# Patient Record
Sex: Female | Born: 1935 | Race: White | Hispanic: No | Smoking: Former smoker
Health system: Southern US, Community
[De-identification: ages and names within clinical notes are randomized; demographics above are authoritative.]

## PROBLEM LIST (undated history)

## (undated) DIAGNOSIS — N950 Postmenopausal bleeding: Secondary | ICD-10-CM

## (undated) DIAGNOSIS — C4491 Basal cell carcinoma of skin, unspecified: Secondary | ICD-10-CM

## (undated) DIAGNOSIS — I272 Pulmonary hypertension, unspecified: Secondary | ICD-10-CM

## (undated) DIAGNOSIS — G4700 Insomnia, unspecified: Secondary | ICD-10-CM

## (undated) DIAGNOSIS — N952 Postmenopausal atrophic vaginitis: Secondary | ICD-10-CM

## (undated) DIAGNOSIS — G629 Polyneuropathy, unspecified: Secondary | ICD-10-CM

## (undated) DIAGNOSIS — H9190 Unspecified hearing loss, unspecified ear: Secondary | ICD-10-CM

## (undated) DIAGNOSIS — K5900 Constipation, unspecified: Secondary | ICD-10-CM

## (undated) DIAGNOSIS — K219 Gastro-esophageal reflux disease without esophagitis: Secondary | ICD-10-CM

## (undated) DIAGNOSIS — R3129 Other microscopic hematuria: Secondary | ICD-10-CM

## (undated) DIAGNOSIS — N200 Calculus of kidney: Secondary | ICD-10-CM

## (undated) DIAGNOSIS — K449 Diaphragmatic hernia without obstruction or gangrene: Secondary | ICD-10-CM

## (undated) DIAGNOSIS — G1221 Amyotrophic lateral sclerosis: Secondary | ICD-10-CM

## (undated) DIAGNOSIS — E785 Hyperlipidemia, unspecified: Secondary | ICD-10-CM

## (undated) DIAGNOSIS — H3530 Unspecified macular degeneration: Secondary | ICD-10-CM

## (undated) DIAGNOSIS — E538 Deficiency of other specified B group vitamins: Secondary | ICD-10-CM

## (undated) DIAGNOSIS — F419 Anxiety disorder, unspecified: Secondary | ICD-10-CM

## (undated) DIAGNOSIS — I10 Essential (primary) hypertension: Secondary | ICD-10-CM

## (undated) DIAGNOSIS — S82201A Unspecified fracture of shaft of right tibia, initial encounter for closed fracture: Secondary | ICD-10-CM

## (undated) DIAGNOSIS — N3281 Overactive bladder: Secondary | ICD-10-CM

## (undated) DIAGNOSIS — Z79890 Hormone replacement therapy (postmenopausal): Secondary | ICD-10-CM

## (undated) DIAGNOSIS — K5790 Diverticulosis of intestine, part unspecified, without perforation or abscess without bleeding: Secondary | ICD-10-CM

## (undated) DIAGNOSIS — T7840XA Allergy, unspecified, initial encounter: Secondary | ICD-10-CM

## (undated) DIAGNOSIS — I340 Nonrheumatic mitral (valve) insufficiency: Secondary | ICD-10-CM

## (undated) HISTORY — DX: Postmenopausal atrophic vaginitis: N95.2

## (undated) HISTORY — DX: Diaphragmatic hernia without obstruction or gangrene: K44.9

## (undated) HISTORY — DX: Gastro-esophageal reflux disease without esophagitis: K21.9

## (undated) HISTORY — DX: Unspecified macular degeneration: H35.30

## (undated) HISTORY — DX: Allergy, unspecified, initial encounter: T78.40XA

## (undated) HISTORY — DX: Calculus of kidney: N20.0

## (undated) HISTORY — DX: Nonrheumatic mitral (valve) insufficiency: I34.0

## (undated) HISTORY — DX: Basal cell carcinoma of skin, unspecified: C44.91

## (undated) HISTORY — DX: Essential (primary) hypertension: I10

## (undated) HISTORY — DX: Diverticulosis of intestine, part unspecified, without perforation or abscess without bleeding: K57.90

## (undated) HISTORY — DX: Deficiency of other specified B group vitamins: E53.8

## (undated) HISTORY — DX: Pulmonary hypertension, unspecified: I27.20

## (undated) HISTORY — DX: Polyneuropathy, unspecified: G62.9

## (undated) HISTORY — DX: Constipation, unspecified: K59.00

## (undated) HISTORY — DX: Anxiety disorder, unspecified: F41.9

## (undated) HISTORY — DX: Insomnia, unspecified: G47.00

## (undated) HISTORY — DX: Other microscopic hematuria: R31.29

## (undated) HISTORY — DX: Postmenopausal bleeding: N95.0

## (undated) HISTORY — DX: Overactive bladder: N32.81

## (undated) HISTORY — DX: Hyperlipidemia, unspecified: E78.5

## (undated) HISTORY — DX: Unspecified hearing loss, unspecified ear: H91.90

## (undated) HISTORY — PX: TONSILLECTOMY AND ADENOIDECTOMY: SUR1326

## (undated) HISTORY — DX: Unspecified fracture of shaft of right tibia, initial encounter for closed fracture: S82.201A

## (undated) HISTORY — DX: Hormone replacement therapy: Z79.890

## (undated) HISTORY — DX: Amyotrophic lateral sclerosis: G12.21

---
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
## 1991-06-16 DIAGNOSIS — N200 Calculus of kidney: Secondary | ICD-10-CM

## 1991-06-16 HISTORY — DX: Calculus of kidney: N20.0

## 1999-12-24 ENCOUNTER — Encounter: Payer: Self-pay | Admitting: Family Medicine

## 1999-12-24 ENCOUNTER — Encounter: Admission: RE | Admit: 1999-12-24 | Discharge: 1999-12-24 | Payer: Self-pay | Admitting: Family Medicine

## 2001-11-05 ENCOUNTER — Encounter: Payer: Self-pay | Admitting: Emergency Medicine

## 2001-11-05 ENCOUNTER — Emergency Department (HOSPITAL_COMMUNITY): Admission: EM | Admit: 2001-11-05 | Discharge: 2001-11-05 | Payer: Self-pay | Admitting: Emergency Medicine

## 2002-06-28 ENCOUNTER — Ambulatory Visit (HOSPITAL_COMMUNITY): Admission: RE | Admit: 2002-06-28 | Discharge: 2002-06-28 | Payer: Self-pay | Admitting: Gastroenterology

## 2003-12-18 ENCOUNTER — Encounter: Admission: RE | Admit: 2003-12-18 | Discharge: 2003-12-18 | Payer: Self-pay | Admitting: Family Medicine

## 2004-03-06 ENCOUNTER — Other Ambulatory Visit: Admission: RE | Admit: 2004-03-06 | Discharge: 2004-03-06 | Payer: Self-pay | Admitting: Family Medicine

## 2006-08-10 ENCOUNTER — Encounter: Admission: RE | Admit: 2006-08-10 | Discharge: 2006-08-10 | Payer: Self-pay | Admitting: Family Medicine

## 2006-08-26 ENCOUNTER — Encounter: Admission: RE | Admit: 2006-08-26 | Discharge: 2006-08-26 | Payer: Self-pay | Admitting: Family Medicine

## 2006-09-30 ENCOUNTER — Other Ambulatory Visit: Admission: RE | Admit: 2006-09-30 | Discharge: 2006-09-30 | Payer: Self-pay | Admitting: Family Medicine

## 2007-08-23 ENCOUNTER — Encounter: Admission: RE | Admit: 2007-08-23 | Discharge: 2007-08-23 | Payer: Self-pay | Admitting: Family Medicine

## 2008-11-30 ENCOUNTER — Encounter: Admission: RE | Admit: 2008-11-30 | Discharge: 2008-11-30 | Payer: Self-pay | Admitting: Family Medicine

## 2010-01-28 ENCOUNTER — Encounter: Admission: RE | Admit: 2010-01-28 | Discharge: 2010-01-28 | Payer: Self-pay | Admitting: *Deleted

## 2010-01-28 LAB — HM MAMMOGRAPHY: HM Mammogram: NORMAL

## 2010-07-06 ENCOUNTER — Encounter: Payer: Self-pay | Admitting: Family Medicine

## 2010-09-29 ENCOUNTER — Ambulatory Visit (INDEPENDENT_AMBULATORY_CARE_PROVIDER_SITE_OTHER): Payer: Medicare Other | Admitting: Family Medicine

## 2010-09-29 DIAGNOSIS — I10 Essential (primary) hypertension: Secondary | ICD-10-CM

## 2010-09-29 DIAGNOSIS — J069 Acute upper respiratory infection, unspecified: Secondary | ICD-10-CM

## 2010-10-31 NOTE — Op Note (Signed)
Ashley Vaughan, Ashley Vaughan                       ACCOUNT NO.:  [id number]   MEDICAL RECORD NO.:  [id number]                   PATIENT TYPE:  AMB   LOCATION:  ENDO                                 FACILITY:  [facility]   PHYSICIAN:  Anselmo Rod, M.D.               DATE OF BIRTH:  06-09-1929   DATE OF PROCEDURE:  06/28/2002  DATE OF DISCHARGE:                                 OPERATIVE REPORT   PROCEDURE PERFORMED:  Screening colonoscopy.   ENDOSCOPIST:  Charna Elizabeth, M.D.   INSTRUMENT USED:  Olympus pediatric adjustable colonoscope.   INDICATIONS FOR PROCEDURE:  The patient is a 77 year old white female who  underwent screening colonoscopy.  The patient has a questionable family  history of colon cancer in her father and a history of breast cancer in a  sister.  Rule out colonic polyps, masses, etc.   PREPROCEDURE PREPARATION:  Informed consent was procured from the patient.  The patient was fasted for eight hours prior to the procedure and prepped  with a bottle of Gatorade and MiraLax the night prior to the procedure.   PREPROCEDURE PHYSICAL:  The patient had stable vital signs.  Neck supple.  Chest clear to auscultation.  S1 and S2 regular.  Abdomen soft with normal  bowel sounds.   DESCRIPTION OF PROCEDURE:  The patient was placed in left lateral decubitus  position and sedated with 60 mcg of fentanyl and 6 mg of Versed  intravenously.  Once the patient was adequately sedated and maintained on  low flow oxygen and continuous cardiac monitoring, the Olympus video  colonoscope was advanced from the rectum to the cecum and terminal ileum  without difficulty.  A few left-sided diverticula were seen, no masses,  polyps erosions, ulcerations, etc were noted.  The terminal ileum appeared  normal and without lesions.  Retroflexion revealed no acute abnormalities.   IMPRESSION:  Left-sided diverticulosis, otherwise normal colonoscopy up to  the terminal ileum.   RECOMMENDATIONS:  1.  Considering the patient's questionable family history of colon cancer in     her father, repeat colorectal cancer screening is recommended in the next     five years.  2.     A high fiber diet has been discussed with him in great detail.  Brochures     have been given to her for her education.  Liberal fluid intake has been     advocated along with fiber supplements.  3. Outpatient follow-up on a p.r.n. basis as need arises in the future.                                                    Anselmo Rod, M.D.    JNM/MEDQ  D:  06/28/2002  T:  06/28/2002  Job:  [id number]   cc:   Mosetta Putt, M.D.  [address] [place]  [place] [address]  Fax: [phone]

## 2010-11-18 ENCOUNTER — Other Ambulatory Visit: Payer: Self-pay | Admitting: Family Medicine

## 2010-11-27 ENCOUNTER — Encounter: Payer: Self-pay | Admitting: *Deleted

## 2010-12-04 ENCOUNTER — Encounter: Payer: Self-pay | Admitting: Family Medicine

## 2010-12-04 ENCOUNTER — Ambulatory Visit (INDEPENDENT_AMBULATORY_CARE_PROVIDER_SITE_OTHER): Payer: Medicare Other | Admitting: Family Medicine

## 2010-12-04 ENCOUNTER — Ambulatory Visit: Payer: Medicare Other | Admitting: Family Medicine

## 2010-12-04 VITALS — BP 122/72 | HR 64 | Ht 62.5 in | Wt 120.0 lb

## 2010-12-04 VITALS — BP 150/90 | HR 64 | Ht 62.5 in | Wt 120.0 lb

## 2010-12-04 DIAGNOSIS — E7800 Pure hypercholesterolemia, unspecified: Secondary | ICD-10-CM

## 2010-12-04 DIAGNOSIS — Z0000 Encounter for general adult medical examination without abnormal findings: Secondary | ICD-10-CM

## 2010-12-04 DIAGNOSIS — N390 Urinary tract infection, site not specified: Secondary | ICD-10-CM

## 2010-12-04 DIAGNOSIS — E559 Vitamin D deficiency, unspecified: Secondary | ICD-10-CM

## 2010-12-04 DIAGNOSIS — M810 Age-related osteoporosis without current pathological fracture: Secondary | ICD-10-CM

## 2010-12-04 DIAGNOSIS — I10 Essential (primary) hypertension: Secondary | ICD-10-CM

## 2010-12-04 DIAGNOSIS — Z23 Encounter for immunization: Secondary | ICD-10-CM

## 2010-12-04 LAB — COMPREHENSIVE METABOLIC PANEL
ALT: <8 U/L (ref 0–35)
Alkaline Phosphatase: 45 U/L (ref 39–117)
Creat: 0.83 mg/dL (ref 0.50–1.10)
Glucose, Bld: 97 mg/dL (ref 70–99)
Sodium: 141 mEq/L (ref 135–145)

## 2010-12-04 LAB — CBC WITH DIFFERENTIAL/PLATELET
Basophils Relative: 1 % (ref 0–1)
Eosinophils Absolute: 0.1 10*3/uL (ref 0.0–0.7)
Lymphocytes Relative: 32 % (ref 12–46)
Lymphs Abs: 1.9 10*3/uL (ref 0.7–4.0)
MCH: 31.4 pg (ref 26.0–34.0)
Monocytes Absolute: 0.6 10*3/uL (ref 0.1–1.0)
Monocytes Relative: 11 % (ref 3–12)
Neutro Abs: 3.2 10*3/uL (ref 1.7–7.7)
Platelets: 205 10*3/uL (ref 150–400)
WBC: 5.8 10*3/uL (ref 4.0–10.5)

## 2010-12-04 LAB — POCT URINALYSIS DIPSTICK
Bilirubin, UA: NEGATIVE
Ketones, UA: NEGATIVE
Spec Grav, UA: 1.015
pH, UA: 6

## 2010-12-04 LAB — LIPID PANEL
LDL Cholesterol: 103 mg/dL — ABNORMAL HIGH (ref 0–99)
Total CHOL/HDL Ratio: 3.1 Ratio
Triglycerides: 109 mg/dL (ref <150)
VLDL: 22 mg/dL (ref 0–40)

## 2010-12-04 LAB — TSH: TSH: 1.847 u[IU]/mL (ref 0.350–4.500)

## 2010-12-04 LAB — POCT UA - MICROSCOPIC ONLY

## 2010-12-04 MED ORDER — SIMVASTATIN 20 MG PO TABS: 10.0000 mg | ORAL_TABLET | Freq: Every day | ORAL | Status: DC

## 2010-12-04 MED ORDER — AMLODIPINE BESYLATE 2.5 MG PO TABS: 2.5000 mg | ORAL_TABLET | Freq: Every day | ORAL | Status: DC

## 2010-12-04 MED ORDER — ATENOLOL 50 MG PO TABS: 50.0000 mg | ORAL_TABLET | Freq: Every day | ORAL | Status: DC

## 2010-12-04 NOTE — Progress Notes (Signed)
Subjective:    Patient ID: Ashley Vaughan, female    DOB: 1928-12-29, 77 y.o.   MRN: [id number]  HPI Ashley Vaughan is a 77 y.o. female who presents for a complete physical.  She has no specific concerns, other than needing her medications refilled.  See below for med check info.  Immunization History  Administered Date(s) Administered  . DTaP 09/30/2006  . Pneumococcal Polysaccharide 06/15/2000, 12/04/2010  Zostavax has been recommended in the past, she has never gotten.  Her sister has shingles now, and would like Rx to get Last Pap smear: unsure, likely >2 years ago Last mammogram: 01/2010 Last colonoscopy: 9/09 Dr. Loreta Ave Last DEXA: 11/2008 Ophtho: regularly.  Recently referred to [facility] physicians and required treatment for macular bleeding Dentist: regularly Exercise: walks 30 minutes daily  Hypertension follow-up:  Blood pressures elsewhere are 119-120/79-86 (checked by nurse at her living facility once a month).  Denies dizziness, headaches, chest pain.  Denies side effects of medications.  Hyperlipidemia follow-up:  Patient is reportedly following a low-fat, low cholesterol diet.  Compliant with medications and denies medication side effects  Osteoporosis:  Patient has declined medications in the past.  Last year Vitamin D level was slightly low.  Due for re-check DEXA and Vitamin D level  Review of Systems The patient denies anorexia, fever, weight changes, headaches, decreased hearing, ear pain, sore throat, breast concerns, chest pain, palpitations, dizziness, syncope, dyspnea on exertion, cough, swelling, nausea, vomiting, diarrhea, abdominal pain, melena, hematochezia, indigestion/heartburn, hematuria, incontinence, dysuria, no postmenopausal bleeding, vaginal discharge, odor or itch, genital lesions, joint pains, numbness, tingling, weakness, tremor, suspicious skin lesions, depression, anxiety, abnormal bleeding/bruising, or enlarged lymph nodes.  + constipation  occasionally.  Only sporadic vaginal irritation.  Denies any urinary symptoms currently.  Intermittent problems with sleeping    Objective:   Physical Exam BP 122/72  Pulse 64  Ht 5' 2.5" (1.588 m)  Wt 120 lb (54.432 kg)  BMI 21.60 kg/m2  General Appearance:    Alert, cooperative, no distress, appears stated age  Head:    Normocephalic, without obvious abnormality, atraumatic  Eyes:  PERRL, EOMI.  Mild injection of R conjunctiva, and some crusting (from eye drops).  L conjunctiva normal  Ears:    Normal TM's and external ear canals  Nose:   Nares normal, mucosa normal, no drainage or sinus   tenderness  Throat:   Lips, mucosa, and tongue normal; dentures  Neck:   Supple, no lymphadenopathy;  thyroid:  no   enlargement/tenderness/nodules; no carotid   bruit or JVD  Back:    Spine nontender, no curvature, ROM normal, no CVA     tenderness  Lungs:     Clear to auscultation bilaterally without wheezes, rales or     ronchi; respirations unlabored  Chest Wall:    No tenderness or deformity   Heart:    Regular rate and rhythm, S1 and S2 normal, no murmur, rub   or gallop.  Occasional ectopy  Breast Exam:    No tenderness, masses, or nipple discharge or inversion.      No axillary lymphadenopathy  Abdomen:     Soft, non-tender, nondistended, normoactive bowel sounds,    no masses, no hepatosplenomegaly  Genitalia:    Normal external genitalia without lesions. Atrophic changes noted. BUS normal.  Bimanual exam revealed no tenderness or masses.  Uterus normal size, adnexa not palpable.  Pap not performed due to age  Rectal:    Normal tone, no masses or tenderness; guaiac  negative stool  Extremities:   No clubbing, cyanosis or edema  Pulses:   2+ and symmetric all extremities. + varicose veins bilaterally  Skin:   Skin color, texture, turgor normal, no rashes or lesions  Lymph nodes:   Cervical, supraclavicular, and axillary nodes normal  Neurologic:   CNII-XII intact, normal strength, sensation  and gait; reflexes 2+ and symmetric throughout          Psych:   Normal mood, affect, hygiene and grooming.       Assessment & Plan:   1. Essential hypertension, benign  Comprehensive metabolic panel, CBC with Differential, amLODipine (NORVASC) 2.5 MG tablet, atenolol (TENORMIN) 50 MG tablet  2. Pure hypercholesterolemia  Lipid panel, simvastatin (ZOCOR) 20 MG tablet  3. Osteoporosis  DG Bone Density, TSH  4. Routine general medical examination at a health care facility  TSH  5. Unspecified vitamin D deficiency  Vitamin D 25 hydroxy  6. Need for pneumococcal vaccination  Pneumococcal polysaccharide vaccine 23-valent greater than or equal to 2yo subcutaneous/IM  7. Urinary tract infection, site not specified  Urine Culture, POCT UA - Microscopic Only   Constipation--discussed fiber and fluid intake, prn laxative such as Miralax Rx given for patient to get Zostavax at pharmacy--to wait a few weeks before getting.  Discussed monthly self breast exams and yearly mammograms after the age of 35; at least 30 minutes of aerobic activity at least 5 days/week; proper sunscreen use reviewed; healthy diet, including goals of calcium and vitamin D intake and alcohol recommendations (less than or equal to 1 drink/day) reviewed; regular seatbelt use; changing batteries in smoke detectors.  Immunization recommendations discussed--recommended Zostavax; pneumo booster given today.  Annual flu shots recommended.  Colonoscopy recommendations reviewed--UTD

## 2010-12-07 LAB — URINE CULTURE: Colony Count: >=100000

## 2010-12-08 ENCOUNTER — Telehealth: Payer: Self-pay | Admitting: *Deleted

## 2010-12-08 DIAGNOSIS — N390 Urinary tract infection, site not specified: Secondary | ICD-10-CM

## 2010-12-08 MED ORDER — CIPROFLOXACIN HCL 250 MG PO TABS: 250.0000 mg | ORAL_TABLET | Freq: Two times a day (BID) | ORAL | Status: DC

## 2010-12-08 NOTE — Telephone Encounter (Signed)
Spoke with patient, notified that her culture came back and UTI was found. Called in Cipro 250mg  #14 BID x 7days to [facility], patient said she will come in for repeat UA but will call that AM for lab appt due to transportation issues, I told her that would be fine.

## 2010-12-10 ENCOUNTER — Telehealth: Payer: Self-pay | Admitting: *Deleted

## 2010-12-10 NOTE — Telephone Encounter (Signed)
Called patient with the Dexa scan appt info, she is scheduled @ [facility] [address] 12/19/2010 @ 10:30am.

## 2010-12-22 ENCOUNTER — Other Ambulatory Visit: Payer: Medicare Other

## 2010-12-22 DIAGNOSIS — N390 Urinary tract infection, site not specified: Secondary | ICD-10-CM

## 2010-12-22 LAB — POCT URINALYSIS DIPSTICK
Protein, UA: NEGATIVE
Spec Grav, UA: 1.01
Urobilinogen, UA: NEGATIVE

## 2010-12-25 ENCOUNTER — Telehealth: Payer: Self-pay | Admitting: Family Medicine

## 2010-12-25 NOTE — Telephone Encounter (Signed)
Advise pt that urine still shows evidence of infection--I'm awaiting on final result with the antibiotic sensitivies, which should be back today.  Ashley Vaughan--please check chart for bone density report/results and recommendations

## 2010-12-25 NOTE — Telephone Encounter (Signed)
Spoke with patient and let her know that I will call her once urine culture result in finalized. Also went over bone density results with her, again she refused any biphosphonates. She did however say that she would think about the Prolia and let us know.

## 2010-12-25 NOTE — Telephone Encounter (Signed)
Pt would like call re urine results and bone density

## 2010-12-25 NOTE — Progress Notes (Signed)
  Subjective:    Patient ID: Ashley Vaughan, female    DOB: 11/26/1928, 77 y.o.   MRN: [id number]  HPI Duplicate encounter--see other note   Review of Systems     Objective:   Physical Exam        Assessment & Plan:

## 2010-12-26 ENCOUNTER — Telehealth: Payer: Self-pay | Admitting: *Deleted

## 2010-12-26 DIAGNOSIS — N390 Urinary tract infection, site not specified: Secondary | ICD-10-CM

## 2010-12-26 LAB — CULTURE, URINE COMPREHENSIVE: Colony Count: >=100000

## 2010-12-26 MED ORDER — NITROFURANTOIN MONOHYD MACRO 100 MG PO CAPS: 100.0000 mg | ORAL_CAPSULE | Freq: Two times a day (BID) | ORAL | Status: DC

## 2010-12-26 NOTE — Telephone Encounter (Addendum)
Message copied by Dorthula Perfect on Fri Dec 26, 2010  4:49 PM ------      Message from: KNAPP, EVE      Created: Fri Dec 26, 2010  4:18 PM       Advise pt that urine culture shows infection.  Last time the bacteria was Proteus, this time it is enterococcus (a different bacteria).  I recommend treatment with Macrobid 100mg , 1 po BID x 7 days #14, no refill.  Return for repeat u/a in 2 weeks.  Please ask if she is having any symptoms (she was asymptomatic with last infection). Thanks   Pt was notified of urine culture.  Sent Macrobid 100 mg 1 po BID x 7 days #14 [phone]  Pt stated that she has a little burning sensation but nothing else.  CM, LPN

## 2010-12-26 NOTE — Progress Notes (Signed)
Gave to jo 

## 2010-12-30 ENCOUNTER — Telehealth: Payer: Self-pay | Admitting: Family Medicine

## 2010-12-30 MED ORDER — AMOXICILLIN 875 MG PO TABS: 875.0000 mg | ORAL_TABLET | Freq: Two times a day (BID) | ORAL | Status: AC

## 2010-12-30 NOTE — Telephone Encounter (Signed)
Patient was notified of change of ABX--will pick up and start in the morning

## 2011-01-14 ENCOUNTER — Telehealth: Payer: Self-pay | Admitting: *Deleted

## 2011-01-14 ENCOUNTER — Other Ambulatory Visit: Payer: Medicare Other

## 2011-01-14 DIAGNOSIS — N390 Urinary tract infection, site not specified: Secondary | ICD-10-CM

## 2011-01-14 LAB — POCT URINALYSIS DIPSTICK
Glucose, UA: NEGATIVE
Spec Grav, UA: 1.01
pH, UA: 7

## 2011-01-14 MED ORDER — FLUCONAZOLE 150 MG PO TABS: 150.0000 mg | ORAL_TABLET | Freq: Once | ORAL | Status: AC

## 2011-01-14 NOTE — Telephone Encounter (Signed)
Addended byJoselyn Arrow on: 01/14/2011 05:35 PM   Modules accepted: Orders

## 2011-01-14 NOTE — Telephone Encounter (Signed)
Spoke with patient regarding her UA. Patient stated that she is asymptomatic. I let her know that you would not be sending urine for culture. Patient did however state that she has a yeast infection from the abx she took, would you be able to call her in Diflucan? [facility] uses [facility]. Thanks.

## 2011-02-04 ENCOUNTER — Telehealth: Payer: Self-pay | Admitting: Family Medicine

## 2011-02-04 NOTE — Telephone Encounter (Signed)
Please advise pt that she had Tdap in 09/2006 and she is covered

## 2011-02-04 NOTE — Telephone Encounter (Signed)
Patient notified that she had Tdap 09/30/2006 so she is covered and does not need a separate shot for whooping cough as Tdap is a combo vaccine inclusive of the whooping cough.

## 2011-06-22 ENCOUNTER — Ambulatory Visit (INDEPENDENT_AMBULATORY_CARE_PROVIDER_SITE_OTHER): Payer: Medicare Other | Admitting: Family Medicine

## 2011-06-22 ENCOUNTER — Encounter: Payer: Self-pay | Admitting: Family Medicine

## 2011-06-22 VITALS — BP 120/76 | HR 64 | Temp 97.8°F | Ht 62.5 in | Wt 122.0 lb

## 2011-06-22 DIAGNOSIS — J069 Acute upper respiratory infection, unspecified: Secondary | ICD-10-CM

## 2011-06-22 DIAGNOSIS — I10 Essential (primary) hypertension: Secondary | ICD-10-CM

## 2011-06-22 MED ORDER — AMOXICILLIN 875 MG PO TABS: 875.0000 mg | ORAL_TABLET | Freq: Two times a day (BID) | ORAL | Status: AC

## 2011-06-22 NOTE — Progress Notes (Signed)
Chief complaint:  Sore throat and runny nose since last Sunday evening (8 days ago), fever off and on HPI:  Started with sore throat and runny nose 8 days ago.  Some days she feels fine, other days she is back to feeling bad.  Nasal drainage is clear.  +postnasal drip, rare cough.  Denies shortness of breath.  Some lowgrade fevers (96-98, she states she usually runs low).  Denies sick contacts.  Has been taking tylenol and sudafed with good relief, as well as gargling with salt water.  Continues to feel fatigued, sometimes feels like her eyesight is affected (intermittently).  Supposed to have dental surgery Wed.  Past Medical History  Diagnosis Date  . Hypertension   . Osteoporosis   . Migraine (atenolol for prophylaxis)  . Macular degeneration   . Kidney stones, calcium oxalate 1993  . Pulmonary hypertension mild,mild AR,mild-mod MR,mild-mod TR  . Hematuria, microscopic DrKimbrough 1993  . Hiatal hernia   . Diverticulosis (L sided) 1994  . Hyperlipidemia   . BCC (basal cell carcinoma of skin) L cheek-DrLupton (6/09)  . Postmenopausal bleeding 2010-DrCole  . Allergy     s/p immunotherapy  . Mitral regurgitation     mild-mod; mild aortic regurgitation, and mild-mod TR    Past Surgical History  Procedure Date  . Hemorroidectomy 1953  . Tonsillectomy and adenoidectomy age 78    History   Social History  . Marital Status: Single    Spouse Name: N/A    Number of Children: N/A  . Years of Education: N/A   Occupational History  . Not on file.   Social History Main Topics  . Smoking status: Former Smoker    Quit date: 06/15/1978  . Smokeless tobacco: Never Used  . Alcohol Use: No  . Drug Use: No  . Sexually Active: Not on file   Other Topics Concern  . Not on file   Social History Narrative  . No narrative on file    Family History  Problem Relation Age of Onset  . Cirrhosis Mother   . Cancer Father     ? they took out rotten intestines  . Breast cancer Sister 44    . Cancer Brother     prostate  . Stroke Brother 75  . Stroke Sister 19    Current outpatient prescriptions:amLODipine (NORVASC) 2.5 MG tablet, Take 1 tablet (2.5 mg total) by mouth daily., Disp: 30 tablet, Rfl: 11;  aspirin 81 MG tablet, Take 81 mg by mouth daily.  , Disp: , Rfl: ;  atenolol (TENORMIN) 50 MG tablet, Take 1 tablet (50 mg total) by mouth daily., Disp: 30 tablet, Rfl: 11;  Calcium Carbonate-Vitamin D (CALCIUM + D) 600-200 MG-UNIT TABS, Take 1-2 tablets by mouth daily.  , Disp: , Rfl:  docusate sodium (COLACE) 100 MG capsule, Take 200 mg by mouth at bedtime.  , Disp: , Rfl: ;  Multiple Vitamins-Minerals (MULTIVITAMIN WITH MINERALS) tablet, Take 1 tablet by mouth daily.  , Disp: , Rfl: ;  Multiple Vitamins-Minerals (OCUVITE PO), Take 2 tablets by mouth daily.  , Disp: , Rfl: ;  Omega-3 Fatty Acids (FISH OIL) 1000 MG CAPS, Take 1-2 capsules by mouth daily.  , Disp: , Rfl:  simvastatin (ZOCOR) 20 MG tablet, Take 0.5 tablets (10 mg total) by mouth at bedtime., Disp: 15 tablet, Rfl: 11;  DISCONTD: amLODipine (NORVASC) 2.5 MG tablet, TAKE 1 TABLET BY MOUTH ONCE DAILY, Disp: 30 tablet, Rfl: 3  Allergies  Allergen Reactions  . Codeine  Other (See Comments)    unknown  . Macrobid (Nitrofurantoin Monohydrate Macrocrystals) Nausea Only    Nausea and headache  . Sulfa Antibiotics Other (See Comments)    dizziness   ROS:  Denies nausea, vomiting, diarrhea, skin rashes.  Denies any significant joint pains.  Denies chest pain, but describes a little tightness/soreness this morning.  Noticed this today, at rest.  Denies exertional chest pain or shortness of breath; denies edema  PHYSICAL EXAM: BP 120/76  Pulse 64  Temp(Src) 97.8 F (36.6 C) (Oral)  Ht 5' 2.5" (1.588 m)  Wt 122 lb (55.339 kg)  BMI 21.96 kg/m2 HEENT:  PERRL, EOMI, conjunctiva clear.  TMs and EAC's normal.  OP clear.  Nasal mucosa mild-moderately edematous, with clear mucus on right, slightly yellow on left.  Sinuses  nontender Neck: no lymphadenopathy or mass Heart: regular rate and rhythm with occasional ectopy Lungs: clear with good air movement Skin: no rash Psych: normal mood, affect, hygiene and grooming  ASSESSMENT/PLAN: 1. URI (upper respiratory infection)  amoxicillin (AMOXIL) 875 MG tablet  2. Essential hypertension, benign      URI, can't r/o early sinus infection.  Supportive measures--if symptoms persist/worsen, then start ABX HTN--controlled.  Use caution with decongestants, monitor

## 2011-06-22 NOTE — Patient Instructions (Signed)
I recommend a guaifenesin-containing medicine (ie Mucinex or Robitussin--check your bottle). Consider trying Sinus Rinse Kit or Neti-pot. You need to be very careful using decongestants as they can raise your blood pressure. Please monitor blood pressure. It is safer to use an antihistamine such as either Coricidin HBP or Claritin or Zyrtec (without D) as these won't raise your blood pressure.  If you aren't feeling any better, or if you are clearly worse (ongoing "fevers", discolored mucus), then you need to start antibiotics.  You must complete all the antibiotics if you start them

## 2011-10-08 ENCOUNTER — Encounter: Payer: Self-pay | Admitting: Family Medicine

## 2011-10-08 ENCOUNTER — Ambulatory Visit (INDEPENDENT_AMBULATORY_CARE_PROVIDER_SITE_OTHER): Payer: Medicare Other | Admitting: Family Medicine

## 2011-10-08 VITALS — BP 140/84 | HR 60 | Temp 98.0°F | Ht 62.5 in | Wt 121.0 lb

## 2011-10-08 DIAGNOSIS — N952 Postmenopausal atrophic vaginitis: Secondary | ICD-10-CM | POA: Insufficient documentation

## 2011-10-08 DIAGNOSIS — R350 Frequency of micturition: Secondary | ICD-10-CM

## 2011-10-08 LAB — POCT URINALYSIS DIPSTICK
Bilirubin, UA: NEGATIVE
Glucose, UA: NEGATIVE
Nitrite, UA: NEGATIVE
pH, UA: 6

## 2011-10-08 MED ORDER — ESTROGENS, CONJUGATED 0.625 MG/GM VA CREA: TOPICAL_CREAM | VAGINAL | Status: DC

## 2011-10-08 NOTE — Patient Instructions (Signed)
Atrophic Vaginitis  Atrophic vaginitis is a problem of low levels of estrogen in women. This problem can happen at any age. It is most common in women who have gone through menopause ("the change").    HOW WILL I KNOW IF I HAVE THIS PROBLEM?  You may have:   Trouble with peeing (urinating), such as:   Going to the bathroom often.   A hard time holding your pee until you reach a bathroom.   Leaking pee.   Having pain when you pee.   Itching or a burning feeling.   Vaginal bleeding and spotting.   Pain during sex.   Dryness of the vagina.   A yellow, bad-smelling fluid (discharge) coming from the vagina.  HOW WILL MY DOCTOR CHECK FOR THIS PROBLEM?   During your exam, your doctor will likely find the problem.   If there is a vaginal fluid, it may be checked for infection.  HOW WILL THIS PROBLEM BE TREATED?  Keep the vulvar skin as clean as possible. Moisturizers and lubricants can help with some of the symptoms.  Estrogen replacement can help. There are 2 ways to take estrogen:   Systemic estrogen gets estrogen to your whole body. It takes many weeks or months before the symptoms get better.   You take an estrogen pill.   You use a skin patch. This is a patch that you put on your skin.   If you still have your uterus, your doctor may ask you to take a hormone. Talk to your doctor about the right medicine for you.   Estrogen cream.  This puts estrogen only at the part of your body where you apply it. The cream is put into the vagina or put on the vulvar skin. For some women, estrogen cream works faster than pills or the patch.  CAN ALL WOMEN WITH THIS PROBLEM USE ESTROGEN?  No. Women with certain types of cancer, liver problems, or problems with blood clots should not take estrogen. Your doctor can help you decide the best treatment for your symptoms.  Document Released: 11/18/2007 Document Revised: 05/21/2011 Document Reviewed: 11/18/2007  [employer] Patient Information 2012 [employer].

## 2011-10-08 NOTE — Progress Notes (Signed)
Chief complaint: burning and frequency with urination. Having some vaginal dryness and itching x 1 month  HPI:  Patient has been seen for similar symptoms in the past, last in 11/2009, diagnosed with atrophic vaginitis, and treated with topical estrogens.  Was doing well until about a month ago, when she started having recurrent symptoms. Was having some external vaginal burning at nighttime, seemed fine during the day, but symptoms got more severe over the last week.    Denies any abdominal pain, urgency, has some slight increase in urinary frequency.  There is burning externally when she voids (no internal burning).  Hasn't used vaginal premarin in at least a year and a half.  Past Medical History  Diagnosis Date  . Hypertension   . Osteoporosis   . Migraine (atenolol for prophylaxis)  . Macular degeneration   . Kidney stones, calcium oxalate 1993  . Pulmonary hypertension mild,mild AR,mild-mod MR,mild-mod TR  . Hematuria, microscopic DrKimbrough 1993  . Hiatal hernia   . Diverticulosis (L sided) 1994  . Hyperlipidemia   . BCC (basal cell carcinoma of skin) L cheek-DrLupton (6/09)  . Postmenopausal bleeding 2010-DrCole  . Allergy     s/p immunotherapy  . Mitral regurgitation     mild-mod; mild aortic regurgitation, and mild-mod TR   Past Surgical History  Procedure Date  . Hemorroidectomy 1953  . Tonsillectomy and adenoidectomy age 21   Current Outpatient Prescriptions on File Prior to Visit  Medication Sig Dispense Refill  . amLODipine (NORVASC) 2.5 MG tablet Take 1 tablet (2.5 mg total) by mouth daily.  30 tablet  11  . aspirin 81 MG tablet Take 81 mg by mouth daily.        Marland Kitchen atenolol (TENORMIN) 50 MG tablet Take 1 tablet (50 mg total) by mouth daily.  30 tablet  11  . Calcium Carbonate-Vitamin D (CALCIUM + D) 600-200 MG-UNIT TABS Take 1-2 tablets by mouth daily.        Marland Kitchen docusate sodium (COLACE) 100 MG capsule Take 200 mg by mouth at bedtime.        . Multiple  Vitamins-Minerals (MULTIVITAMIN WITH MINERALS) tablet Take 1 tablet by mouth daily.        . Multiple Vitamins-Minerals (OCUVITE PO) Take 2 tablets by mouth daily.        . Omega-3 Fatty Acids (FISH OIL) 1000 MG CAPS Take 1-2 capsules by mouth daily.        . simvastatin (ZOCOR) 20 MG tablet Take 0.5 tablets (10 mg total) by mouth at bedtime.  15 tablet  11   Allergies  Allergen Reactions  . Codeine Other (See Comments)    unknown  . Macrobid (Nitrofurantoin Monohydrate Macrocrystals) Nausea Only    Nausea and headache  . Sulfa Antibiotics Other (See Comments)    dizziness    ROS: Denies any rash, fevers, nausea, vomiting, diarrhea.  Denies vaginal bleeding/spotting. No skin rash, fevers, URI symptoms, shortness of breath or other concerns  PHYSICAL EXAM: BP 140/84  Pulse 60  Temp(Src) 98 F (36.7 C) (Oral)  Ht 5' 2.5" (1.588 m)  Wt 121 lb (54.885 kg)  BMI 21.78 kg/m2 Well developed, pleasant, elderly female in no distress External genitalia--atrophic changes. No erythema, lesions Bimanual exam--normal, without masses or tenderness  Urine dip--trace blood and trace leuks  ASSESSMENT/PLAN: 1. Urinary frequency  POCT Urinalysis Dipstick  2. Atrophic vaginitis  conjugated estrogens (PREMARIN) vaginal cream    1/2 gram vaginally twice weekly,as was previously rx'd by Dr. Gerald Leitz.  F/u prn persistence/worsening of symptoms, or if other urinary complaints develop

## 2011-12-02 ENCOUNTER — Other Ambulatory Visit: Payer: Self-pay | Admitting: Family Medicine

## 2011-12-24 ENCOUNTER — Other Ambulatory Visit: Payer: Self-pay | Admitting: Family Medicine

## 2011-12-28 ENCOUNTER — Other Ambulatory Visit: Payer: Self-pay | Admitting: Family Medicine

## 2012-01-04 ENCOUNTER — Other Ambulatory Visit: Payer: Medicare Other

## 2012-01-04 ENCOUNTER — Telehealth: Payer: Self-pay | Admitting: *Deleted

## 2012-01-04 DIAGNOSIS — Z79899 Other long term (current) drug therapy: Secondary | ICD-10-CM

## 2012-01-04 DIAGNOSIS — I10 Essential (primary) hypertension: Secondary | ICD-10-CM

## 2012-01-04 MED ORDER — ATENOLOL 50 MG PO TABS: 50.0000 mg | ORAL_TABLET | Freq: Every day | ORAL | Status: DC

## 2012-01-04 NOTE — Telephone Encounter (Signed)
c-met and lipids (v58.69) is all she needs this year.

## 2012-01-04 NOTE — Telephone Encounter (Signed)
Ordered.  Thank you.

## 2012-01-04 NOTE — Telephone Encounter (Signed)
Patient was in this am for fasting labs for her CPE, there were no orders in system, can you please let me know that she needs so I can order. Thanks.

## 2012-01-05 LAB — LIPID PANEL
HDL: 54 mg/dL (ref >39)
Total CHOL/HDL Ratio: 2.9 Ratio
Triglycerides: 87 mg/dL (ref <150)

## 2012-01-05 LAB — COMPREHENSIVE METABOLIC PANEL
ALT: <8 U/L (ref 0–35)
AST: 13 U/L (ref 0–37)
Calcium: 9 mg/dL (ref 8.4–10.5)
Chloride: 104 mEq/L (ref 96–112)
Creat: 0.75 mg/dL (ref 0.50–1.10)
Glucose, Bld: 88 mg/dL (ref 70–99)
Sodium: 142 mEq/L (ref 135–145)
Total Bilirubin: 0.4 mg/dL (ref 0.3–1.2)

## 2012-01-06 ENCOUNTER — Ambulatory Visit (INDEPENDENT_AMBULATORY_CARE_PROVIDER_SITE_OTHER): Payer: Medicare Other | Admitting: Family Medicine

## 2012-01-06 ENCOUNTER — Encounter: Payer: Self-pay | Admitting: Family Medicine

## 2012-01-06 VITALS — BP 132/74 | HR 72 | Ht 61.0 in | Wt 120.0 lb

## 2012-01-06 DIAGNOSIS — Z0000 Encounter for general adult medical examination without abnormal findings: Secondary | ICD-10-CM

## 2012-01-06 DIAGNOSIS — N952 Postmenopausal atrophic vaginitis: Secondary | ICD-10-CM

## 2012-01-06 DIAGNOSIS — E7800 Pure hypercholesterolemia, unspecified: Secondary | ICD-10-CM

## 2012-01-06 DIAGNOSIS — M810 Age-related osteoporosis without current pathological fracture: Secondary | ICD-10-CM

## 2012-01-06 DIAGNOSIS — I10 Essential (primary) hypertension: Secondary | ICD-10-CM

## 2012-01-06 LAB — POCT URINALYSIS DIPSTICK
Glucose, UA: NEGATIVE
Spec Grav, UA: 1.01
Urobilinogen, UA: NEGATIVE

## 2012-01-06 MED ORDER — SIMVASTATIN 20 MG PO TABS: 10.0000 mg | ORAL_TABLET | Freq: Every day | ORAL | Status: DC

## 2012-01-06 MED ORDER — ATENOLOL 50 MG PO TABS: 50.0000 mg | ORAL_TABLET | Freq: Every day | ORAL | Status: DC

## 2012-01-06 NOTE — Progress Notes (Signed)
Chief Complaint  Patient presents with  . Annual Exam    annual exam, labs already done. No eye exam as pt states she just had one.   Having trouble with her hands--opening medicine bottles.  Denies any pain, just hard to do--ie picking up a coin off the floor, buttoning a coat. Denies swelling or pain, no significant stiffness.  Med check/follow-up on chronic medical problems:  Hypertension follow-up: Blood pressures elsewhere are 120-140/60-75 (checked by nurse at her living facility once a month). Denies dizziness, headaches, chest pain. Denies side effects of medications.   Hyperlipidemia follow-up: Patient is reportedly following a low-fat, low cholesterol diet. Compliant with medications and denies medication side effects   Osteoporosis: Patient has declined medications in the past. Tried Fosamax twice, but stopped both times due to jaw pain.  Atrophic vaginitis--much improved since restarting the vaginal premarin cream.  Using 0.5 gram twice weekly.  Denies vaginal bleeding, spotting, dysuria, hematuria.   Health maintenance: Immunization History  Administered Date(s) Administered  . Pneumococcal Polysaccharide 06/15/2000, 12/04/2010  . Tdap 09/30/2006  . Zoster 01/27/2011  didn't get flu shot last year Last Pap smear: at least 3 years ago Last mammogram: 01/2010 Last colonoscopy: 9/09 Dr. Loreta Ave Last DEXA: 12/19/10 at [facility].  T-2.9 in spine, with statistically significant loss in R hip compared to 11/2008 Ophtho:  Getting injections (last 11/2011) for macular bleeding through [facility]ist: regularly Exercise: walks 30 minutes at least 5 days/week  Past Medical History  Diagnosis Date  . Hypertension   . Osteoporosis   . Migraine (atenolol for prophylaxis)  . Macular degeneration   . Kidney stones, calcium oxalate 1993  . Pulmonary hypertension mild,mild AR,mild-mod MR,mild-mod TR  . Hematuria, microscopic DrKimbrough 1993  . Hiatal hernia   . Diverticulosis (L sided) 1994    . Hyperlipidemia   . BCC (basal cell carcinoma of skin) L cheek-DrLupton (6/09)  . Postmenopausal bleeding 2010-DrCole  . Allergy     s/p immunotherapy  . Mitral regurgitation     mild-mod; mild aortic regurgitation, and mild-mod TR  . Atrophic vaginitis   . Hearing loss     right ear    Past Surgical History  Procedure Date  . Hemorroidectomy 1953  . Tonsillectomy and adenoidectomy age 58    History   Social History  . Marital Status: Single    Spouse Name: N/A    Number of Children: 1  . Years of Education: N/A   Occupational History  . retired Art therapist work)    Social History Main Topics  . Smoking status: Former Smoker    Quit date: 06/15/1978  . Smokeless tobacco: Never Used  . Alcohol Use: No  . Drug Use: No  . Sexually Active: Not on file   Other Topics Concern  . Not on file   Social History Narrative   Lives alone, in [facility].  Son and his family live in [place].     Family History  Problem Relation Age of Onset  . Cirrhosis Mother   . Cancer Father     ? they took out rotten intestines  . Cancer Brother     prostate  . Stroke Brother   . Stroke Brother 28  . Stroke Sister 7  . Breast cancer Sister 68    breast cancer  . Cancer Sister 61    breast cancer  . Heart disease Neg Hx   . Diabetes Neg Hx    Current Outpatient Prescriptions on File Prior to Visit  Medication Sig Dispense Refill  . amLODipine (NORVASC) 2.5 MG tablet TAKE 1 TABLET EVERY DAY  30 tablet  8  . aspirin 81 MG tablet Take 81 mg by mouth daily.        . Calcium Carbonate-Vitamin D (CALCIUM + D) 600-200 MG-UNIT TABS Take 1-2 tablets by mouth daily.        Marland Kitchen conjugated estrogens (PREMARIN) vaginal cream Apply 1/2 gram vaginally twice weekly  45 g  1  . Multiple Vitamins-Minerals (MULTIVITAMIN WITH MINERALS) tablet Take 1 tablet by mouth daily.        . Multiple Vitamins-Minerals (OCUVITE PO) Take 2 tablets by mouth daily.        . Omega-3 Fatty Acids (FISH OIL)  1000 MG CAPS Take 1-2 capsules by mouth daily.        Marland Kitchen DISCONTD: atenolol (TENORMIN) 50 MG tablet Take 1 tablet (50 mg total) by mouth daily.  30 tablet  0  . DISCONTD: simvastatin (ZOCOR) 20 MG tablet TAKE 1/2 TABLET AT BEDTIME  15 tablet  1   Allergies  Allergen Reactions  . Codeine Other (See Comments)    unknown  . Macrobid (Nitrofurantoin Monohydrate Macrocrystals) Nausea Only    Nausea and headache  . Sulfa Antibiotics Other (See Comments)    dizziness   Review of Systems:  The patient denies anorexia, fever, weight changes, headaches, ear pain, sore throat, breast concerns, chest pain, palpitations, dizziness, syncope, dyspnea on exertion, cough, swelling, nausea, vomiting, diarrhea, abdominal pain, melena, hematochezia, indigestion/heartburn, hematuria, incontinence, dysuria, no postmenopausal bleeding, vaginal discharge, odor or itch, genital lesions, joint pains, numbness, tingling, weakness, tremor, suspicious skin lesions, depression, anxiety, abnormal bleeding/bruising, or enlarged lymph nodes.   + constipation occasionally.  Intermittent problems with sleeping.  Rare dizziness. +chronic hearing loss R ear.  PHYSICAL EXAM: BP 132/74  Pulse 72  Ht 5\' 1"  (1.549 m)  Wt 120 lb (54.432 kg)  BMI 22.67 kg/m2 General Appearance:  Alert, cooperative, no distress, appears stated age   Head:  Normocephalic, without obvious abnormality, atraumatic   Eyes:  PERRL, EOMI. Conjunctiva clear.  Fundi without hemorrhage/exudate  Ears:  Normal TM's and external ear canals   Nose:  Nares normal, mucosa normal, no drainage or sinus tenderness   Throat:  Lips, mucosa, and tongue normal; dentures   Neck:  Supple, no lymphadenopathy; thyroid: no enlargement/tenderness/nodules; no carotid  bruit or JVD   Back:  Spine nontender, no curvature, ROM normal, no CVA tenderness   Lungs:  Clear to auscultation bilaterally without wheezes, rales or ronchi; respirations unlabored   Chest Wall:  No  tenderness or deformity   Heart:  Regular rate and rhythm, S1 and S2 normal, no murmur, rub  or gallop. Occasional ectopy   Breast Exam:  No tenderness, masses, or nipple discharge or inversion. No axillary lymphadenopathy   Abdomen:  Soft, non-tender, nondistended, normoactive bowel sounds,  no masses, no hepatosplenomegaly   Genitalia:  Normal external genitalia without lesions. Atrophic changes have improved since last exam. BUS normal. Bimanual exam revealed no tenderness or masses. Uterus normal size, adnexa not palpable. Pap not performed due to age   Rectal:  Normal tone, no masses or tenderness; guaiac negative stool   Extremities:  No clubbing, cyanosis or edema   Pulses:  2+ and symmetric all extremities. + varicose veins bilaterally.  Thickened, onychomycotic toenails (painted)   Skin:  Skin color, texture, turgor normal, no rashes or lesions   Lymph nodes:  Cervical, supraclavicular, and axillary nodes  normal   Neurologic:  CNII-XII intact, normal strength, sensation and gait; reflexes 2+ and symmetric throughout   Psych: Normal mood, affect, hygiene and grooming.   Lab Results  Component Value Date   CHOL 155 01/04/2012   HDL 54 01/04/2012   LDLCALC 84 01/04/2012   TRIG 87 01/04/2012   CHOLHDL 2.9 01/04/2012     Chemistry      Component Value Date/Time   NA 142 01/04/2012 1435   K 4.0 01/04/2012 1435   CL 104 01/04/2012 1435   CO2 30 01/04/2012 1435   BUN 12 01/04/2012 1435   CREATININE 0.75 01/04/2012 1435      Component Value Date/Time   CALCIUM 9.0 01/04/2012 1435   ALKPHOS 45 01/04/2012 1435   AST 13 01/04/2012 1435   ALT <8 01/04/2012 1435   BILITOT 0.4 01/04/2012 1435    Glucose 88   ASSESSMENT/PLAN: 1. Routine general medical examination at a health care facility  POCT Urinalysis Dipstick  2. Atrophic vaginitis    3. Essential hypertension, benign  atenolol (TENORMIN) 50 MG tablet  4. Pure hypercholesterolemia  simvastatin (ZOCOR) 20 MG tablet  5. Osteoporosis      HTN--controlled. Hyperlipidemia--well controlled on current medications.  Osteoporosis--Reviewed DEXA results from last year, and discussed risks of untreated osteoporosis.  Briefly reviewed available med options (other than bisphosphonates, which she doesn't tolerate due to jaw pain).  Declines any treatment at this time; prefers to recheck DEXA next year.  Might consider meds if worsening.  Discussed importance of calcium, vitamin D and regular weight-bearing exercise (including upper extremities)  Has medical power of attorney papers at home.  Declined filling out MOST form, but it was briefly reviewed.  Doesn't want prolonged measures, but initial resuscitation would be okay.  Discussed monthly self breast exams and yearly mammograms--reminded to schedule appt; at least 30 minutes of aerobic activity at least 5 days/week; proper sunscreen use reviewed; healthy diet, including goals of calcium and vitamin D intake and alcohol recommendations (less than or equal to 1 drink/day) reviewed; regular seatbelt use; changing batteries in smoke detectors.  Immunization recommendations discussed--yearly flu shots recommended.  Colonoscopy recommendations reviewed--UTD.

## 2012-01-06 NOTE — Patient Instructions (Signed)
HEALTH MAINTENANCE RECOMMENDATIONS:  It is recommended that you get at least 30 minutes of aerobic exercise at least 5 days/week (for weight loss, you may need as much as 60-90 minutes). This can be any activity that gets your heart rate up. This can be divided in 10-15 minute intervals if needed, but try and build up your endurance at least once a week.  Weight bearing exercise is also recommended twice weekly.  Eat a healthy diet with lots of vegetables, fruits and fiber.  "Colorful" foods have a lot of vitamins (ie green vegetables, tomatoes, red peppers, etc).  Limit sweet tea, regular sodas and alcoholic beverages, all of which has a lot of calories and sugar.  Up to 1 alcoholic drink daily may be beneficial for women (unless trying to lose weight, watch sugars).  Drink a lot of water.  Calcium recommendations are 1200-1500 mg daily (1500 mg for postmenopausal women or women without ovaries), and vitamin D 1000 IU daily.  This should be obtained from diet and/or supplements (vitamins), and calcium should not be taken all at once, but in divided doses.  Monthly self breast exams and yearly mammograms for women over the age of 48 is recommended.  Sunscreen of at least SPF 30 should be used on all sun-exposed parts of the skin when outside between the hours of 10 am and 4 pm (not just when at beach or pool, but even with exercise, golf, tennis, and yard work!)  Use a sunscreen that says "broad spectrum" so it covers both UVA and UVB rays, and make sure to reapply every 1-2 hours.  Remember to change the batteries in your smoke detectors when changing your clock times in the spring and fall.  Use your seat belt every time you are in a car, and please drive safely and not be distracted with cell phones and texting while driving.  You are past due for your mammogram.  Please call [facility] to schedule routine screening mammogram.

## 2012-02-02 ENCOUNTER — Other Ambulatory Visit: Payer: Self-pay | Admitting: Family Medicine

## 2012-06-20 ENCOUNTER — Ambulatory Visit (INDEPENDENT_AMBULATORY_CARE_PROVIDER_SITE_OTHER): Payer: Medicare Other | Admitting: Family Medicine

## 2012-06-20 ENCOUNTER — Encounter: Payer: Self-pay | Admitting: Family Medicine

## 2012-06-20 VITALS — BP 154/90 | HR 72 | Ht 61.0 in | Wt 117.0 lb

## 2012-06-20 DIAGNOSIS — R209 Unspecified disturbances of skin sensation: Secondary | ICD-10-CM

## 2012-06-20 DIAGNOSIS — R238 Other skin changes: Secondary | ICD-10-CM

## 2012-06-20 DIAGNOSIS — Z4802 Encounter for removal of sutures: Secondary | ICD-10-CM

## 2012-06-20 NOTE — Patient Instructions (Addendum)
Suture Removal Your caregiver has removed your sutures today. If skin adhesive strips were applied at the time of suturing, or applied following removal of the sutures today, they will begin to peel off in a couple more days. If skin adhesive strips remain after 14 days, they may be removed. HOME CARE INSTRUCTIONS    Change any bandages (dressings) at least once a day or as directed by your caregiver. If the bandage sticks, soak it off with warm, soapy water.   Wash the area with soap and water to remove all the cream or ointment (if you were instructed to use any) 2 times a day. Rinse off the soap and pat the area dry with a clean towel.   Reapply cream or ointment as directed by your caregiver. This will help prevent infection and keep the bandage from sticking.   Keep the wound area dry and clean. If the bandage becomes wet, dirty, or develops a bad smell, change it as soon as possible.   Only take over-the-counter or prescription medicines for pain, discomfort, or fever as directed by your caregiver.   Use sunscreen when out in the sun. New scars become sunburned easily.   Return to your caregivers office in in 7 days or as directed to have your sutures removed.  You may need a tetanus shot if:  You cannot remember when you had your last tetanus shot.   You have never had a tetanus shot.   The injury broke your skin.  If you got a tetanus shot, your arm may swell, get red, and feel warm to the touch. This is common and not a problem. If you need a tetanus shot and you choose not to have one, there is a rare chance of getting tetanus. Sickness from tetanus can be serious. SEEK IMMEDIATE MEDICAL CARE IF:    There is redness, swelling, or increasing pain in the wound.   Pus is coming from the wound.   An unexplained oral temperature above 102 F (38.9 C) develops.   You notice a bad smell coming from the wound or dressing.   The wound breaks open (edges not staying together)  after sutures have been removed.  Document Released: 02/24/2001 Document Revised: 08/24/2011 Document Reviewed: 04/25/2007 [employer] Patient Information 2013 [employer].    
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
## 2012-08-02 ENCOUNTER — Other Ambulatory Visit: Payer: Self-pay | Admitting: Family Medicine

## 2012-08-24 ENCOUNTER — Encounter: Payer: Self-pay | Admitting: Family Medicine

## 2012-08-24 ENCOUNTER — Ambulatory Visit (INDEPENDENT_AMBULATORY_CARE_PROVIDER_SITE_OTHER): Payer: Medicare Other | Admitting: Family Medicine

## 2012-08-24 VITALS — BP 124/60 | HR 60 | Ht 61.0 in | Wt 119.0 lb
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
## 2012-08-24 NOTE — Patient Instructions (Addendum)
Take Prilosec OTC (Omeprazole 20mg ) once daily for up to 2 weeks.  Let us know if symptoms do NOT resolve while taking this medication.  If resolves, but recurs after discontinuing, then call for prescription.   Avoid ibuprofen.  Use acetaminophen (tylenol) as needed for pain.  Return/call if increasing pain, vomiting up coffee-ground looking vomit; black, tarry or bloody stools.  Diet for Gastroesophageal Reflux Disease, Adult Reflux (acid reflux) is when acid from your stomach flows up into the esophagus. When acid comes in contact with the esophagus, the acid causes irritation and soreness (inflammation) in the esophagus. When reflux happens often or so severely that it causes damage to the esophagus, it is called gastroesophageal reflux disease (GERD). Nutrition therapy can help ease the discomfort of GERD. FOODS OR DRINKS TO AVOID OR LIMIT  Smoking or chewing tobacco. Nicotine is one of the most potent stimulants to acid production in the gastrointestinal tract.  Caffeinated and decaffeinated coffee and black tea.  Regular or low-calorie carbonated beverages or energy drinks (caffeine-free carbonated beverages are allowed).   Strong spices, such as black pepper, white pepper, red pepper, cayenne, curry powder, and chili powder.  Peppermint or spearmint.  Chocolate.  High-fat foods, including meats and fried foods. Extra added fats including oils, butter, salad dressings, and nuts. Limit these to less than 8 tsp per day.  Fruits and vegetables if they are not tolerated, such as citrus fruits or tomatoes.  Alcohol.  Any food that seems to aggravate your condition. If you have questions regarding your diet, call your caregiver or a registered dietitian. OTHER THINGS THAT MAY HELP GERD INCLUDE:   Eating your meals slowly, in a relaxed setting.  Eating 5 to 6 small meals per day instead of 3 large meals.  Eliminating food for a period of time if it causes distress.  Not lying  down until 3 hours after eating a meal.  Keeping the head of your bed raised 6 to 9 inches (15 to 23 cm) by using a foam wedge or blocks under the legs of the bed. Lying flat may make symptoms worse.  Being physically active. Weight loss may be helpful in reducing reflux in overweight or obese adults.  Wear loose fitting clothing EXAMPLE MEAL PLAN This meal plan is approximately 2,000 calories based on https://www.bernard.org/ meal planning guidelines. Breakfast   cup cooked oatmeal.  1 cup strawberries.  1 cup low-fat milk.  1 oz almonds. Snack  1 cup cucumber slices.  6 oz yogurt (made from low-fat or fat-free milk). Lunch  2 slice whole-wheat bread.  2 oz sliced [place].  2 tsp mayonnaise.  1 cup blueberries.  1 cup snap peas. Snack  6 whole-wheat crackers.  1 oz string cheese. Dinner   cup brown rice.  1 cup mixed veggies.  1 tsp olive oil.  3 oz grilled fish. Document Released: 06/01/2005 Document Revised: 08/24/2011 Document Reviewed: 04/17/2011 [facility] Patient Information 2013 [place], [place].

## 2012-09-22 ENCOUNTER — Other Ambulatory Visit: Payer: Self-pay | Admitting: Family Medicine

## 2012-10-20 ENCOUNTER — Encounter: Payer: Self-pay | Admitting: Family Medicine

## 2012-10-20 ENCOUNTER — Ambulatory Visit (INDEPENDENT_AMBULATORY_CARE_PROVIDER_SITE_OTHER): Payer: Medicare Other | Admitting: Family Medicine

## 2012-10-20 VITALS — BP 132/74 | HR 72 | Temp 98.3°F | Ht 61.0 in | Wt 112.0 lb

## 2012-10-20 DIAGNOSIS — R300 Dysuria: Secondary | ICD-10-CM

## 2012-10-20 DIAGNOSIS — N952 Postmenopausal atrophic vaginitis: Secondary | ICD-10-CM

## 2012-10-20 LAB — POCT URINALYSIS DIPSTICK
Ketones, UA: NEGATIVE
Nitrite, UA: NEGATIVE
Protein, UA: NEGATIVE
Urobilinogen, UA: NEGATIVE

## 2012-10-20 MED ORDER — ESTROGENS, CONJUGATED 0.625 MG/GM VA CREA: TOPICAL_CREAM | VAGINAL | Status: DC

## 2012-10-20 NOTE — Patient Instructions (Signed)
Complete the antibiotics you were prescribed, since the urgency and frequency has improved since taking them. I think your burning and pain is related to atrophy/menopausal changes and will improve with restarting the premarin cream.  Use externally, around the urethral (where you hurt) nightly for the next few days up to a week, and then continue 2x/week (longterm if needed)  Return in 1-2 weeks for recheck if your symptoms do not resolve (no need to return if you are better)

## 2012-10-20 NOTE — Progress Notes (Signed)
Chief Complaint  Patient presents with  . Urinary Tract Infection    burning with urination, also vaginal itching and dryness. Was seen at [facility] on Sunday, started nitrofurantoin 100mg  BID -started this medication Tues and took Wed as well. Last dose 7pm-did not take today. Is not feeling any better.   Symptoms started 5 days ago, and 4 days ago felt worse--burning with urination, frequency and urgency.  Urine test at [facility] didn't show much, so it was sent for culture and she was given ABX to start if symtpoms worsened.  Symptoms got worse by 2 days ago, so started the macrodantin.  Symptoms were worse last night, so patient presents today for f/u.  She was called yesterday with results of culture, stating it showed mixed bacteria.  Today she is complaining of burning and stinging in the vaginal area.  Denies abdominal pain or flank pain.  Urgency and frequency has improved since starting the antibiotics, but the burning and stinging persists.  She hasn't been using premarin vaginal cream recently.  She previously was given that for itching in the vaginal area, for treatment of atrophic vaginitis.  Past Medical History  Diagnosis Date  . Hypertension   . Osteoporosis   . Migraine (atenolol for prophylaxis)  . Macular degeneration   . Kidney stones, calcium oxalate 1993  . Pulmonary hypertension mild,mild AR,mild-mod MR,mild-mod TR  . Hematuria, microscopic DrKimbrough 1993  . Hiatal hernia   . Diverticulosis (L sided) 1994  . Hyperlipidemia   . BCC (basal cell carcinoma of skin) L cheek-DrLupton (6/09)  . Postmenopausal bleeding 2010-DrCole  . Allergy     s/p immunotherapy  . Mitral regurgitation     mild-mod; mild aortic regurgitation, and mild-mod TR  . Atrophic vaginitis   . Hearing loss     right ear   Past Surgical History  Procedure Laterality Date  . Hemorroidectomy  1953  . Tonsillectomy and adenoidectomy  age 62   History   Social History  . Marital Status: Single     Spouse Name: N/A    Number of Children: 1  . Years of Education: N/A   Occupational History  . retired Art therapist work)    Social History Main Topics  . Smoking status: Former Smoker    Quit date: 06/15/1978  . Smokeless tobacco: Never Used  . Alcohol Use: No  . Drug Use: No  . Sexually Active: Not on file   Other Topics Concern  . Not on file   Social History Narrative   Lives alone, in [facility].  Son and his family live in [place].    Current Outpatient Prescriptions on File Prior to Visit  Medication Sig Dispense Refill  . amLODipine (NORVASC) 2.5 MG tablet TAKE 1 TABLET EVERY DAY  30 tablet  3  . aspirin 81 MG tablet Take 81 mg by mouth daily.        Marland Kitchen atenolol (TENORMIN) 50 MG tablet Take 1 tablet (50 mg total) by mouth daily.  30 tablet  11  . Calcium Carbonate-Vitamin D (CALCIUM + D) 600-200 MG-UNIT TABS Take 1-2 tablets by mouth daily.        . Multiple Vitamins-Minerals (MULTIVITAMIN WITH MINERALS) tablet Take 1 tablet by mouth daily.        . Multiple Vitamins-Minerals (OCUVITE PO) Take 2 tablets by mouth daily.        . Omega-3 Fatty Acids (FISH OIL) 1000 MG CAPS Take 1-2 capsules by mouth daily.        Marland Kitchen  simvastatin (ZOCOR) 20 MG tablet Take 0.5 tablets (10 mg total) by mouth at bedtime.  15 tablet  11   No current facility-administered medications on file prior to visit.   Allergies  Allergen Reactions  . Codeine Other (See Comments)    unknown  . Macrobid (Nitrofurantoin Monohydrate Macrocrystals) Nausea Only    Nausea and headache  . Sulfa Antibiotics Other (See Comments)    dizziness   ROS: Denies hematuria, flank pain, fevers, chills, nausea, vomiting, or other complaints. No bleeding/bruising, rashes, URI symptoms, chest pain, shortness of breath.  PHYSICAL EXAM: BP 132/74  Pulse 72  Temp(Src) 98.3 F (36.8 C) (Oral)  Ht 5\' 1"  (1.549 m)  Wt 112 lb (50.803 kg)  BMI 21.17 kg/m2 Pleasant elderly female, in no distress Back: no CVA  tenderness Abdomen: soft, nontender External genitalia:  Without rashes or lesions.  Atrophic changes noted.  Area of discomfort is at urethra/periurethral region.  No lesions, rashes, erythema noted  Urine dip: 1+ blood, trace leukocytes  ASSESSMENT/PLAN:  Burning with urination - Plan: POCT Urinalysis Dipstick  Atrophic vaginitis - Plan: conjugated estrogens (PREMARIN) vaginal cream  Dysuria--sounds like there wasn't a true infection based on culture results reported by pt, but will have her continue her antibiotics given that the urgency and frequency have improved some since starting the ABX.   Restart premarin vaginal cream--doesn't need to use applicator, but can use externally in peri-urethral area.  May use daily for a week, then use 2-3x/week as needed.   Return in 1-2 weeks for recheck if symptoms do not resolve, for further evaluation, including urine dip and culture (when off ABX)

## 2012-10-21 ENCOUNTER — Telehealth: Payer: Self-pay | Admitting: Medical

## 2012-10-21 ENCOUNTER — Other Ambulatory Visit (INDEPENDENT_AMBULATORY_CARE_PROVIDER_SITE_OTHER): Payer: Medicare Other | Admitting: Medical

## 2012-10-21 ENCOUNTER — Other Ambulatory Visit: Payer: Self-pay | Admitting: Medical

## 2012-10-21 VITALS — BP 140/60 | HR 68 | Temp 97.7°F | Resp 14

## 2012-10-21 DIAGNOSIS — R319 Hematuria, unspecified: Secondary | ICD-10-CM

## 2012-10-21 LAB — POCT URINALYSIS DIPSTICK
Leukocytes, UA: NEGATIVE
Nitrite, UA: NEGATIVE

## 2012-10-21 MED ORDER — PHENAZOPYRIDINE HCL 100 MG PO TABS: 100.0000 mg | ORAL_TABLET | Freq: Three times a day (TID) | ORAL | Status: DC | PRN

## 2012-10-21 MED ORDER — ONDANSETRON HCL 4 MG PO TABS: 4.0000 mg | ORAL_TABLET | Freq: Three times a day (TID) | ORAL | Status: DC | PRN

## 2012-10-21 NOTE — Patient Instructions (Signed)
Your symptoms and exam still suggest some residual urinary tract infection  Finish the antibiotic  Continue drinking plenty of water and drinking some cranberry juice  Use Tylenol for pain  If needed use the prescription Zofran for nausea, 1 tablet every 6 hours as needed   Or you can use OTC Emetrol for nausea.  If worse symptoms - visible blood, fever over 100, worse nausea, vomiting, back pain, etc., then get rechecked

## 2012-10-21 NOTE — Progress Notes (Signed)
a 

## 2012-10-21 NOTE — Telephone Encounter (Signed)
SAID NO FEVER SHE DIDN'T THINK & SAID WAS NAUSEOUS BUT WAS FROM ANTIBIOTIC

## 2012-10-21 NOTE — Progress Notes (Signed)
Subjective: Here for c/o right side pain, radiating to back.  This started today.  Pain is intermittent.  Took tylenol and it eased off.  Has some nausea, but taking Macrobid, been on this 4 days per Urgent Care, but this is making her nauseated to some extent.   Denies fever, no diarrhea.  Still having burning and discomfort with urination, no obvious blood. Decreased appetite due to the nausea.  Was started on premarin cream yesterday by Dr. Lynelle Doctor.  Was seen here for similar yesterday.  Currently in very little pain. Urine culture reportedly mixed flora from urgent care.  Objective: Filed Vitals:   10/21/12 1540  BP: 140/60  Pulse: 68  Temp: 97.7 F (36.5 C)  Resp: 14    General appearance: alert, no distress, WD/WN Oral cavity: MMM, no lesions Neck: supple, no lymphadenopathy, no thyromegaly, no masses Heart: RRR, normal S1, S2, no murmurs Lungs: CTA bilaterally, no wheezes, rhonchi, or rales Abdomen: +bs, soft, mild right side generalized abdominal tenderness, non distended, no masses, no hepatomegaly, no splenomegaly Pulses: 2+ symmetric, upper and lower extremities, normal cap refill  Assessment: Abdominal pain  Plan: Patient Instructions  Your symptoms and exam still suggest some residual urinary tract infection  Finish the antibiotic  Continue drinking plenty of water and drinking some cranberry juice  Use Tylenol for pain  If needed use the prescription Zofran for nausea, 1 tablet every 6 hours as needed   Or you can use OTC Emetrol for nausea.  If worse symptoms - visible blood, fever over 100, worse nausea, vomiting, back pain, etc., then get rechecked

## 2012-11-01 ENCOUNTER — Other Ambulatory Visit: Payer: Self-pay | Admitting: Family Medicine

## 2012-11-21 ENCOUNTER — Telehealth: Payer: Self-pay | Admitting: Family Medicine

## 2012-11-21 NOTE — Telephone Encounter (Signed)
Please call  Needs to schedule her mammogram and bone density, [facility] because [facility]. Dr. Lynelle Doctor gave her name and number of facility but she can not locate

## 2012-11-21 NOTE — Telephone Encounter (Signed)
Patient given telephone. 

## 2012-12-19 ENCOUNTER — Ambulatory Visit (INDEPENDENT_AMBULATORY_CARE_PROVIDER_SITE_OTHER): Payer: Medicare Other | Admitting: Family Medicine

## 2012-12-19 VITALS — BP 110/74 | HR 68 | Ht 61.0 in | Wt 111.0 lb

## 2012-12-19 DIAGNOSIS — I10 Essential (primary) hypertension: Secondary | ICD-10-CM

## 2012-12-19 DIAGNOSIS — Z79899 Other long term (current) drug therapy: Secondary | ICD-10-CM

## 2012-12-19 DIAGNOSIS — R42 Dizziness and giddiness: Secondary | ICD-10-CM

## 2012-12-19 LAB — COMPREHENSIVE METABOLIC PANEL
ALT: 8 U/L (ref 0–35)
AST: 16 U/L (ref 0–37)
Alkaline Phosphatase: 45 U/L (ref 39–117)
CO2: 28 mEq/L (ref 19–32)
Sodium: 142 mEq/L (ref 135–145)
Total Bilirubin: 0.3 mg/dL (ref 0.3–1.2)
Total Protein: 6.5 g/dL (ref 6.0–8.3)

## 2012-12-19 LAB — CBC WITH DIFFERENTIAL/PLATELET
Basophils Absolute: 0.1 10*3/uL (ref 0.0–0.1)
Basophils Relative: 1 % (ref 0–1)
Eosinophils Absolute: 0.1 10*3/uL (ref 0.0–0.7)
Eosinophils Relative: 2 % (ref 0–5)
MCH: 30.8 pg (ref 26.0–34.0)
MCHC: 33.7 g/dL (ref 30.0–36.0)
MCV: 91.5 fL (ref 78.0–100.0)
Monocytes Absolute: 0.6 10*3/uL (ref 0.1–1.0)
Platelets: 192 10*3/uL (ref 150–400)
RDW: 14.4 % (ref 11.5–15.5)
WBC: 5.7 10*3/uL (ref 4.0–10.5)

## 2012-12-19 NOTE — Patient Instructions (Addendum)
Please make sure to stay very well hydrated (especially when out and about). Drink lots of water. If your blood tests are normal, but you continue to have these episodes, you will need to be referred to the cardiologist.  Please call us and let us know that you continue to have problems.

## 2012-12-19 NOTE — Progress Notes (Signed)
Chief Complaint  Patient presents with  . Advice Only    just feels like she is going to pass out x 1 month. Has been happening more often lately.    Had episodes Tues, Wed and Sunday of last week where she felt like she might faint. Tues--she had been out to eat, shopping, and when she took a few steps after getting out of her car, she felt things start to go dark, legs felt heavy, like she was going to faint.  Lasted about a minute. The episodes on Tues and Wed happened after getting out of car.  She stood by the car and held on to it for about a minute until it passed.  Fine the rest of the day.  No associated tachycardia, palpitations, chest pain, shortness of breath.    She had another episode on Sunday, while trying to lock her door to go to church, she felt strange, not as severe as other episodes.    She is an avid walker, but recently her legs are feeling very fatigued during her walk.  Sometimes the knees hurt, but no cramping or other pain in the legs.  Some R temporal HA.  She has known macular degeneration, no significant recent visual changes.  She believes that she clenches her teeth.  Past Medical History  Diagnosis Date  . Hypertension   . Osteoporosis   . Migraine (atenolol for prophylaxis)  . Macular degeneration   . Kidney stones, calcium oxalate 1993  . Pulmonary hypertension mild,mild AR,mild-mod MR,mild-mod TR  . Hematuria, microscopic DrKimbrough 1993  . Hiatal hernia   . Diverticulosis (L sided) 1994  . Hyperlipidemia   . BCC (basal cell carcinoma of skin) L cheek-DrLupton (6/09)  . Postmenopausal bleeding 2010-DrCole  . Allergy     s/p immunotherapy  . Mitral regurgitation     mild-mod; mild aortic regurgitation, and mild-mod TR  . Atrophic vaginitis   . Hearing loss     right ear   Past Surgical History  Procedure Laterality Date  . Hemorroidectomy  1953  . Tonsillectomy and adenoidectomy  age 13   History   Social History  . Marital Status:  Single    Spouse Name: N/A    Number of Children: 1  . Years of Education: N/A   Occupational History  . retired Art therapist work)    Social History Main Topics  . Smoking status: Former Smoker    Quit date: 06/15/1978  . Smokeless tobacco: Never Used  . Alcohol Use: No  . Drug Use: No  . Sexually Active: Not on file   Other Topics Concern  . Not on file   Social History Narrative   Lives alone, in [facility]  Son and his family live in [place].    Current outpatient prescriptions:amLODipine (NORVASC) 2.5 MG tablet, TAKE 1 TABLET EVERY DAY, Disp: 30 tablet, Rfl: 3;  aspirin 81 MG tablet, Take 81 mg by mouth daily.  , Disp: , Rfl: ;  atenolol (TENORMIN) 50 MG tablet, Take 1 tablet (50 mg total) by mouth daily., Disp: 30 tablet, Rfl: 11;  Calcium Carbonate-Vitamin D (CALCIUM + D) 600-200 MG-UNIT TABS, Take 1-2 tablets by mouth daily.  , Disp: , Rfl:  Multiple Vitamins-Minerals (MULTIVITAMIN WITH MINERALS) tablet, Take 1 tablet by mouth daily.  , Disp: , Rfl: ;  Multiple Vitamins-Minerals (OCUVITE PO), Take 2 tablets by mouth daily.  , Disp: , Rfl: ;  Omega-3 Fatty Acids (FISH OIL) 1000 MG CAPS, Take 1-2  capsules by mouth daily.  , Disp: , Rfl: ;  simvastatin (ZOCOR) 20 MG tablet, Take 0.5 tablets (10 mg total) by mouth at bedtime., Disp: 15 tablet, Rfl: 11 conjugated estrogens (PREMARIN) vaginal cream, Apply 1/2 gram vaginally twice weekly, Disp: 45 g, Rfl: 1;  ondansetron (ZOFRAN) 4 MG tablet, Take 1 tablet (4 mg total) by mouth every 8 (eight) hours as needed for nausea., Disp: 20 tablet, Rfl: 0  Allergies  Allergen Reactions  . Codeine Other (See Comments)    unknown  . Macrobid (Nitrofurantoin Monohydrate Macrocrystals) Nausea Only    Nausea and headache  . Sulfa Antibiotics Other (See Comments)    dizziness   ROS:  Normal eating and drinking, normal appetite.  Denies nausea, vomiting, diarrhea.  Has intermittent runny nose, no different now.  Doesn't feel like she has a  cold or bad allergies.  Denies bleeding, bowel changes. Denies fevers, chills, bleeding/bruising.  Denies chest pain, palpitations, shortness of breath, cough.  See HPI.  PHYSICAL EXAM: BP 128/70  Pulse 68  Ht 5\' 1"  (1.549 m)  Wt 111 lb (50.349 kg)  BMI 20.98 kg/m2  Pleasant, elderly female in no distress.  Mildly anxious HEENT:  PERRL conjunctiva clear. Mildly tender at R temporalis muscle. Temporal arteries nontender.  OP clear Neck: no lymphadenopathy, thyromegaly, carotid bruit Heart: Occasional skipped beats, otherwise regular rate and rhythm Lungs: clear bilaterally Abdomen: soft, nontender Extremities: no edema, normal pulses, brisk cap refill Skin: no rashes Psych: normal mood, slightly anxious, normal hygiene and grooming Neuro: alert and oriented.  Cranial nerves intact.  Normal strength, sensation, gait  ASSESSMENT/PLAN:  Dizziness and giddiness - Plan: Comprehensive metabolic panel, CBC with Differential, Vitamin D 25 hydroxy, TSH, EKG 12-Lead  Encounter for long-term (current) use of other medications - Plan: Comprehensive metabolic panel  Essential hypertension, benign  Orthostatic--drop in BP noted, no change in heart rate.  BP's were much higher sitting and lying than her initial sitting BP was upon arrival.  EKG--unchanged from that of 09/2012.  No PAC's or PVCs noted on EKG (heard on exam). No acute ischemic changes.  Check labs.  If no reason found on labs, and if she continues to have these symptoms (despite adequate hydration), then refer to cardiology for further evaluation.  If needs referral to cardiology--try and get ground floor (avoid elevators if possible).

## 2012-12-20 ENCOUNTER — Encounter: Payer: Self-pay | Admitting: Family Medicine

## 2012-12-26 ENCOUNTER — Telehealth: Payer: Self-pay | Admitting: Family Medicine

## 2012-12-26 NOTE — Telephone Encounter (Signed)
Patient scheduled with Dr.Vyas for 01/11/13 @ 10:30. [address]. Patient aware and all records faxed to [phone].

## 2013-01-02 ENCOUNTER — Other Ambulatory Visit: Payer: Self-pay | Admitting: Family Medicine

## 2013-01-13 ENCOUNTER — Telehealth: Payer: Self-pay | Admitting: Family Medicine

## 2013-01-13 DIAGNOSIS — E7800 Pure hypercholesterolemia, unspecified: Secondary | ICD-10-CM

## 2013-01-13 NOTE — Telephone Encounter (Signed)
Orders entered for lipids (other labs were recently done)

## 2013-01-29 ENCOUNTER — Other Ambulatory Visit: Payer: Self-pay | Admitting: Family Medicine

## 2013-02-06 ENCOUNTER — Other Ambulatory Visit: Payer: Medicare Other

## 2013-02-06 ENCOUNTER — Other Ambulatory Visit: Payer: Self-pay | Admitting: Family Medicine

## 2013-02-06 DIAGNOSIS — E7800 Pure hypercholesterolemia, unspecified: Secondary | ICD-10-CM

## 2013-02-06 LAB — LIPID PANEL
Cholesterol: 140 mg/dL (ref 0–200)
LDL Cholesterol: 70 mg/dL (ref 0–99)
VLDL: 20 mg/dL (ref 0–40)

## 2013-02-08 ENCOUNTER — Ambulatory Visit (INDEPENDENT_AMBULATORY_CARE_PROVIDER_SITE_OTHER): Payer: Medicare Other | Admitting: Family Medicine

## 2013-02-08 ENCOUNTER — Encounter: Payer: Self-pay | Admitting: Family Medicine

## 2013-02-08 VITALS — BP 140/60 | HR 68 | Ht 62.0 in | Wt 110.0 lb

## 2013-02-08 DIAGNOSIS — M810 Age-related osteoporosis without current pathological fracture: Secondary | ICD-10-CM

## 2013-02-08 DIAGNOSIS — I10 Essential (primary) hypertension: Secondary | ICD-10-CM

## 2013-02-08 DIAGNOSIS — E7800 Pure hypercholesterolemia, unspecified: Secondary | ICD-10-CM

## 2013-02-08 DIAGNOSIS — M24549 Contracture, unspecified hand: Secondary | ICD-10-CM

## 2013-02-08 DIAGNOSIS — M24542 Contracture, left hand: Secondary | ICD-10-CM | POA: Insufficient documentation

## 2013-02-08 DIAGNOSIS — N952 Postmenopausal atrophic vaginitis: Secondary | ICD-10-CM

## 2013-02-08 DIAGNOSIS — Z0000 Encounter for general adult medical examination without abnormal findings: Secondary | ICD-10-CM

## 2013-02-08 MED ORDER — ATENOLOL 50 MG PO TABS: 50.0000 mg | ORAL_TABLET | Freq: Every day | ORAL | Status: DC

## 2013-02-08 NOTE — Patient Instructions (Signed)
HEALTH MAINTENANCE RECOMMENDATIONS:  It is recommended that you get at least 30 minutes of aerobic exercise at least 5 days/week (for weight loss, you may need as much as 60-90 minutes). This can be any activity that gets your heart rate up. This can be divided in 10-15 minute intervals if needed, but try and build up your endurance at least once a week.  Weight bearing exercise is also recommended twice weekly.  Eat a healthy diet with lots of vegetables, fruits and fiber.  "Colorful" foods have a lot of vitamins (ie green vegetables, tomatoes, red peppers, etc).  Limit sweet tea, regular sodas and alcoholic beverages, all of which has a lot of calories and sugar.  Up to 1 alcoholic drink daily may be beneficial for women (unless trying to lose weight, watch sugars).  Drink a lot of water.  Calcium recommendations are 1200-1500 mg daily (1500 mg for postmenopausal women or women without ovaries), and vitamin D 1000 IU daily.  This should be obtained from diet and/or supplements (vitamins), and calcium should not be taken all at once, but in divided doses.  Monthly self breast exams and yearly mammograms for women over the age of 82 is recommended.  Sunscreen of at least SPF 30 should be used on all sun-exposed parts of the skin when outside between the hours of 10 am and 4 pm (not just when at beach or pool, but even with exercise, golf, tennis, and yard work!)  Use a sunscreen that says "broad spectrum" so it covers both UVA and UVB rays, and make sure to reapply every 1-2 hours.  Remember to change the batteries in your smoke detectors when changing your clock times in the spring and fall.  Use your seat belt every time you are in a car, and please drive safely and not be distracted with cell phones and texting while driving.  Schedule your bone density and mammogram (prescription given to you--[facility] is not in our same computer system, so you need to provide them with the prescription).  Return  for high dose flu shot sometime in the next 1-2 months (it isn't available yet, likely we will get in the next 2-3 weeks).

## 2013-02-08 NOTE — Progress Notes (Signed)
Chief Complaint  Patient presents with  . Med check plus    nonfasting med check plus, already had labs done. Wants to see a doctor for her hands(no elevators).   Patient presents for med check, along with Annual Wellness Visit, with complaint of inability to straighten fingers on left hand.  Hypertension follow-up:  Blood pressures are not checked elsewhere.  Denies headaches, chest pain, palpitations.  Sometimes feels a tightness/pressure in her chest like a tight bra (which she did mention to the cardiologist). Denies side effects of medications.  She had the amlodipine stopped by the cardiologist after her spells of dizziness/lightheadedness, feeling like she would pass out.  She has been doing much better over the last 2 weeks, since the medication was stopped.  BP was 150/80 at the f/u visit with the cardiologist when she had the echocardiogram.  She was diagnosed with postural hypotension, and in addition to stopping the amlodipine, was told to wear support hose (she didn't today, but otherwise has been). She has not yet gotten results of echocardiogram, and results have not been received here either (just initial eval by cardiologist). She denies any ankle/foot/leg swelling.  She complains that her legs "just don't want to go", so she hasn't been able to walk like she used to.  She does have some knee pain, but it is more due to what sounds like fatigue "I just can't go".    Hyperlipidemia follow-up:  Patient is reportedly following a low-fat, low cholesterol diet.  Compliant with medications and denies medication side effects.  She states that her diet has been better, trying to eat healthier.  Atrophic vaginitis:  No longer having the genital itching or discomfort.  Using the premarin cream only as needed, generally only 1-2x/month with good relief of symptoms.  Denies vaginal discharge or bleeding.  She is complaining about loss of function in her hands.  She cannot straighten the fingers  of her left hand. It started with her right 5th finger about 6-12 months ago, but she ignored it.  Started in her left fingers about 1-2 months ago. Doesn't improve as the day goes on.  Not painful, just won't straighten and can't use. She doesn't recall having this problem when she was here last (beginning of July).  She has some neck and shoulder pain when she is cooking or vacuuming.  Never has pain in hands. Denies any injury.  Health Maintenance: Immunization History  Administered Date(s) Administered  . Pneumococcal Polysaccharide 06/15/2000, 12/04/2010  . Tdap 09/30/2006  . Zoster 01/27/2011   Last Pap smear: at least 4-5 years ago, can't recall Last mammogram: 01/2010 Last colonoscopy: 2009, Dr. Loreta Ave Last DEXA: 12/19/10 at [facility]. T-2.9 in spine, with statistically significant loss in R hip compared to 11/2008 Ophtho: every 6 weeks for macular degeneration Dentist: twice yearly Exercise:  Hasn't been able to walk lately, so no exercise  Other doctors in her care: Dr. Royanne Foots Dr. Vyas--cardiology Dr. Jacinto Reap Dr. Lavonia Drafts (not since 2009)  End of Life issues:  She has a living will and healthcare power of attorney.  She is Full Code. Depression screen:  See scanned sheet.  Admits that yesterday was a "terrible day" and upset about her hands, and her loss of vision.  She reported "some" difficulty with decisions/concentrating, feeling hopeless and blaming herself.  No SI. ADL screen--see scanned form.  Needs assistance with shopping, transportation.  Problems with vision and hearing (only in noisy environments).  Larey Seat once on New Year's [place] [facility], none  since.  Past Medical History  Diagnosis Date  . Hypertension   . Osteoporosis   . Migraine (atenolol for prophylaxis)  . Macular degeneration   . Kidney stones, calcium oxalate 1993  . Pulmonary hypertension mild,mild AR,mild-mod MR,mild-mod TR  . Hematuria, microscopic DrKimbrough 1993  . Hiatal hernia   . Diverticulosis  (L sided) 1994  . Hyperlipidemia   . BCC (basal cell carcinoma of skin) L cheek-DrLupton (6/09)  . Postmenopausal bleeding 2010-DrCole  . Allergy     s/p immunotherapy  . Mitral regurgitation     mild-mod; mild aortic regurgitation, and mild-mod TR  . Atrophic vaginitis   . Hearing loss     right ear    Past Surgical History  Procedure Laterality Date  . Hemorroidectomy  1953  . Tonsillectomy and adenoidectomy  age 55    History   Social History  . Marital Status: Single    Spouse Name: N/A    Number of Children: 1  . Years of Education: N/A   Occupational History  . retired Art therapist work)    Social History Main Topics  . Smoking status: Former Smoker    Quit date: 06/15/1978  . Smokeless tobacco: Never Used  . Alcohol Use: No  . Drug Use: No  . Sexual Activity: Not on file   Other Topics Concern  . Not on file   Social History Narrative   Lives alone, in [facility].  Son and his family live in [place].     Family History  Problem Relation Age of Onset  . Cirrhosis Mother   . Cancer Father     ? they took out rotten intestines  . Cancer Brother     prostate  . Stroke Brother   . Stroke Brother 49  . Stroke Sister 69  . Breast cancer Sister 56    breast cancer  . Cancer Sister 47    breast cancer  . Heart disease Neg Hx   . Diabetes Neg Hx     Current outpatient prescriptions:aspirin 81 MG tablet, Take 81 mg by mouth daily.  , Disp: , Rfl: ;  atenolol (TENORMIN) 50 MG tablet, Take 1 tablet (50 mg total) by mouth daily., Disp: 30 tablet, Rfl: 11;  Calcium Carbonate-Vitamin D (CALCIUM + D) 600-200 MG-UNIT TABS, Take 1-2 tablets by mouth daily.  , Disp: , Rfl: ;  conjugated estrogens (PREMARIN) vaginal cream, Apply 1/2 gram vaginally twice weekly, Disp: 45 g, Rfl: 1 Multiple Vitamins-Minerals (MULTIVITAMIN WITH MINERALS) tablet, Take 1 tablet by mouth daily.  , Disp: , Rfl: ;  Multiple Vitamins-Minerals (OCUVITE PO), Take 2 tablets by mouth daily.  ,  Disp: , Rfl: ;  Omega-3 Fatty Acids (FISH OIL) 1000 MG CAPS, Take 1-2 capsules by mouth daily.  , Disp: , Rfl: ;  simvastatin (ZOCOR) 20 MG tablet, TAKE ONE HALF TABLET BY MOUTH AT BEDTIME, Disp: 15 tablet, Rfl: 11  Allergies  Allergen Reactions  . Codeine Other (See Comments)    unknown  . Macrobid [Nitrofurantoin Monohydrate Macrocrystals] Nausea Only    Nausea and headache  . Sulfa Antibiotics Other (See Comments)    dizziness   ROS:  Denies headaches or migraines, no fevers, chills.  No syncope, no further dizziness, no falls.  Decreased hearing in R ear; decreased vision.  Chronic watery runny nose. Denies sore throat, cough, shortness of breath.  Chest tightness like a tight bra (see above); no exertional chest pain.  No swelling.  Sometimes has some stinging  and burning in her feet at night, occasionally.  Denies nausea, vomiting, heartburn, abdominal pain, bowel changes (some constipation, controlled by metamucil and stool softener). Occasional mild depression (related to her loss of sight, use of left hand) No vaginal discharge or bleeding; No urinary complaints Both knees sometimes hurt with walking, no other joint pains  PHYSICAL EXAM: BP 140/60  Pulse 68  Ht 5\' 2"  (1.575 m)  Wt 110 lb (49.896 kg)  BMI 20.11 kg/m2  General Appearance:  Alert, cooperative, no distress, appears stated age   Head:  Normocephalic, without obvious abnormality, atraumatic   Eyes:  PERRL, EOMI. Conjunctiva clear. Fundi without hemorrhage/exudate   Ears:  Normal TM's and external ear canals   Nose:  Nares normal, mucosa normal, no drainage or sinus tenderness   Throat:  Lips, mucosa, and tongue normal; dentures (upper and lower)  Neck:  Supple, no lymphadenopathy; thyroid: no enlargement/tenderness/nodules; no carotid  bruit or JVD   Back:  Spine nontender, no curvature, ROM normal, no CVA tenderness   Lungs:  Clear to auscultation bilaterally without wheezes, rales or ronchi; respirations  unlabored   Chest Wall:  No tenderness or deformity   Heart:  Regular rate and rhythm, S1 and S2 normal, no murmur, rub  or gallop. Occasional ectopy   Breast Exam:  No tenderness, masses, or nipple discharge or inversion. No axillary lymphadenopathy   Abdomen:  Soft, non-tender, nondistended, normoactive bowel sounds,  no masses, no hepatosplenomegaly   Genitalia:  Normal external genitalia without lesions. Mild atrophic changes.  BUS normal. Bimanual exam revealed no tenderness or masses. Uterus normal size, adnexa not palpable. Pap not performed due to age   Rectal:  Normal tone, no masses or tenderness; guaiac negative stool   Extremities:  No clubbing, cyanosis or edema. Contractures of fingers of L hand at PIP joints, fingers 2-5, and 5th finger on the right hand.  No nodules in palm.  No atrophy  Pulses:  2+ and symmetric all extremities. + mild varicose veins bilaterally.   Skin:  Skin color, texture, turgor normal, no rashes or lesions   Lymph nodes:  Cervical, supraclavicular, and axillary nodes normal   Neurologic:  CNII-XII intact, normal strength, sensation and gait; reflexes 2+ and symmetric throughout          Psych: Normal mood, affect, hygiene and grooming.   Lab Results  Component Value Date   CHOL 140 02/06/2013   HDL 50 02/06/2013   LDLCALC 70 02/06/2013   TRIG 101 02/06/2013   CHOLHDL 2.8 02/06/2013   Glucose 92  Lab Results  Component Value Date   WBC 5.7 12/19/2012   HGB 12.0 12/19/2012   HCT 35.6* 12/19/2012   MCV 91.5 12/19/2012   PLT 192 12/19/2012   Lab Results  Component Value Date   TSH 1.740 12/19/2012     Chemistry      Component Value Date/Time   NA 142 12/19/2012 1636   K 4.2 12/19/2012 1636   CL 104 12/19/2012 1636   CO2 28 12/19/2012 1636   BUN 10 12/19/2012 1636   CREATININE 0.73 12/19/2012 1636      Component Value Date/Time   CALCIUM 9.0 12/19/2012 1636   ALKPHOS 45 12/19/2012 1636   AST 16 12/19/2012 1636   ALT 8 12/19/2012 1636   BILITOT 0.3 12/19/2012 1636      Vitamin D-OH 35  ASSESSMENT/PLAN:  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  May need referral to hand doctor, but will start with therapy.  Discussed monthly self breast exams and yearly mammograms; at least 30 minutes of aerobic activity at least 5 days/week; proper sunscreen use reviewed; healthy diet, including goals of calcium and vitamin D intake and alcohol recommendations (less than or equal to 1 drink/day) reviewed; regular seatbelt use; changing batteries in smoke detectors.  Immunization recommendations discussed--UTD.  Recommend yearly flu shots, but recommend high dose, which isn't available yet.  Will return in 2-3 weeks.  Colonoscopy recommendations reviewed, UTD.  Discussed reasons to schedule mammo--given her otherwise good life expectancy, and increased risk of developing cancer with age, and her otherwise decent health, I encouraged her to continue with yearly mammograms. Schedule bone density.  She prefers to change to [facility], as it is on the second floor (won't take elevators) rather than going back to [facility] (4th floor).  rx written for DEXA and given to pt.  Reviewed her Code status--full code.  Has living will and medical DPA. Reviewed wellness recommendations--see copy of form given to pt (scanned)  Length of visit 60 minutes, more than 1/2 spent counseling pt.

## 2013-02-15 ENCOUNTER — Encounter: Payer: Self-pay | Admitting: Family Medicine

## 2013-02-17 ENCOUNTER — Encounter: Payer: Self-pay | Admitting: Family Medicine

## 2013-02-18 ENCOUNTER — Other Ambulatory Visit: Payer: Self-pay | Admitting: Family Medicine

## 2013-02-20 ENCOUNTER — Other Ambulatory Visit: Payer: Self-pay | Admitting: *Deleted

## 2013-02-21 ENCOUNTER — Ambulatory Visit: Payer: Medicare Other | Attending: Family Medicine | Admitting: Occupational Therapy

## 2013-02-21 DIAGNOSIS — IMO0001 Reserved for inherently not codable concepts without codable children: Secondary | ICD-10-CM | POA: Insufficient documentation

## 2013-02-21 DIAGNOSIS — R279 Unspecified lack of coordination: Secondary | ICD-10-CM | POA: Insufficient documentation

## 2013-02-21 DIAGNOSIS — M6281 Muscle weakness (generalized): Secondary | ICD-10-CM | POA: Insufficient documentation

## 2013-02-21 DIAGNOSIS — M24549 Contracture, unspecified hand: Secondary | ICD-10-CM | POA: Insufficient documentation

## 2013-02-21 DIAGNOSIS — M2560 Stiffness of unspecified joint, not elsewhere classified: Secondary | ICD-10-CM | POA: Insufficient documentation

## 2013-02-22 ENCOUNTER — Telehealth: Payer: Self-pay | Admitting: Family Medicine

## 2013-02-22 ENCOUNTER — Other Ambulatory Visit: Payer: Self-pay | Admitting: *Deleted

## 2013-02-22 DIAGNOSIS — M2450 Contracture, unspecified joint: Secondary | ICD-10-CM

## 2013-02-22 DIAGNOSIS — M62542 Muscle wasting and atrophy, not elsewhere classified, left hand: Secondary | ICD-10-CM

## 2013-02-22 DIAGNOSIS — R29898 Other symptoms and signs involving the musculoskeletal system: Secondary | ICD-10-CM

## 2013-02-22 NOTE — Telephone Encounter (Signed)
We had discussed poss rheum consult at her visit, but had opted to start with OT first.  If they would like referral, okay for referral (either rheum or neuro is fine with me--probably neuro in case they need to do EMG studies is a better idea). It is not ASAP/urgent

## 2013-02-22 NOTE — Telephone Encounter (Signed)
??   I referred for OT/PT for hand contractures.  Apparently location was changed from [facility] (where I originally referred, for first floor, due to request by pt for closer).  Clearly she was seen--please get info re: why need for ASAP neuro referral.  If anything, I would have though hand or rheum referral for this problem, unless something else is going on (??)

## 2013-02-22 NOTE — Telephone Encounter (Signed)
Spoke with OT, Girtha Hake that did examine her yesterday. She was concerned with the atrophy in Ashley Vaughan's hands. She has extensive muscle wasting and zero grip. PIP joints have contracted very quickly. She wondered if there could be some neuro-muscular component to all this and simply stated to patient and daughter in law that they might want to check with her PCP and see if a neuro consult might be appropriate or maybe a rheum consult. Please advise. Thanks.

## 2013-02-22 NOTE — Telephone Encounter (Signed)
Spoke with patient's daughter in law, Cordelia Pen and let her know that a referral was sent to neuro and they would be in touch to schedule.

## 2013-02-23 ENCOUNTER — Encounter: Payer: Self-pay | Admitting: Neurology

## 2013-02-23 ENCOUNTER — Ambulatory Visit: Payer: Medicare Other | Admitting: Occupational Therapy

## 2013-02-23 ENCOUNTER — Ambulatory Visit (INDEPENDENT_AMBULATORY_CARE_PROVIDER_SITE_OTHER): Payer: Medicare Other | Admitting: Neurology

## 2013-02-23 VITALS — BP 126/51 | HR 42 | Ht 62.0 in | Wt 106.0 lb

## 2013-02-23 DIAGNOSIS — M24542 Contracture, left hand: Secondary | ICD-10-CM

## 2013-02-23 DIAGNOSIS — M24549 Contracture, unspecified hand: Secondary | ICD-10-CM

## 2013-02-23 DIAGNOSIS — I10 Essential (primary) hypertension: Secondary | ICD-10-CM

## 2013-02-23 DIAGNOSIS — R5381 Other malaise: Secondary | ICD-10-CM

## 2013-02-23 DIAGNOSIS — R531 Weakness: Secondary | ICD-10-CM | POA: Insufficient documentation

## 2013-02-23 NOTE — Progress Notes (Signed)
[facility]  PATIENT: Ashley Vaughan DOB: May 11, 1929  HISTORICAL  Moraima is a 79 years old right-handed Caucasian female, accompanied by her daughter-in-law Cordelia Pen, referred by her primary care physician Dr. Lynelle Doctor for evaluation of rapid progression painless muscle atrophy and weakness  She had past medical history of hyperlipidemia, hypertension, still lives alone, independent in daily activities,.  About a year ago, around October 2013, she noticed difficulty with her right hand, drop things from her right hand, difficulty lifting heavy object, progressive worsening, by July 2014, she also developed left hand weakness, muscle atrophy, she has to use both hands to hold a glass of milk.  Since July 2014, she also noticed mild gait difficulty, bilateral lower extremities gave out underneath her, right leg more than left leg, she denies sensory change, she denies bowel and bladder incontinence, she denies visual change, no dysarthria, no dysphagia, she has no significant dyspnea on exertion.  She was referred to physical therapy initially for evaluation of her left hand muscle weakness, to fit in a wrist split, was found to have significant bilateral hand muscle atrophy, is referred to neurology for further evaluation  She has right hearing loss, poor vision due to macular degeneration   Dr. Lynelle Doctor send her to rehab for left hand muscle weakness, [place] in 02/21/2013,    REVIEW OF SYSTEMS: Full 14 system review of systems performed and notable only for fatigue, hearing loss, blurry vision, double vision, loss of vision, easy bleeding, weakness, dizziness, runny nose, depression, anxiety, decreased energy  ALLERGIES: Allergies  Allergen Reactions  . Codeine Other (See Comments)    unknown  . Macrobid [Nitrofurantoin Monohydrate Macrocrystals] Nausea Only    Nausea and headache  . Sulfa Antibiotics Other (See Comments)    dizziness    HOME  MEDICATIONS: Outpatient Prescriptions Prior to Visit  Medication Sig Dispense Refill  . aspirin 81 MG tablet Take 81 mg by mouth daily.        Marland Kitchen atenolol (TENORMIN) 50 MG tablet Take 1 tablet (50 mg total) by mouth daily.  30 tablet  11  . Calcium Carbonate-Vitamin D (CALCIUM + D) 600-200 MG-UNIT TABS Take 1-2 tablets by mouth daily.        Marland Kitchen conjugated estrogens (PREMARIN) vaginal cream Apply 1/2 gram vaginally twice weekly  45 g  1  . Multiple Vitamins-Minerals (MULTIVITAMIN WITH MINERALS) tablet Take 1 tablet by mouth daily.        . Multiple Vitamins-Minerals (OCUVITE PO) Take 2 tablets by mouth daily.        . Omega-3 Fatty Acids (FISH OIL) 1000 MG CAPS Take 1-2 capsules by mouth daily.        . simvastatin (ZOCOR) 20 MG tablet TAKE ONE HALF TABLET BY MOUTH AT BEDTIME  15 tablet  11     PAST MEDICAL HISTORY: Past Medical History  Diagnosis Date  . Hypertension   . Osteoporosis   . Migraine (atenolol for prophylaxis)  . Macular degeneration   . Kidney stones, calcium oxalate 1993  . Pulmonary hypertension mild,mild AR,mild-mod MR,mild-mod TR  . Hematuria, microscopic DrKimbrough 1993  . Hiatal hernia   . Diverticulosis (L sided) 1994  . Hyperlipidemia   . BCC (basal cell carcinoma of skin) L cheek-DrLupton (6/09)  . Postmenopausal bleeding 2010-DrCole  . Allergy   . Mitral regurgitation     mild-mod; mild aortic regurgitation, and mild-mod TR  . Atrophic vaginitis   . Hearing loss     right ear  PAST SURGICAL HISTORY: Past Surgical History  Procedure Laterality Date  . Hemorroidectomy  1953  . Tonsillectomy and adenoidectomy  age 59    FAMILY HISTORY: Family History  Problem Relation Age of Onset  . Cirrhosis Mother   . Cancer Father     ? they took out rotten intestines  . Cancer Brother     prostate  . Stroke Brother   . Stroke Brother 75  . Stroke Sister 8  . Breast cancer Sister 28    breast cancer  . Cancer Sister 30    breast cancer    SOCIAL  HISTORY:  History   Social History  . Marital Status: Single    Spouse Name: N/A    Number of Children: 1  . Years of Education: 12   Occupational History  . retired Art therapist work)    Social History Main Topics  . Smoking status: Former Smoker    Quit date: 06/15/1978  . Smokeless tobacco: Never Used  . Alcohol Use: No  . Drug Use: No  . Sexual Activity: Not on file    Social History Narrative   Lives alone, in [facility].  Son and his family live in [place].    Patient has high school education.   Caffeine- half cup in the morning.   Right handed.    PHYSICAL EXAM    Filed Vitals:   02/23/13 1509  BP: 126/51  Pulse: 42  Height: 5\' 2"  (1.575 m)  Weight: 106 lb (48.081 kg)   Body mass index is 19.38 kg/(m^2).   Generalized: In no acute distress  Neck: Supple, no carotid bruits   Cardiac: Regular rate rhythm  Pulmonary: Clear to auscultation bilaterally  Musculoskeletal: No deformity  Neurological examination  Mentation: Alert oriented to time, place, history taking, and causual conversation  Cranial nerve II-XII: Pupils were equal round reactive to light extraocular movements were full, visual field were full on confrontational test. facial sensation and strength were normal. hearing was intact to finger rubbing bilaterally. Uvula tongue midline.  head turning and shoulder shrug and were normal and symmetric.Tongue protrusion into cheek strength was normal. There was no tongue atrophy, or slow spastic movement.  Motor: she has prominent bilateral intrinsic muscle atrophy. Motor strength (R/L), shoulder abduction 4/4 external rotation4-/4, elbow flexion, 4/4, elbow extension 4-/4 wrist extension 4-/4-, wrist flexion 3/4, grip 3/3, hip flexion 4/4+, knee flexion 4/5 knee extension 5/5 ankle dorsiflexion 4/4+,     Sensory: Intact to fine touch, pinprick, preserved vibratory sensation, and proprioception at toes.  Coordination: Normal finger to nose,  heel-to-shin bilaterally there was no truncal ataxia  Gait: Rising up from seated position without assistance, normal stance, without trunk ataxia, moderate stride, good arm swing, smooth turning, able to perform tiptoe, and heel walking without difficulty.   Romberg signs: Negative  Deep tendon reflexes: Brachioradialis 3/3, biceps 3/3, triceps 3/3, patellar 3/3, Achilles 2/2, plantar responses were  extensor  bilaterally.   DIAGNOSTIC DATA (LABS, IMAGING, TESTING) - I reviewed patient records, labs, notes, testing and imaging myself where available.  Lab Results  Component Value Date   WBC 5.7 12/19/2012   HGB 12.0 12/19/2012   HCT 35.6* 12/19/2012   MCV 91.5 12/19/2012   PLT 192 12/19/2012      Component Value Date/Time   NA 142 12/19/2012 1636   K 4.2 12/19/2012 1636   CL 104 12/19/2012 1636   CO2 28 12/19/2012 1636   GLUCOSE 104* 12/19/2012 1636   BUN 10  12/19/2012 1636   CREATININE 0.73 12/19/2012 1636   CALCIUM 9.0 12/19/2012 1636   PROT 6.5 12/19/2012 1636   ALBUMIN 4.1 12/19/2012 1636   AST 16 12/19/2012 1636   ALT 8 12/19/2012 1636   ALKPHOS 45 12/19/2012 1636   BILITOT 0.3 12/19/2012 1636   Lab Results  Component Value Date   CHOL 140 02/06/2013   HDL 50 02/06/2013   LDLCALC 70 02/06/2013   TRIG 101 02/06/2013   CHOLHDL 2.8 02/06/2013     Lab Results  Component Value Date   TSH 1.740 12/19/2012    ASSESSMENT AND PLAN   79 years old right-handed Caucasian female, with rapid progression pain his muscle weakness, atrophy, hyperreflexia, this  differentiation diagnosis including motor neuron disease, need to rule out paraneoplastic syndrome, bilateral cervical, lumbosacral radiculopathies, nutritional deficiency, inflammatory process,.  1 proceed with MRI of brain, cervical, thoracic, lumbar spine,. 2. Laboratory evaluation 3. EMG nerve conduction study .   Levert Feinstein, M.D. Ph.D.  [facility] [address] [place], [place] [address] [phone]

## 2013-02-24 LAB — ANA W/REFLEX IF POSITIVE: Anti Nuclear Antibody(ANA): NEGATIVE

## 2013-02-24 LAB — C-REACTIVE PROTEIN: CRP: 0.5 mg/L (ref 0.0–4.9)

## 2013-02-24 LAB — CK: Total CK: 232 U/L — ABNORMAL HIGH (ref 24–173)

## 2013-02-24 LAB — FOLATE: Folate: >19.9 ng/mL (ref >3.0)

## 2013-02-28 ENCOUNTER — Ambulatory Visit: Payer: Medicare Other | Admitting: Occupational Therapy

## 2013-02-28 ENCOUNTER — Other Ambulatory Visit (INDEPENDENT_AMBULATORY_CARE_PROVIDER_SITE_OTHER): Payer: Medicare Other

## 2013-02-28 DIAGNOSIS — Z1211 Encounter for screening for malignant neoplasm of colon: Secondary | ICD-10-CM

## 2013-02-28 LAB — HEMOCCULT GUIAC POC 1CARD (OFFICE)
Card #3 Fecal Occult Blood, POC: POSITIVE
Fecal Occult Blood, POC: NEGATIVE

## 2013-03-01 ENCOUNTER — Telehealth: Payer: Self-pay | Admitting: *Deleted

## 2013-03-01 ENCOUNTER — Other Ambulatory Visit: Payer: Self-pay | Admitting: *Deleted

## 2013-03-01 DIAGNOSIS — D649 Anemia, unspecified: Secondary | ICD-10-CM

## 2013-03-01 NOTE — Telephone Encounter (Signed)
I spoke with patient and she does recall straining when having a bowel movement. No hemorrhoids.

## 2013-03-01 NOTE — Telephone Encounter (Signed)
Message copied by Melonie Florida on Wed Mar 01, 2013 12:21 PM ------      Message from: KNAPP, EVE      Created: Tue Feb 28, 2013  2:52 PM       Advise pt 1/3 stool cards were positive for blood.  Ask if any hemorrhoids or straining.  If no symptoms, recommend repeating cards in 3 months (when asymptomatic) along with repeating CBC and ferritin to look for anemia (she had borderline hg on last check) ------

## 2013-03-01 NOTE — Telephone Encounter (Signed)
Spoke with patient and mailed her stool cards, she will do these in December and bring with her to lab appt that is scheduled for 06/01/13.

## 2013-03-01 NOTE — Telephone Encounter (Signed)
Then plan on rechecking in 3 months (cards and CBC and ferritin).  To collect samples when normal, soft BM (no straining or bleeding noted)

## 2013-03-02 ENCOUNTER — Ambulatory Visit: Payer: Medicare Other | Admitting: Occupational Therapy

## 2013-03-06 ENCOUNTER — Telehealth: Payer: Self-pay | Admitting: Neurology

## 2013-03-06 NOTE — Telephone Encounter (Signed)
Call pt back about getting some medication to calm her down to have the MRI done. Spoke with Dr. Terrace Arabia and Dr. Terrace Arabia Wanted the pt to take Xanax. Pt verbalized understanding and she said she would come by the office to pick some up.

## 2013-03-06 NOTE — Telephone Encounter (Signed)
Dispensed Alprazolam 0.5mg  tabs #3 Lot [id number] Exp 03/2014

## 2013-03-07 ENCOUNTER — Ambulatory Visit
Admission: RE | Admit: 2013-03-07 | Discharge: 2013-03-07 | Disposition: A | Discharge status: Home / Self Care | Payer: Medicare Other | Source: Ambulatory Visit | Attending: Neurology | Admitting: Neurology

## 2013-03-07 ENCOUNTER — Ambulatory Visit: Payer: Medicare Other | Admitting: Occupational Therapy

## 2013-03-07 DIAGNOSIS — R531 Weakness: Secondary | ICD-10-CM

## 2013-03-07 DIAGNOSIS — I10 Essential (primary) hypertension: Secondary | ICD-10-CM

## 2013-03-07 DIAGNOSIS — R5381 Other malaise: Secondary | ICD-10-CM

## 2013-03-07 DIAGNOSIS — M24542 Contracture, left hand: Secondary | ICD-10-CM

## 2013-03-07 DIAGNOSIS — R5383 Other fatigue: Secondary | ICD-10-CM

## 2013-03-09 ENCOUNTER — Ambulatory Visit: Payer: Medicare Other | Admitting: Occupational Therapy

## 2013-03-14 ENCOUNTER — Ambulatory Visit: Payer: Medicare Other | Admitting: Occupational Therapy

## 2013-03-16 ENCOUNTER — Ambulatory Visit (INDEPENDENT_AMBULATORY_CARE_PROVIDER_SITE_OTHER): Payer: Medicare Other | Admitting: Neurology

## 2013-03-16 ENCOUNTER — Telehealth: Payer: Self-pay | Admitting: Neurology

## 2013-03-16 ENCOUNTER — Encounter: Payer: Medicare Other | Admitting: Occupational Therapy

## 2013-03-16 ENCOUNTER — Ambulatory Visit (INDEPENDENT_AMBULATORY_CARE_PROVIDER_SITE_OTHER): Payer: Medicare Other

## 2013-03-16 DIAGNOSIS — G1221 Amyotrophic lateral sclerosis: Secondary | ICD-10-CM

## 2013-03-16 DIAGNOSIS — M24549 Contracture, unspecified hand: Secondary | ICD-10-CM

## 2013-03-16 DIAGNOSIS — I10 Essential (primary) hypertension: Secondary | ICD-10-CM

## 2013-03-16 DIAGNOSIS — R531 Weakness: Secondary | ICD-10-CM

## 2013-03-16 DIAGNOSIS — G1220 Motor neuron disease, unspecified: Secondary | ICD-10-CM | POA: Insufficient documentation

## 2013-03-16 DIAGNOSIS — M24542 Contracture, left hand: Secondary | ICD-10-CM

## 2013-03-16 DIAGNOSIS — R5381 Other malaise: Secondary | ICD-10-CM

## 2013-03-16 NOTE — Procedures (Signed)
    [facility]  NCS (NERVE CONDUCTION STUDY) WITH EMG (ELECTROMYOGRAPHY) REPORT   STUDY DATE: Oct 2nd 2014 PATIENT NAME: Ashley Vaughan DOB: 02-12-1929 MRN: [id number]    TECHNOLOGIST: Gearldine Shown ELECTROMYOGRAPHER: Levert Feinstein M.D.  CLINICAL INFORMATION:    [name] is a 79 years old right-handed Caucasian female, presenting with one year history of painless muscle atrophy and weakness, involving both arms, lower extremity now.  She denies sensory changes.  On examination: she has prominent bilateral intrinsic muscle atrophy. Motor strength (R/L), shoulder abduction 4/4, external rotation4-/4, elbow flexion, 4/4, elbow extension 4-/4 wrist extension 4-/4-, wrist flexion 3/4, grip 3/3, hip flexion 4/4+, knee flexion 4/5 knee extension 5/5 ankle dorsiflexion 4/4+, hyperreflexia of both upper and lower extremity muscles, bilateral Babinski signs,    FINDINGS: NERVE CONDUCTION STUDY: Bilateral peroneal sensory responses were absent. Right peroneal to EDB motor response showed moderately decreased C. map amplitude, left peroneal to EDB motor responses was essentially normal.left tibial motor responses were normal. Right tibial motor response showed mild to moderate the decreased C. map amplitude, with normal conduction velocity,  Bilateral tibial H. reflexes were absent .  Bilateral ulnar sensory responses were normal.  Bilateral median sensory responses showed mildly prolonged peak latency. Bilateral median motor responses, left ulnar motor response showed severely decreased C. map amplitude, was normal conduction velocity. Right ulnar motor responses showed normal distal latency, mild to moderately decreased C. map amplitude, with normal conduction velocity, F wave latency was absent      NEEDLE ELECTROMYOGRAPHY:  selected needle examination was performed at right upper extremity muscles, right lower extremity muscles, right lumbosacral paraspinals, right thoracic  paraspinals, right sternocleidomastoid, right genioglossus muscles,   Right biceps, deltoid, triceps, increasing insertion activity, occasionally muscle fasciculations, 1 plus spontaneous activity, complex enlarged motor unit potential, with decreased recruitment patterns  Right tibialis anterior, medial gastrocnemius, vastus lateralis: increased insertion activity, occasionally muscle fasciculations, no spontaneous activity, normal morphology motor unit potential with mildly decreased recruitment patterns.  There was no spontaneous activity at the right lumbosacral paraspinal muscles,   at right L4, L5,  S1.   There was no spontaneous activity at right thoracic paraspinal muscles right T10, T11.  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 Differentiation diagnosis also including paraneoplastic syndrome, nutritional deficiency, inflammatory process. There is no evidence of conduction block .      INTERPRETING PHYSICIAN:   Levert Feinstein M.D. Ph.D. [facility] [address], [address] [place], [place] [address] [phone]

## 2013-03-16 NOTE — Progress Notes (Signed)
[facility]  PATIENT: Ashley Vaughan DOB: [birth year]  HISTORICAL  Aribella is a 79 years old right-handed Caucasian female, accompanied by her daughter-in-law Cordelia Pen, referred by her primary care physician Dr. Lynelle Doctor for evaluation of rapid progression painless muscle atrophy and weakness  She had past medical history of hyperlipidemia, hypertension, still lives alone, independent in daily activities,.  About a year ago, around October 2013, she noticed difficulty with her right hand, drop things from her right hand, difficulty lifting heavy object, progressive worsening, by July 2014, she also developed left hand weakness, muscle atrophy, she has to use both hands to hold a glass of milk.  Since July 2014, she also noticed mild gait difficulty, bilateral lower extremities gave out underneath her, right leg more than left leg, she denies sensory change, she denies bowel and bladder incontinence, she denies visual change, no dysarthria, no dysphagia, she has no significant dyspnea on exertion.  She was referred to physical therapy initially for evaluation of her left hand muscle weakness, to fit in a wrist split, was found to have significant bilateral hand muscle atrophy, is referred to neurology for further evaluation  She has right hearing loss, poor vision due to macular degeneration   UPDATE Oct 2nd 2014;  She came for electrodiagnostic study today, which demonstrated active denervation involving right cervical, lumbosacral myotomes, subtle chronic neuropathic changes involving right sternocleidomastoid, most consistent with motor neuron disease, we also reviewed MRI of the brain, showed mild small vessel disease, MRI of cervical spine C5-6: facet hypertrophy with mild spinal stenosis, no biforaminal foraminal stenosis; no cord signal changes.  C3-4: uncovertebral joint hypertrophy with mild right and severe left foraminal stenosis. C4-5: facet hypertrophy with mild  biforaminal foraminal stenosis There is no significant canal, or foraminal stenosis at MRI thoracic, or lumbar spine.   Laboratory evaluation showed normal or negative B12, RPR, folate acid, ANA, TSH,    REVIEW OF SYSTEMS: Full 14 system review of systems performed and notable only for fatigue, hearing loss, blurry vision, double vision, loss of vision, easy bleeding, weakness, dizziness, runny nose, depression, anxiety, decreased energy  ALLERGIES: Allergies  Allergen Reactions  . Codeine Other (See Comments)    unknown  . Macrobid [Nitrofurantoin Monohydrate Macrocrystals] Nausea Only    Nausea and headache  . Sulfa Antibiotics Other (See Comments)    dizziness    HOME MEDICATIONS: Outpatient Prescriptions Prior to Visit  Medication Sig Dispense Refill  . aspirin 81 MG tablet Take 81 mg by mouth daily.        Marland Kitchen atenolol (TENORMIN) 50 MG tablet Take 1 tablet (50 mg total) by mouth daily.  30 tablet  11  . Calcium Carbonate-Vitamin D (CALCIUM + D) 600-200 MG-UNIT TABS Take 1-2 tablets by mouth daily.        Marland Kitchen conjugated estrogens (PREMARIN) vaginal cream Apply 1/2 gram vaginally twice weekly  45 g  1  . Multiple Vitamins-Minerals (MULTIVITAMIN WITH MINERALS) tablet Take 1 tablet by mouth daily.        . Multiple Vitamins-Minerals (OCUVITE PO) Take 2 tablets by mouth daily.        . Omega-3 Fatty Acids (FISH OIL) 1000 MG CAPS Take 1-2 capsules by mouth daily.        . simvastatin (ZOCOR) 20 MG tablet TAKE ONE HALF TABLET BY MOUTH AT BEDTIME  15 tablet  11     PAST MEDICAL HISTORY: Past Medical History  Diagnosis Date  . Hypertension   . Osteoporosis   .  Migraine (atenolol for prophylaxis)  . Macular degeneration   . Kidney stones, calcium oxalate 1993  . Pulmonary hypertension mild,mild AR,mild-mod MR,mild-mod TR  . Hematuria, microscopic DrKimbrough 1993  . Hiatal hernia   . Diverticulosis (L sided) 1994  . Hyperlipidemia   . BCC (basal cell carcinoma of skin) L  cheek-DrLupton (6/09)  . Postmenopausal bleeding 2010-DrCole  . Allergy   . Mitral regurgitation     mild-mod; mild aortic regurgitation, and mild-mod TR  . Atrophic vaginitis   . Hearing loss     right ear    PAST SURGICAL HISTORY: Past Surgical History  Procedure Laterality Date  . Hemorroidectomy  1953  . Tonsillectomy and adenoidectomy  age 17    FAMILY HISTORY: Family History  Problem Relation Age of Onset  . Cirrhosis Mother   . Cancer Father     ? they took out rotten intestines  . Cancer Brother     prostate  . Stroke Brother   . Stroke Brother 77  . Stroke Sister 10  . Breast cancer Sister 110    breast cancer  . Cancer Sister 54    breast cancer    SOCIAL HISTORY:  History   Social History  . Marital Status: Single    Spouse Name: N/A    Number of Children: 1  . Years of Education: 12   Occupational History  . retired Art therapist work)    Social History Main Topics  . Smoking status: Former Smoker    Quit date: 06/15/1978  . Smokeless tobacco: Never Used  . Alcohol Use: No  . Drug Use: No  . Sexual Activity: Not on file    Social History Narrative   Lives alone, in [facility].  Son and his family live in [place].    Patient has high school education.   Caffeine- half cup in the morning.   Right handed.    PHYSICAL EXAM    There were no vitals filed for this visit. There is no weight on file to calculate BMI.   Generalized: In no acute distress  Neck: Supple, no carotid bruits   Cardiac: Regular rate rhythm  Pulmonary: Clear to auscultation bilaterally  Musculoskeletal: No deformity  Neurological examination  Mentation: Alert oriented to time, place, history taking, and causual conversation  Cranial nerve II-XII: Pupils were equal round reactive to light extraocular movements were full, visual field were full on confrontational test. facial sensation and strength were normal. hearing was intact to finger rubbing  bilaterally. Uvula tongue midline.  head turning and shoulder shrug and were normal and symmetric.Tongue protrusion into cheek strength was normal. There was no tongue atrophy, or slow spastic movement.  Motor: she has prominent bilateral intrinsic muscle atrophy. Motor strength (R/L), shoulder abduction 4/4 external rotation4-/4, elbow flexion, 4/4, elbow extension 4-/4 wrist extension 4-/4-, wrist flexion 3/4, grip 3/3, hip flexion 4/4+, knee flexion 4/5 knee extension 5/5 ankle dorsiflexion 4/4+,     Sensory: Intact to fine touch, pinprick, preserved vibratory sensation, and proprioception at toes.  Coordination: Normal finger to nose, heel-to-shin bilaterally there was no truncal ataxia  Gait: Rising up from seated position without assistance, normal stance, without trunk ataxia, moderate stride, good arm swing, smooth turning, able to perform tiptoe, and heel walking without difficulty.   Romberg signs: Negative  Deep tendon reflexes: Brachioradialis 3/3, biceps 3/3, triceps 3/3, patellar 3/3, Achilles 2/2, plantar responses were  extensor  bilaterally.   DIAGNOSTIC DATA (LABS, IMAGING, TESTING) - I reviewed patient  records, labs, notes, testing and imaging myself where available.  Lab Results  Component Value Date   WBC 5.7 12/19/2012   HGB 12.0 12/19/2012   HCT 35.6* 12/19/2012   MCV 91.5 12/19/2012   PLT 192 12/19/2012      Component Value Date/Time   NA 142 12/19/2012 1636   K 4.2 12/19/2012 1636   CL 104 12/19/2012 1636   CO2 28 12/19/2012 1636   GLUCOSE 104* 12/19/2012 1636   BUN 10 12/19/2012 1636   CREATININE 0.73 12/19/2012 1636   CALCIUM 9.0 12/19/2012 1636   PROT 6.5 12/19/2012 1636   ALBUMIN 4.1 12/19/2012 1636   AST 16 12/19/2012 1636   ALT 8 12/19/2012 1636   ALKPHOS 45 12/19/2012 1636   BILITOT 0.3 12/19/2012 1636   Lab Results  Component Value Date   CHOL 140 02/06/2013   HDL 50 02/06/2013   LDLCALC 70 02/06/2013   TRIG 101 02/06/2013   CHOLHDL 2.8 02/06/2013     Lab Results   Component Value Date   TSH 1.740 12/19/2012    ASSESSMENT AND PLAN   79 years old right-handed Caucasian female, with rapid progression painless  muscle weakness, atrophy, hyperreflexia, EMG confirmed active neuropathic changes involving right cervical, lumbar myotomes, mild chronic neuropathic changes involving the right sternocleidomastoid muscle, 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 Ph.D.  [facility] [address], [address] [place], [place] [address] [phone]

## 2013-03-16 NOTE — Patient Instructions (Signed)
  clinic coordinator at [phone]

## 2013-03-20 ENCOUNTER — Encounter: Payer: Medicare Other | Admitting: Family Medicine

## 2013-03-20 ENCOUNTER — Telehealth: Payer: Self-pay | Admitting: Family Medicine

## 2013-03-20 NOTE — Telephone Encounter (Signed)
Left message for patient, not sure what Rx she is talking about.  Asked to call back.

## 2013-03-20 NOTE — Telephone Encounter (Signed)
This was sent to me, is this okay for me to do?

## 2013-03-20 NOTE — Telephone Encounter (Signed)
Referral written on rx to be faxed

## 2013-03-20 NOTE — Telephone Encounter (Signed)
Faxed

## 2013-03-20 NOTE — Telephone Encounter (Signed)
°  Dr. Lynelle Doctor sent to [facility] for therapy and they had her see neurologist and the neurologist, Dr. Terrace Arabia, dx with ALS but they want her to go to [facility] for second opinion.  They just found out that her insurance requires her to have referral from Dr. Lynelle Doctor  (Dr. Lynelle Doctor PCP on Medicare Complete)  She has appointment today, 03/20/13 with Dr. Janan Ridge  [phone])  at 2:00  I called Dr. Alphonzo Dublin office and asked what information they needed per Jasmine December they need written referral faxed to [phone]  Sent to  the attention of Yates Decamp   Please also call daughter, she states they have to get insurance to cover because otherwise they cannot afford

## 2013-03-21 ENCOUNTER — Encounter: Payer: Medicare Other | Admitting: Occupational Therapy

## 2013-03-21 ENCOUNTER — Telehealth: Payer: Self-pay | Admitting: Neurology

## 2013-03-21 NOTE — Telephone Encounter (Signed)
I spoke to the patient's daughter in law and she would like the patient to have xanax, pt took it for MRI and said it helped.  Please advise.

## 2013-03-21 NOTE — Telephone Encounter (Signed)
Daughter in law wants patient to have xanax.  Please see phone note from 03-16-13.

## 2013-03-23 ENCOUNTER — Telehealth: Payer: Self-pay | Admitting: Neurology

## 2013-03-23 ENCOUNTER — Encounter: Payer: Medicare Other | Admitting: Occupational Therapy

## 2013-03-23 MED ORDER — SERTRALINE HCL 50 MG PO TABS: 50.0000 mg | ORAL_TABLET | Freq: Every day | ORAL | Status: DC

## 2013-03-23 NOTE — Telephone Encounter (Signed)
Chart reviewed, she has motor neuron disease, she has anxiety, improved by xanax, it might worsening her breathing difficulty, will add on zoloft 50mg  qday, rx called in. Talked with her daughter in law, Roanna Raider.

## 2013-03-24 NOTE — Telephone Encounter (Signed)
Please see phone note 03-23-13.

## 2013-03-24 NOTE — Telephone Encounter (Signed)
Noted  

## 2013-04-19 ENCOUNTER — Telehealth: Payer: Self-pay | Admitting: *Deleted

## 2013-04-19 NOTE — Telephone Encounter (Signed)
Patient called in and was concerned that her pulse was 40 today while RN from home health was there. I spoke with patient and was completely asymptomatic. She denied any SOB, dizziness or lightheadedness. She will continue to monitor and call her cardiologist if this becomes bothersome to her.

## 2013-05-10 ENCOUNTER — Encounter: Payer: Self-pay | Admitting: Family Medicine

## 2013-05-10 DIAGNOSIS — R001 Bradycardia, unspecified: Secondary | ICD-10-CM | POA: Insufficient documentation

## 2013-06-01 ENCOUNTER — Encounter: Payer: Self-pay | Admitting: *Deleted

## 2013-06-01 ENCOUNTER — Telehealth: Payer: Self-pay | Admitting: *Deleted

## 2013-06-01 ENCOUNTER — Other Ambulatory Visit: Payer: Self-pay | Admitting: Family Medicine

## 2013-06-01 ENCOUNTER — Telehealth (INDEPENDENT_AMBULATORY_CARE_PROVIDER_SITE_OTHER): Payer: Medicare Other | Admitting: *Deleted

## 2013-06-01 ENCOUNTER — Other Ambulatory Visit (INDEPENDENT_AMBULATORY_CARE_PROVIDER_SITE_OTHER): Payer: Medicare Other

## 2013-06-01 DIAGNOSIS — D649 Anemia, unspecified: Secondary | ICD-10-CM

## 2013-06-01 DIAGNOSIS — Z23 Encounter for immunization: Secondary | ICD-10-CM

## 2013-06-01 LAB — CBC WITH DIFFERENTIAL/PLATELET
Basophils Absolute: 0 10*3/uL (ref 0.0–0.1)
Basophils Relative: 1 % (ref 0–1)
Eosinophils Absolute: 0.1 10*3/uL (ref 0.0–0.7)
Eosinophils Relative: 2 % (ref 0–5)
HCT: 34.3 % — ABNORMAL LOW (ref 36.0–46.0)
Hemoglobin: 11.5 g/dL — ABNORMAL LOW (ref 12.0–15.0)
Lymphs Abs: 1.7 10*3/uL (ref 0.7–4.0)
MCH: 31.6 pg (ref 26.0–34.0)
MCHC: 33.5 g/dL (ref 30.0–36.0)
Monocytes Absolute: 0.8 10*3/uL (ref 0.1–1.0)
Neutro Abs: 3.3 10*3/uL (ref 1.7–7.7)
RBC: 3.64 MIL/uL — ABNORMAL LOW (ref 3.87–5.11)
RDW: 13.7 % (ref 11.5–15.5)

## 2013-06-01 LAB — FERRITIN: Ferritin: 74 ng/mL (ref 10–291)

## 2013-06-01 LAB — POC HEMOCCULT BLD/STL (HOME/3-CARD/SCREEN): Card #3 Fecal Occult Blood, POC: NEGATIVE

## 2013-06-01 NOTE — Telephone Encounter (Signed)
Advise pt that form was signed, and that stool cards were negative

## 2013-06-01 NOTE — Telephone Encounter (Signed)
Patient advised.

## 2013-06-01 NOTE — Telephone Encounter (Signed)
Patient was in this morning for labs, also dropped off stool cards and got a flu shot. She also gave me an updated medication and allergy list, chart was updated. She also brought in a handicapped placard form and asked if you could fill out for her. I put in on her chart on your shelf. Thanks.

## 2013-06-02 LAB — IRON: Iron: 97 ug/dL (ref 42–145)

## 2013-06-02 LAB — VITAMIN B12: Vitamin B-12: 213 pg/mL (ref 211–911)

## 2013-06-02 NOTE — Progress Notes (Signed)
GAVE TO TT

## 2013-06-12 ENCOUNTER — Encounter: Payer: Self-pay | Admitting: Family Medicine

## 2013-06-12 ENCOUNTER — Ambulatory Visit (INDEPENDENT_AMBULATORY_CARE_PROVIDER_SITE_OTHER): Payer: Medicare Other | Admitting: Family Medicine

## 2013-06-12 VITALS — BP 116/76 | HR 60 | Wt 106.0 lb

## 2013-06-12 DIAGNOSIS — G1221 Amyotrophic lateral sclerosis: Secondary | ICD-10-CM

## 2013-06-12 DIAGNOSIS — E538 Deficiency of other specified B group vitamins: Secondary | ICD-10-CM | POA: Insufficient documentation

## 2013-06-12 MED ORDER — CYANOCOBALAMIN 1000 MCG/ML IJ SOLN
1000.0000 ug | Freq: Once | INTRAMUSCULAR | Status: AC
  Administered 2013-06-12: 1000 ug via INTRAMUSCULAR

## 2013-06-12 NOTE — Progress Notes (Signed)
Chief Complaint  Patient presents with  . b12 low    pt b12 count was low    She presents for follow up on her labs. She was found to have 1/3 hemoccult cards+, and had them repeated along with a CBC.  Repeat hemoccults were negative x 3, but she was noted to have mild anemia.  Iron/ferritin were normal, but B12 was low.  She is complaining of some burning in her feet, intermittently.  Denies numbness, tingling.  Denies any memory concerns.  She was diagnosed with ALS, confirmed by second opinion at [facility]. Legs sometimes feel wobbly, otherwise denies any further decline/weakness. Continues to have significant hand weakness/atrophy.  She is using a walker at home.   She was started on rilutek twice daily.  She had also been on put on Lexaproforanxiety, but stopped it due to nausea,  "deathly sick".  She was then rx'd zoloft, but she states she never picked this up from the pharmacy.  She takes lorazepam 1/2 tablet in the morning, and 1 full tablet at bedtime, to help with anxiety and help her sleep at night (rx'd by neuro at [facility]).  Past Medical History  Diagnosis Date  . Hypertension   . Osteoporosis   . Migraine (atenolol for prophylaxis)  . Macular degeneration   . Kidney stones, calcium oxalate 1993  . Pulmonary hypertension mild,mild AR,mild-mod MR,mild-mod TR  . Hematuria, microscopic DrKimbrough 1993  . Hiatal hernia   . Diverticulosis (L sided) 1994  . Hyperlipidemia   . BCC (basal cell carcinoma of skin) L cheek-DrLupton (6/09)  . Postmenopausal bleeding 2010-DrCole  . Allergy     s/p immunotherapy  . Mitral regurgitation     mild-mod; mild aortic regurgitation, and mild-mod TR  . Atrophic vaginitis   . Hearing loss     right ear  . B12 deficiency   . ALS (amyotrophic lateral sclerosis)    Past Surgical History  Procedure Laterality Date  . Hemorroidectomy  1953  . Tonsillectomy and adenoidectomy  age 55   History   Social History  . Marital Status: Single   Spouse Name: N/A    Number of Children: 1  . Years of Education: 12   Occupational History  . retired Art therapist work)    Social History Main Topics  . Smoking status: Former Smoker    Quit date: 06/15/1978  . Smokeless tobacco: Never Used  . Alcohol Use: No  . Drug Use: No  . Sexual Activity: Not on file   Other Topics Concern  . Not on file   Social History Narrative   Lives alone, in [facility].  Son and his family live in [place].    Patient has high school education.   Caffeine- half cup in the morning.   Right handed.   Current outpatient prescriptions:aspirin 81 MG tablet, Take 81 mg by mouth daily.  , Disp: , Rfl: ;  atenolol (TENORMIN) 50 MG tablet, Take 25 mg by mouth daily., Disp: , Rfl: ;  riluzole (RILUTEK) 50 MG tablet, Take 50 mg by mouth every 12 (twelve) hours., Disp: , Rfl: ;  simvastatin (ZOCOR) 20 MG tablet, TAKE ONE HALF TABLET BY MOUTH AT BEDTIME, Disp: 15 tablet, Rfl: 11 Calcium Carbonate-Vitamin D (CALCIUM + D) 600-200 MG-UNIT TABS, Take 1-2 tablets by mouth daily.  , Disp: , Rfl: ;  conjugated estrogens (PREMARIN) vaginal cream, Apply 1/2 gram vaginally twice weekly, Disp: 45 g, Rfl: 1;  Multiple Vitamins-Minerals (MULTIVITAMIN WITH MINERALS) tablet, Take 1  tablet by mouth daily.  , Disp: , Rfl: ;  Multiple Vitamins-Minerals (OCUVITE PO), Take 2 tablets by mouth daily.  , Disp: , Rfl:  Omega-3 Fatty Acids (FISH OIL) 1000 MG CAPS, Take 1-2 capsules by mouth daily.  , Disp: , Rfl: ;  sertraline (ZOLOFT) 50 MG tablet, Take 1 tablet (50 mg total) by mouth daily., Disp: 30 tablet, Rfl: 12 She hasn't been taking her vitamins very regularly, just sporadically.  Allergies  Allergen Reactions  . Codeine Other (See Comments)    unknown  . Lexapro [Escitalopram]   . Macrobid [Nitrofurantoin Monohydrate Macrocrystals] Nausea Only    Nausea and headache  . Sulfa Antibiotics Other (See Comments)    dizziness   ROS:  Denies fevers, chills, headaches, dizziness,  URI symptoms, cough, shortness of breath, chest pain, palpitations, nausea, vomiting, bowel changes, bleeding, bruising, rashes, depression.  +some anxiety.  Denies other complaints, except as noted in HPI.  PHYSICAL EXAM: BP 116/76  Pulse 60  Wt 106 lb (48.081 kg) Pleasant, elderly female in no distress.  Does not appear anxious.  HEENT:  PERRL, conjunctiva clear. Mild swelling of R face compared to left noted--pt not aware.  Nontender, no mass, just slight asymmetry noted, with fullness and fewer wrinkles on the right side of the face Neck:no lymphadenopathy, thyromegaly or mass Heart: regular rate and rhythm Lungs: clear Skin: no rashes  Lab Results  Component Value Date   WBC 5.9 06/01/2013   HGB 11.5* 06/01/2013   HCT 34.3* 06/01/2013   MCV 94.2 06/01/2013   PLT 199 06/01/2013   Lab Results  Component Value Date   VITAMINB12 213 06/01/2013   Lab Results  Component Value Date   IRON 97 06/01/2013   FERRITIN 74 06/01/2013   ASSESSMENT/PLAn:  Vitamin B12 deficiency - Plan: cyanocobalamin ((VITAMIN B-12)) injection 1,000 mcg  ALS (amyotrophic lateral sclerosis)   B12 injections weekly x 4 weeks (first given today, nurse visits for the next 3). Then start 1000 mcg (1 mg) of over-the-counter Vitamin B12, to be taken every day.  Return in 8 weeks for repeat bloodwork to be done at a follow-up visit

## 2013-06-12 NOTE — Patient Instructions (Addendum)
Your vitamin B12 was low (level was 212), and you were mildly anemic. Lab Results  Component Value Date   WBC 5.9 06/01/2013   HGB 11.5* 06/01/2013   HCT 34.3* 06/01/2013   MCV 94.2 06/01/2013   PLT 199 06/01/2013   B12 injections weekly x 4 weeks (first given today, then nurse visits for the next 3 weeks for the shots). Then start 1000 mcg (1 mg) of over-the-counter Vitamin B12, to be taken every day. This does not require a prescription.  It is either taking this vitamin once daily, or continuing with B12 shots once a month.  Let's try the oral vitamins first to see if they work.  Return in 8 weeks for repeat bloodwork to be done at your follow-up visit (nonfasting)

## 2013-06-19 ENCOUNTER — Other Ambulatory Visit (INDEPENDENT_AMBULATORY_CARE_PROVIDER_SITE_OTHER): Payer: Medicare Other

## 2013-06-19 DIAGNOSIS — E538 Deficiency of other specified B group vitamins: Secondary | ICD-10-CM

## 2013-06-19 MED ORDER — CYANOCOBALAMIN 1000 MCG/ML IJ SOLN
1000.0000 ug | Freq: Once | INTRAMUSCULAR | Status: AC
  Administered 2013-06-19: 1000 ug via INTRAMUSCULAR

## 2013-06-26 ENCOUNTER — Other Ambulatory Visit (INDEPENDENT_AMBULATORY_CARE_PROVIDER_SITE_OTHER): Payer: Medicare Other

## 2013-06-26 DIAGNOSIS — D518 Other vitamin B12 deficiency anemias: Secondary | ICD-10-CM

## 2013-06-26 DIAGNOSIS — D519 Vitamin B12 deficiency anemia, unspecified: Secondary | ICD-10-CM

## 2013-06-26 MED ORDER — CYANOCOBALAMIN 1000 MCG/ML IJ SOLN
1000.0000 ug | Freq: Once | INTRAMUSCULAR | Status: AC
  Administered 2013-06-26: 1000 ug via INTRAMUSCULAR

## 2013-06-26 MED ORDER — CYANOCOBALAMIN 1000 MCG/ML IJ SOLN: 1000.0000 ug | Freq: Once | INTRAMUSCULAR | Status: DC

## 2013-06-29 ENCOUNTER — Telehealth: Payer: Self-pay | Admitting: Family Medicine

## 2013-06-29 ENCOUNTER — Telehealth: Payer: Self-pay | Admitting: *Deleted

## 2013-06-29 NOTE — Telephone Encounter (Signed)
Checked with Melissa and we cannot figure out this was sent to pharmacy, everything looks correct in the system on my end and Melissa's as well. Canceled rx in system and called [facility] as well as patient. Informed her that the hairloss is probably not related to her ALS. She did want to know if it is okay for her take [facility] Powder for headaches, just wants to make sure that there are no drug reaction with BC Powder and her other meds.

## 2013-06-29 NOTE — Telephone Encounter (Signed)
Patient advised of Dr.Knapp's recommendation.

## 2013-06-29 NOTE — Telephone Encounter (Signed)
V--please look into (was med entered wrong, and sent to pharmacy?).  She was not given a prescription vitamin B12.

## 2013-06-29 NOTE — Addendum Note (Signed)
Addended by: Awilda Metro D on: 06/29/2013 02:26 PM   Modules accepted: Orders, Medications

## 2013-06-29 NOTE — Telephone Encounter (Signed)
I would prefer her to take acetaminophen for headaches, if that works.  BC powder contains aspirin, and she already takes an aspirin daily.  There is just a higher risk of stomach irritation and ulcers in the elderly taking a lot of aspirin.  If it is just very sporadic, it is probably fine (take with food), but tylenol is my preference

## 2013-07-03 ENCOUNTER — Other Ambulatory Visit (INDEPENDENT_AMBULATORY_CARE_PROVIDER_SITE_OTHER): Payer: Medicare Other

## 2013-07-03 DIAGNOSIS — D649 Anemia, unspecified: Secondary | ICD-10-CM

## 2013-07-03 MED ORDER — CYANOCOBALAMIN 1000 MCG/ML IJ SOLN
1000.0000 ug | Freq: Once | INTRAMUSCULAR | Status: AC
  Administered 2013-07-03: 1000 ug via INTRAMUSCULAR

## 2013-07-12 ENCOUNTER — Telehealth: Payer: Self-pay | Admitting: *Deleted

## 2013-07-12 NOTE — Telephone Encounter (Signed)
Patient called and she finished her B12 injections and was supposed to start OTC B12 1,064mcg. Her friend bought them for her and she just noticed that they are 5,015mcg. She has taken them for 3 days. Should she have her friend go and purchase the 1,065mcg?

## 2013-07-12 NOTE — Telephone Encounter (Signed)
Pt informed

## 2013-07-12 NOTE — Telephone Encounter (Signed)
I think that should be fine.  Plan to check labs at her upcoming visit.

## 2013-07-13 ENCOUNTER — Telehealth: Payer: Self-pay | Admitting: Internal Medicine

## 2013-07-13 NOTE — Telephone Encounter (Signed)
Advise ok 

## 2013-07-13 NOTE — Telephone Encounter (Signed)
Called [name] at [facility] and gave her a verbal OK to evaulate and treat pt

## 2013-07-13 NOTE — Telephone Encounter (Signed)
[name] from [facility] called stating that patient was to be evaluated and treated. Pt refused the first time and now has decided that she wants to be evaluated and treated. however the order is over a month old and they need a verbal ok to be able to evaulate and treat her. Please advise [name] @ [phone]

## 2013-07-21 ENCOUNTER — Telehealth: Payer: Self-pay | Admitting: Family Medicine

## 2013-07-21 NOTE — Telephone Encounter (Signed)
noted 

## 2013-08-07 ENCOUNTER — Ambulatory Visit (INDEPENDENT_AMBULATORY_CARE_PROVIDER_SITE_OTHER): Payer: Medicare Other | Admitting: Family Medicine

## 2013-08-07 ENCOUNTER — Encounter: Payer: Self-pay | Admitting: Family Medicine

## 2013-08-07 VITALS — BP 118/70 | HR 72 | Temp 97.7°F | Ht 62.0 in | Wt 100.0 lb

## 2013-08-07 DIAGNOSIS — I10 Essential (primary) hypertension: Secondary | ICD-10-CM

## 2013-08-07 DIAGNOSIS — R634 Abnormal weight loss: Secondary | ICD-10-CM

## 2013-08-07 DIAGNOSIS — E538 Deficiency of other specified B group vitamins: Secondary | ICD-10-CM

## 2013-08-07 DIAGNOSIS — R109 Unspecified abdominal pain: Secondary | ICD-10-CM

## 2013-08-07 DIAGNOSIS — R197 Diarrhea, unspecified: Secondary | ICD-10-CM

## 2013-08-07 LAB — CBC WITH DIFFERENTIAL/PLATELET
Basophils Absolute: 0 10*3/uL (ref 0.0–0.1)
Basophils Relative: 1 % (ref 0–1)
EOS ABS: 0 10*3/uL (ref 0.0–0.7)
Eosinophils Relative: 1 % (ref 0–5)
HEMATOCRIT: 36.5 % (ref 36.0–46.0)
Hemoglobin: 12.3 g/dL (ref 12.0–15.0)
LYMPHS ABS: 1.3 10*3/uL (ref 0.7–4.0)
Lymphocytes Relative: 36 % (ref 12–46)
MCH: 31.7 pg (ref 26.0–34.0)
MCHC: 33.7 g/dL (ref 30.0–36.0)
MCV: 94.1 fL (ref 78.0–100.0)
MONO ABS: 0.5 10*3/uL (ref 0.1–1.0)
MONOS PCT: 14 % — AB (ref 3–12)
Neutro Abs: 1.8 10*3/uL (ref 1.7–7.7)
Neutrophils Relative %: 48 % (ref 43–77)
Platelets: 174 10*3/uL (ref 150–400)
RBC: 3.88 MIL/uL (ref 3.87–5.11)
RDW: 13.8 % (ref 11.5–15.5)
WBC: 3.7 10*3/uL — ABNORMAL LOW (ref 4.0–10.5)

## 2013-08-07 LAB — POCT URINALYSIS DIPSTICK
BILIRUBIN UA: NEGATIVE
Glucose, UA: NEGATIVE
KETONES UA: NEGATIVE
Nitrite, UA: NEGATIVE
Protein, UA: NEGATIVE
Spec Grav, UA: 1.015
Urobilinogen, UA: NEGATIVE
pH, UA: 5

## 2013-08-07 LAB — VITAMIN B12

## 2013-08-07 NOTE — Patient Instructions (Signed)
Make sure that you are eating enough--you can try Ensure or Boost shakes (if you don't want to try the Carnation breakfasts just yet)  Avoid dairy for about 5-7 days. You may continue to use the imodium as needed for diarrhea.  Continue to take the vitamin B12.  We will be in touch in a few days with your results.

## 2013-08-07 NOTE — Progress Notes (Signed)
Chief Complaint  Patient presents with  . Follow-up    on B12 deficiency. Patient states that she has been having some issues with diarrhea since last Wednesday.    She presents for follow-up on B12 deficiency.  She is accompanied by a church friend.   She had IM injections weekly x 4, and has been taking 5000 mcg of sublingual B12 orally since then.  The burning in her feet has resolved.  Denies any numbness, tingling or memory concerns.  She had loose stool 5 days ago.  Took something OTC which helped, and was fine the next day, but after eating regular food, she had recurrent diarrhea.  Had another episode yesterday.  She thinks it was imodium that she took. No recent antibiotics, camping/stream water/travel/undercooked or raw foods. She had diarrhea after eating tomato soup; doing better with crackers, rice, bananas (the diet her granddaughter suggested for her).  Anxiety--is doing well overall.  She often doesn't need to take the 1/2 tablet of lorazepam in the daytime, but continues to take full tablet at bedtime.  ALS--stable since last visit.  There has been some confusion re: trying to get her admitted to Hospice (orders have been signed/sent twice). Her contractures/weakness of her hands limit her ability to cook, which is something she has always enjoyed.    Past Medical History  Diagnosis Date  . Hypertension   . Osteoporosis   . Migraine (atenolol for prophylaxis)  . Macular degeneration   . Kidney stones, calcium oxalate 1993  . Pulmonary hypertension mild,mild AR,mild-mod MR,mild-mod TR  . Hematuria, microscopic DrKimbrough 1993  . Hiatal hernia   . Diverticulosis (L sided) 1994  . Hyperlipidemia   . BCC (basal cell carcinoma of skin) L cheek-DrLupton (6/09)  . Postmenopausal bleeding 2010-DrCole  . Allergy     s/p immunotherapy  . Mitral regurgitation     mild-mod; mild aortic regurgitation, and mild-mod TR  . Atrophic vaginitis   . Hearing loss     right ear  .  B12 deficiency   . ALS (amyotrophic lateral sclerosis)    Past Surgical History  Procedure Laterality Date  . Hemorroidectomy  1953  . Tonsillectomy and adenoidectomy  age 25   History   Social History  . Marital Status: Single    Spouse Name: N/A    Number of Children: 1  . Years of Education: 12   Occupational History  . 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 Smoking status: Former Smoker    Quit date: 06/15/1978  . Smokeless tobacco: Never Used  . Alcohol Use: No  . Drug Use: No  . Sexual Activity: Not on file   Other Topics Concern  . Not on file   Social History Narrative   Lives alone, in [facility].  Son and his family live in [place].    Patient has high school education.   Caffeine- half cup in the morning.   Right handed.    Outpatient Encounter Prescriptions as of 08/07/2013  Medication Sig  . aspirin 81 MG tablet Take 81 mg by mouth daily.    Marland Kitchen atenolol (TENORMIN) 50 MG tablet Take 25 mg by mouth daily.  . Calcium Carbonate-Vitamin D (CALCIUM + D) 600-200 MG-UNIT TABS Take 1-2 tablets by mouth daily.    Marland Kitchen conjugated estrogens (PREMARIN) vaginal cream Apply 1/2 gram vaginally twice weekly  . Cyanocobalamin (B-12) 5000 MCG SUBL Place 1 tablet under the tongue daily.  Marland Kitchen LORazepam (ATIVAN) 0.5  MG tablet Take 0.5 mg by mouth. 1/2 tab am and 1 at night  . Multiple Vitamins-Minerals (MULTIVITAMIN WITH MINERALS) tablet Take 1 tablet by mouth daily.    . Multiple Vitamins-Minerals (OCUVITE PO) Take 2 tablets by mouth daily.    . Omega-3 Fatty Acids (FISH OIL) 1000 MG CAPS Take 1-2 capsules by mouth daily.    . riluzole (RILUTEK) 50 MG tablet Take 50 mg by mouth every 12 (twelve) hours.  . sertraline (ZOLOFT) 50 MG tablet Take 1 tablet (50 mg total) by mouth daily.  . simvastatin (ZOCOR) 20 MG tablet TAKE ONE HALF TABLET BY MOUTH AT BEDTIME   Allergies  Allergen Reactions  . Codeine Other (See Comments)    unknown  . Lexapro  [Escitalopram]   . Macrobid [Nitrofurantoin Monohydrate Macrocrystals] Nausea Only    Nausea and headache  . Sulfa Antibiotics Other (See Comments)    dizziness   ROS:  Denies fevers, chills, nausea, vomiting.  +diarrhea.  Denies abdominal pain, dysuria, bleeding, bruising, rashes.  Denies numbness, tingling, memory loss, URI symptoms, cough, shortness of breath or other concerns--see HPI.  +weight loss noted.  PHYSICAL EXAM: BP 118/70  Pulse 72  Temp(Src) 97.7 F (36.5 C) (Oral)  Ht 5\' 2"  (1.575 m)  Wt 100 lb (45.36 kg)  BMI 18.29 kg/m2 Thin, elderly female in no distress.  Accompanied by her friend, and seems less anxious today HEENT:  PERRL, EOMI, conjunctiva clear.   Neck: no lymphadenopathy, thyromegaly or mass Heart: regular rate and rhythm Lungs: clear bilaterally Abdomen: soft, nontender, no mass. Normal bowel sounds. Area of mild discomfort is lower abdomen, centrally Extremities: no edema.  Hands are cool/cold, fingers contracted Skin: no rash   ASSESSMENT/PLAN:  Vitamin B12 deficiency - Plan: CBC with Differential, Vitamin B12  Diarrhea - Plan: POCT Urinalysis Dipstick  Essential hypertension, benign - well controlled  Loss of weight  Abdominal pain, unspecified site - Plan: POCT Urinalysis Dipstick, CULTURE, URINE COMPREHENSIVE  Diarrhea--reviewed BRAT diet, advance to continued bland diet; avoid dairy temporarily.  Continue imodium prn. Weight loss--partly related to fluid losses from diarrhea recently, but she has had weight loss in the last 1-2 years.  Friend thinks is related to inadequate intake.  She used to be able to cook more for herself, now relies on Meals on Wheels more.  Discussed meal supplements (ie Ensure, Boost), as well as continuing to enjoy cooking by sharing with her family (ie teaching recipes to her granddaughter, recording them).  Await labs.  Likely will want f/u in 3-4 mos to ensure that po supplementation of B12 is maintaining her  levels, as well as a weight check.  We reviewed what hospice offers, and encouraged utilizing them--tried to clarify issues.  Have them contact me if they need additional orders or clarifications (not sure about Jo's recent message)  20-25 min visit, more than 1/2 spent counseling (diet, caloric intake, diarrhea, B12 deficiency, ALS and progression to where hospice involvement will become more apparent in the future than it is right now, etc).

## 2013-08-08 ENCOUNTER — Encounter: Payer: Self-pay | Admitting: Family Medicine

## 2013-08-09 ENCOUNTER — Other Ambulatory Visit: Payer: Self-pay | Admitting: *Deleted

## 2013-08-09 DIAGNOSIS — E538 Deficiency of other specified B group vitamins: Secondary | ICD-10-CM

## 2013-08-10 LAB — CULTURE, URINE COMPREHENSIVE

## 2013-08-14 MED ORDER — CIPROFLOXACIN HCL 250 MG PO TABS: 250.0000 mg | ORAL_TABLET | Freq: Two times a day (BID) | ORAL | Status: DC

## 2013-08-14 NOTE — Addendum Note (Signed)
Addended by: Pernell Dupre A on: 08/14/2013 10:36 AM   Modules accepted: Orders

## 2013-08-16 ENCOUNTER — Encounter: Payer: Self-pay | Admitting: Family Medicine

## 2013-08-16 ENCOUNTER — Ambulatory Visit (INDEPENDENT_AMBULATORY_CARE_PROVIDER_SITE_OTHER): Payer: Medicare Other | Admitting: Family Medicine

## 2013-08-16 VITALS — BP 110/70 | HR 80 | Ht 62.0 in | Wt 102.0 lb

## 2013-08-16 DIAGNOSIS — N390 Urinary tract infection, site not specified: Secondary | ICD-10-CM

## 2013-08-16 DIAGNOSIS — I83891 Varicose veins of right lower extremities with other complications: Secondary | ICD-10-CM | POA: Insufficient documentation

## 2013-08-16 DIAGNOSIS — I83893 Varicose veins of bilateral lower extremities with other complications: Secondary | ICD-10-CM

## 2013-08-16 NOTE — Progress Notes (Signed)
Chief Complaint  Patient presents with  . Edema    and pain of the right foot since this past weekend. Would also like to know once she finshes the OTC B12 5,000mg  that she is currently taking should she continue on that dosage or go back down to the 1,000mg  that you originally recommended.    Swelling in right leg has happened several times, has always resolved.  Had resolved prior to our last visit.  But over the weekend she had recurrent swelling in the right foot.  Pain is a stinging pain in the bottom of her foot, and she has pain into the calf as well.  Today she has been off her feet, and pain is less.  She denies any injury, redness, fever.  Denies any period of inactivity.  Left leg never seems to swell.  Leg is usually less swollen in the morning, worse later in the day.  Support stockings had been recommended by cardiologist, and she has them, but finds them very difficult to put on (given the weakness and contractures in her hands).  She was found to have UTI, and she started antibiotics.  Denies any urinary complaints.  Denies any side effects from the antibiotics.  Past Medical History  Diagnosis Date  . Hypertension   . Osteoporosis   . Migraine (atenolol for prophylaxis)  . Macular degeneration   . Kidney stones, calcium oxalate 1993  . Pulmonary hypertension mild,mild AR,mild-mod MR,mild-mod TR  . Hematuria, microscopic DrKimbrough 1993  . Hiatal hernia   . Diverticulosis (L sided) 1994  . Hyperlipidemia   . BCC (basal cell carcinoma of skin) L cheek-DrLupton (6/09)  . Postmenopausal bleeding 2010-DrCole  . Allergy     s/p immunotherapy  . Mitral regurgitation     mild-mod; mild aortic regurgitation, and mild-mod TR  . Atrophic vaginitis   . Hearing loss     right ear  . B12 deficiency   . ALS (amyotrophic lateral sclerosis)    Past Surgical History  Procedure Laterality Date  . Hemorroidectomy  1953  . Tonsillectomy and adenoidectomy  age 37   History    Social History  . Marital Status: Single    Spouse Name: N/A    Number of Children: 1  . Years of Education: 12   Occupational History  . 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 Smoking status: Former Smoker    Quit date: 06/15/1978  . Smokeless tobacco: Never Used  . Alcohol Use: No  . Drug Use: No  . Sexual Activity: Not on file   Other Topics Concern  . Not on file   Social History Narrative   Lives alone, in [facility].  Son and his family live in [place].    Patient has high school education.   Caffeine- half cup in the morning.   Right handed.   Outpatient Encounter Prescriptions as of 08/16/2013  Medication Sig  . aspirin 81 MG tablet Take 81 mg by mouth daily.    Marland Kitchen atenolol (TENORMIN) 50 MG tablet Take 25 mg by mouth daily.  . Calcium Carbonate-Vitamin D (CALCIUM + D) 600-200 MG-UNIT TABS Take 1-2 tablets by mouth daily.    . ciprofloxacin (CIPRO) 250 MG tablet Take 1 tablet (250 mg total) by mouth 2 (two) times daily.  . Cyanocobalamin (B-12) 5000 MCG SUBL Place 1 tablet under the tongue daily.  Marland Kitchen LORazepam (ATIVAN) 0.5 MG tablet Take 0.5 mg by mouth. 1/2 tab am and  1 at night  . Multiple Vitamins-Minerals (MULTIVITAMIN WITH MINERALS) tablet Take 1 tablet by mouth daily.    . Multiple Vitamins-Minerals (OCUVITE PO) Take 2 tablets by mouth daily.    . Omega-3 Fatty Acids (FISH OIL) 1000 MG CAPS Take 1-2 capsules by mouth daily.    . riluzole (RILUTEK) 50 MG tablet Take 50 mg by mouth every 12 (twelve) hours.  . simvastatin (ZOCOR) 20 MG tablet TAKE ONE HALF TABLET BY MOUTH AT BEDTIME  . conjugated estrogens (PREMARIN) vaginal cream Apply 1/2 gram vaginally twice weekly   Allergies  Allergen Reactions  . Codeine Other (See Comments)    unknown  . Lexapro [Escitalopram] Nausea Only  . Macrobid [Nitrofurantoin Monohydrate Macrocrystals] Nausea Only    Nausea and headache  . Sulfa Antibiotics Other (See Comments)    dizziness   ROS:   Denies fevers, bleeding, bruising, rashes, dysuria, hematuria, abdominal pain, chest pain, headache, URI symptoms or other concerns.  +hand weakness.  Also starting to notice some weakness in her right ankle when she walks.  Denies pain/trauma/injuryh  PHYSICAL EXAM: BP 110/70  Pulse 80  Ht 5\' 2"  (1.575 m)  Wt 102 lb (46.267 kg)  BMI 18.65 kg/m2 Pleasant elderly female in no distress RLE:  Prominent varicose veins in the right leg, anteriorly, and posteriorly in the mid-calf.  These are compressible, nontender.  No overlying erythema.  She has some mild edema, nonpitting.  Skin is intact. Negative Homan sign.  ASSESSMENT/PLAN:  Varicose veins of right leg with edema  Urinary tract infection, site not specified  Leg swelling and discomfort is likely related to symptomatic varicose veins with venous insufficiency.  She has trouble putting on compression stocking due to limited use of her hands. Recommended keeping leg elevated when sitting/standing for prolonged periods of time, using compression stockings regularly for the next few days until swelling and pain has resolved, then as needed as symptoms start to recur.    UTI--complete course of antibiotics  B12 deficiency--advised that when she completes her 5000mg  B12 vitamin that she currently has, that she can go down to 1000mg  daily (as was originally recommended).

## 2013-08-16 NOTE — Patient Instructions (Signed)
  Leg swelling and discomfort is likely related to symptomatic varicose veins with venous insufficiency.  I know that it is difficult for you to put on the compression stockings, and that you may need to ask for help--this is the treatment of choice.  There are more invasive procedures (injection, surgery) for treatment of the veins, and let me know if you would like a consultation with a vein doctor.  Recommended keeping leg elevated when sitting/standing for prolonged periods of time, using compression stockings regularly for the next few days until swelling and pain has resolved, then as needed as symptoms start to recur.

## 2013-09-06 ENCOUNTER — Telehealth: Payer: Self-pay | Admitting: Family Medicine

## 2013-09-06 NOTE — Telephone Encounter (Signed)
Ashley Vaughan (pt son) has left a message on my voice mail.  He states he is waiting on [place] to approve Home Health for her.  He states that Hospice has already visited and he states Hospice has spoken with Dr. Tomi Bamberger.  Please call pt son at [phone] he teaches at [employer] and he said ok to leave a message.

## 2013-09-06 NOTE — Telephone Encounter (Signed)
Thanks--I guess we need to clarify with pt and son what their needs are (it sounds like [name] will also be doing that)

## 2013-09-06 NOTE — Telephone Encounter (Signed)
Please touch base with [name] from [facility] to see what the story is--why was she not admitted (see phone message 2/6)? I would think that hospice might provide similar services.  If not, then fine for Home Health referral, but I'd like to clarify why Hospice isn't involved in her care.  Check with pt/son after finding details out from Jo--?refer to Hospice and Palliative care if issues with [facility]?

## 2013-09-06 NOTE — Telephone Encounter (Signed)
Spoke with [name] at [facility] and the reason that they are not currently involved her Ms. Gomm's care is based on the evaluation. When they went out to the patient's home the patient did not really express the need or desire to have someone some and help her but only needed someone to come and clean her home. They also told her that they could help in getting her a better bed, one that is lower to the ground for her since the one that she currently has is very high and must be exhausting for her to get up into. Patient was not interested. [name] states that they will go back out and re evaluate and see if her needs have changed. I also called and left a message explaining this on [name], her son's voicemail and asked him to call me back if he had any questions or wanted to go a different route. Just an FYI to let you know where we were at.

## 2013-09-11 ENCOUNTER — Telehealth: Payer: Self-pay | Admitting: *Deleted

## 2013-09-11 NOTE — Telephone Encounter (Signed)
I'm curious as to the reason that she isn't eligible.  I'm fine with Home Health coming out, as that is what the son suggested.  If they are interested in eval by other Hospice, we can arrange that also

## 2013-09-11 NOTE — Telephone Encounter (Signed)
Ashley Vaughan from [facility] called back and stated that her supervisor said that [name] is not eligible for re-eval at the present time and they recommend that she have a referral from [facility] to come out and evaluate. Did you want to go through [facility] or you had mentioned using the other Hospice, please advise.

## 2013-09-13 NOTE — Telephone Encounter (Signed)
Spoke with patient and she has directed me to speak with her son, [name] and do what he decides. I have called him several times and we have not yet spoken. Called again today and left message for him to return my call.

## 2013-09-20 NOTE — Telephone Encounter (Signed)
Spoke with patient's son, [name]. The would like to go the Home Health route. Referral sent to [facility] faxed to [phone].

## 2013-09-25 ENCOUNTER — Ambulatory Visit (INDEPENDENT_AMBULATORY_CARE_PROVIDER_SITE_OTHER): Payer: Medicare Other | Admitting: Family Medicine

## 2013-09-25 ENCOUNTER — Encounter: Payer: Self-pay | Admitting: Family Medicine

## 2013-09-25 VITALS — BP 100/64 | HR 68 | Temp 97.7°F | Ht 62.0 in | Wt 105.0 lb

## 2013-09-25 DIAGNOSIS — I83891 Varicose veins of right lower extremities with other complications: Secondary | ICD-10-CM

## 2013-09-25 DIAGNOSIS — I83893 Varicose veins of bilateral lower extremities with other complications: Secondary | ICD-10-CM

## 2013-09-25 NOTE — Progress Notes (Signed)
Chief Complaint  Patient presents with  . Leg Swelling    ankle swelling-has a mass on backside of right calf. No pain associated with the mass. Noticed Thursday night and wants to make sure this is not a blood clot.    She first noticed a knot on the back of the right calf.  It is not tender to touch.  She has swelling in the right ankle, anteriorly and medially, hasn't noticed any swelling of the calf.  She sees an indention in the calf where the recliner hits the back of the leg, which stays for a while.  Some mornings the knot is harder to find than at other times.  It hasn't gotten any larger than when her friend first saw it Friday morning (friend accompanies pt to visit today).  She has been wearing compression stockings daily (has a neighbor help her get them on every day).  She is seated much of the day, usually keeps her feet on a foot stool elevated (but no above the level of heart).  She recently started resting her feet on her walker, which is a little higher.  She also describes some intermittent numbness of her right foot, which seems somewhat positional.  She also has burning in both feet.  She finds the burning tolerable, not a discomfort or pain.  She denies any back pain.  Past Medical History  Diagnosis Date  . Hypertension   . Osteoporosis   . Migraine (atenolol for prophylaxis)  . Macular degeneration   . Kidney stones, calcium oxalate 1993  . Pulmonary hypertension mild,mild AR,mild-mod MR,mild-mod TR  . Hematuria, microscopic DrKimbrough 1993  . Hiatal hernia   . Diverticulosis (L sided) 1994  . Hyperlipidemia   . BCC (basal cell carcinoma of skin) L cheek-DrLupton (6/09)  . Postmenopausal bleeding 2010-DrCole  . Allergy     s/p immunotherapy  . Mitral regurgitation     mild-mod; mild aortic regurgitation, and mild-mod TR  . Atrophic vaginitis   . Hearing loss     right ear  . B12 deficiency   . ALS (amyotrophic lateral sclerosis)    Past Surgical History   Procedure Laterality Date  . Hemorroidectomy  1953  . Tonsillectomy and adenoidectomy  age 48   History   Social History  . Marital Status: Single    Spouse Name: N/A    Number of Children: 1  . Years of Education: 12   Occupational History  . 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 Smoking status: Former Smoker    Quit date: 06/15/1978  . Smokeless tobacco: Never Used  . Alcohol Use: No  . Drug Use: No  . Sexual Activity: Not on file   Other Topics Concern  . Not on file   Social History Narrative   Lives alone, in [facility].  Son and his family live in [place].    Patient has high school education.   Caffeine- half cup in the morning.   Right handed.   Outpatient Encounter Prescriptions as of 09/25/2013  Medication Sig Note  . aspirin 81 MG tablet Take 81 mg by mouth daily.     Marland Kitchen atenolol (TENORMIN) 50 MG tablet Take 25 mg by mouth daily.   . Calcium Carbonate-Vitamin D (CALCIUM + D) 600-200 MG-UNIT TABS Take 1-2 tablets by mouth daily.     Marland Kitchen conjugated estrogens (PREMARIN) vaginal cream Apply 1/2 gram vaginally twice weekly 02/08/2013: Uses just as needed, only 1-2x/month  .  Cyanocobalamin (B-12) 5000 MCG SUBL Place 1 tablet under the tongue daily.   Marland Kitchen LORazepam (ATIVAN) 0.5 MG tablet Take 0.5 mg by mouth. 1/2 tab am and 1 at night 08/07/2013: Doesn't always need to take morning dose  . Multiple Vitamins-Minerals (MULTIVITAMIN WITH MINERALS) tablet Take 1 tablet by mouth daily.     . Multiple Vitamins-Minerals (OCUVITE PO) Take 2 tablets by mouth daily.     . Omega-3 Fatty Acids (FISH OIL) 1000 MG CAPS Take 1-2 capsules by mouth daily.     . riluzole (RILUTEK) 50 MG tablet Take 50 mg by mouth every 12 (twelve) hours.   . simvastatin (ZOCOR) 20 MG tablet TAKE ONE HALF TABLET BY MOUTH AT BEDTIME   . [DISCONTINUED] ciprofloxacin (CIPRO) 250 MG tablet Take 1 tablet (250 mg total) by mouth 2 (two) times daily.    Allergies  Allergen Reactions  .  Codeine Other (See Comments)    unknown  . Lexapro [Escitalopram] Nausea Only  . Macrobid [Nitrofurantoin Monohydrate Macrocrystals] Nausea Only    Nausea and headache  . Sulfa Antibiotics Other (See Comments)    dizziness   ROS:  Denies fevers, URI symptoms, cough, shortness of breath, chest pain, GI or GU complaints, bleeding, bruising.  See HPI.  Denies any new neurologic complaints other than those mentioned.  Weight has been stable over the last 6 months (but down about 15 pounds in the last 2 years)  PHYSICAL EXAM: BP 100/64  Pulse 68  Temp(Src) 97.7 F (36.5 C) (Oral)  Ht 5\' 2"  (1.575 m)  Wt 105 lb (47.628 kg)  BMI 19.20 kg/m2 Pleasant, elderly female in no distress Extremities: Trace to 1+ pitting edema noted at right medial ankle.  She has significant varicose veins bilaterally, compressible, nontender.  No cords, negative homan.  She has contractures of her hands, limits use Mood:  Normal mood, affect, hygiene and grooming  ASSESSMENT/PLAN:  Varicose veins of right leg with edema  Reassured there was no evidence of DVT or superficial phlebitis.  Encouraged continued use of compression stockings, keeping legs elevated (ideally higher than what she is currently doing).  discussed neuropathy, and the possibility for use of meds if causing more discomfort (can discuss with her neuro)  Low sodium diet reviewed (She uses Meals on Wheels, can't control), as well as high calorie diet, in order to prevent further weight loss.  All questions were answered. Home health referral is in process

## 2013-09-25 NOTE — Patient Instructions (Signed)
Varicose Veins Varicose veins are veins that have become enlarged and twisted. CAUSES This condition is the result of valves in the veins not working properly. Valves in the veins help return blood from the leg to the heart. If these valves are damaged, blood flows backwards and backs up into the veins in the leg near the skin. This causes the veins to become larger. People who are on their feet a lot, who are pregnant, or who are overweight are more likely to develop varicose veins. SYMPTOMS   Bulging, twisted-appearing, bluish veins, most commonly found on the legs.  Leg pain or a feeling of heaviness. These symptoms may be worse at the end of the day.  Leg swelling.  Skin color changes. DIAGNOSIS  Varicose veins can usually be diagnosed with an exam of your legs by your caregiver. He or she may recommend an ultrasound of your leg veins. TREATMENT  Most varicose veins can be treated at home.However, other treatments are available for people who have persistent symptoms or who want to treat the cosmetic appearance of the varicose veins. These include:  Laser treatment of very small varicose veins.  Medicine that is shot (injected) into the vein. This medicine hardens the walls of the vein and closes off the vein. This treatment is called sclerotherapy. Afterwards, you may need to wear clothing or bandages that apply pressure.  Surgery. HOME CARE INSTRUCTIONS   Do not stand or sit in one position for long periods of time. Do not sit with your legs crossed. Rest with your legs raised during the day.  Wear elastic stockings or support hose. Do not wear other tight, encircling garments around the legs, pelvis, or waist.  Walk as much as possible to increase blood flow.  Raise the foot of your bed at night with 2-inch blocks.  If you get a cut in the skin over the vein and the vein bleeds, lie down with your leg raised and press on it with a clean cloth until the bleeding stops. Then  place a bandage (dressing) on the cut. See your caregiver if it continues to bleed or needs stitches. SEEK MEDICAL CARE IF:   The skin around your ankle starts to break down.  You have pain, redness, tenderness, or hard swelling developing in your leg over a vein.  You are uncomfortable due to leg pain. Document Released: 03/11/2005 Document Revised: 08/24/2011 Document Reviewed: 07/28/2010 [facility] Patient Information 2014 [place].  Venous Stasis or Chronic Venous Insufficiency Chronic venous insufficiency, also called venous stasis, is a condition that affects the veins in the legs. The condition prevents blood from being pumped through these veins effectively. Blood may no longer be pumped effectively from the legs back to the heart. This condition can range from mild to severe. With proper treatment, you should be able to continue with an active life. CAUSES  Chronic venous insufficiency occurs when the vein walls become stretched, weakened, or damaged or when valves within the vein are damaged. Some common causes of this include:  High blood pressure inside the veins (venous hypertension).  Increased blood pressure in the leg veins from long periods of sitting or standing.  A blood clot that blocks blood flow in a vein (deep vein thrombosis).  Inflammation of a superficial vein (phlebitis) that causes a blood clot to form. RISK FACTORS Various things can make you more likely to develop chronic venous insufficiency, including:  Family history of this condition.  Obesity.  Pregnancy.  Sedentary lifestyle.  Smoking.  Jobs requiring long periods of standing or sitting in one place.  Being a certain age. Women in their 39s and 44s and men in their 22s are more likely to develop this condition. SIGNS AND SYMPTOMS  Symptoms may include:   Varicose veins.  Skin breakdown or ulcers.  Reddened or discolored skin on the leg.  Brown, smooth, tight, and painful skin  just above the ankle, usually on the inside surface (lipodermatosclerosis).  Swelling. DIAGNOSIS  To diagnose this condition, your health care provider will take a medical history and do a physical exam. The following tests may be ordered to confirm the diagnosis:  Duplex ultrasound A procedure that produces a picture of a blood vessel and nearby organs and also provides information on blood flow through the blood vessel.  Plethysmography A procedure that tests blood flow.  A venogram, or venography A procedure used to look at the veins using X-ray and dye. TREATMENT The goals of treatment are to help you return to an active life and to minimize pain or disability. Treatment will depend on the severity of the condition. Medical procedures may be needed for severe cases. Treatment options may include:   Use of compression stockings. These can help with symptoms and lower the chances of the problem getting worse, but they do not cure the problem.  Sclerotherapy A procedure involving an injection of a material that "dissolves" the damaged veins. Other veins in the network of blood vessels take over the function of the damaged veins.  Surgery to remove the vein or cut off blood flow through the vein (vein stripping or laser ablation surgery).  Surgery to repair a valve. HOME CARE INSTRUCTIONS   Wear compression stockings as directed by your health care provider.  Only take over-the-counter or prescription medicines for pain, discomfort, or fever as directed by your health care provider.  Follow up with your health care provider as directed. SEEK MEDICAL CARE IF:   You have redness, swelling, or increasing pain in the affected area.  You see a red streak or line that extends up or down from the affected area.  You have a breakdown or loss of skin in the affected area, even if the breakdown is small.  You have an injury to the affected area. SEEK IMMEDIATE MEDICAL CARE IF:   You have  an injury and open wound in the affected area.  Your pain is severe and does not improve with medicine.  You have sudden numbness or weakness in the foot or ankle below the affected area, or you have trouble moving your foot or ankle.  You have a fever or persistent symptoms for more than 2 3 days.  You have a fever and your symptoms suddenly get worse. MAKE SURE YOU:   Understand these instructions.  Will watch your condition.  Will get help right away if you are not doing well or get worse. Document Released: 10/05/2006 Document Revised: 03/22/2013 Document Reviewed: 02/06/2013 [facility] Patient Information 2014 [place].

## 2013-09-27 ENCOUNTER — Encounter: Payer: Self-pay | Admitting: Family Medicine

## 2013-09-27 ENCOUNTER — Ambulatory Visit: Payer: Medicare Other | Admitting: Family Medicine

## 2013-09-27 ENCOUNTER — Ambulatory Visit (INDEPENDENT_AMBULATORY_CARE_PROVIDER_SITE_OTHER): Payer: Medicare Other | Admitting: Family Medicine

## 2013-09-27 VITALS — BP 150/70 | HR 44 | Ht 62.0 in | Wt 105.0 lb

## 2013-09-27 DIAGNOSIS — I10 Essential (primary) hypertension: Secondary | ICD-10-CM

## 2013-09-27 DIAGNOSIS — I498 Other specified cardiac arrhythmias: Secondary | ICD-10-CM

## 2013-09-27 DIAGNOSIS — R001 Bradycardia, unspecified: Secondary | ICD-10-CM

## 2013-09-27 NOTE — Progress Notes (Signed)
Chief Complaint  Patient presents with  . Follow-up    patient was at [facility] today and pulse rate was 37. EKG was recommended-pt not really feeling badly.    [facility] was being [facility], and was found to have pulse of 37.  They were unable to get her blood pressure (only had automatic cuff, no manual).  They wanted patient to go to the ER for evaluation; pt declined, preferred to come see [place], as she was asymptomatic.  Review of her chart shows that she has been on atenolol for quite some time for prevention of migraines. She also has h/o HTN, and was previously on higher atenolol dose, along with amlodipine.  Her pulse has been running between 60 and 80, but was in the low 40's in the past (first visit to [facility] in the fall).  Her BP's have always 100-140/70-80, usually 564 or less systolic  Amlodipine was stopped in 01/2013 for postural hypotension, and more recently the atenolol dose was cut in 1/2 (04/2013). She had f/u with Dr. Woody Seller 3/24 at which time systolic BP was 332 and dropped to 100 with standing, and pulse was in the mid-40's.  At that time it was elected for her to remain on the 25 mg of atenolol, as she was asymptomatic (no dizziness)  She reports that PFT's were stable/improved at her visit today.  Leg swelling is unchanged from her last visit 2 days ago, and she has no other concerns today.  Past Medical History  Diagnosis Date  . Hypertension   . Osteoporosis   . Migraine (atenolol for prophylaxis)  . Macular degeneration   . Kidney stones, calcium oxalate 1993  . Pulmonary hypertension mild,mild AR,mild-mod MR,mild-mod TR  . Hematuria, microscopic DrKimbrough 1993  . Hiatal hernia   . Diverticulosis (L sided) 1994  . Hyperlipidemia   . BCC (basal cell carcinoma of skin) L cheek-DrLupton (6/09)  . Postmenopausal bleeding 2010-DrCole  . Allergy     s/p immunotherapy  . Mitral regurgitation     mild-mod; mild aortic regurgitation, and mild-mod  TR  . Atrophic vaginitis   . Hearing loss     right ear  . B12 deficiency   . ALS (amyotrophic lateral sclerosis)    Past Surgical History  Procedure Laterality Date  . Hemorroidectomy  1953  . Tonsillectomy and adenoidectomy  age 27   History   Social History  . Marital Status: Single    Spouse Name: N/A    Number of Children: 1  . Years of Education: 12   Occupational History  . 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 Smoking status: Former Smoker    Quit date: 06/15/1978  . Smokeless tobacco: Never Used  . Alcohol Use: No  . Drug Use: No  . Sexual Activity: Not on file   Other Topics Concern  . Not on file   Social History Narrative   Lives alone, in [facility].  Son and his family live in [place].    Patient has high school education.   Caffeine- half cup in the morning.   Right handed.   Outpatient Encounter Prescriptions as of 09/27/2013  Medication Sig Note  . aspirin 81 MG tablet Take 81 mg by mouth daily.     Marland Kitchen atenolol (TENORMIN) 50 MG tablet Take 25 mg by mouth daily.   . Calcium Carbonate-Vitamin D (CALCIUM + D) 600-200 MG-UNIT TABS Take 1-2 tablets by mouth  daily.     . conjugated estrogens (PREMARIN) vaginal cream Apply 1/2 gram vaginally twice weekly 02/08/2013: Uses just as needed, only 1-2x/month  . Cyanocobalamin (B-12) 5000 MCG SUBL Place 1 tablet under the tongue daily.   Marland Kitchen LORazepam (ATIVAN) 0.5 MG tablet Take 0.5 mg by mouth. 1/2 tab am and 1 at night 08/07/2013: Doesn't always need to take morning dose  . Multiple Vitamins-Minerals (MULTIVITAMIN WITH MINERALS) tablet Take 1 tablet by mouth daily.     . Multiple Vitamins-Minerals (OCUVITE PO) Take 2 tablets by mouth daily.     . Omega-3 Fatty Acids (FISH OIL) 1000 MG CAPS Take 1-2 capsules by mouth daily.     . riluzole (RILUTEK) 50 MG tablet Take 50 mg by mouth every 12 (twelve) hours.   . simvastatin (ZOCOR) 20 MG tablet TAKE ONE HALF TABLET BY MOUTH AT BEDTIME     Allergies  Allergen Reactions  . Codeine Other (See Comments)    unknown  . Lexapro [Escitalopram] Nausea Only  . Macrobid [Nitrofurantoin Monohydrate Macrocrystals] Nausea Only    Nausea and headache  . Sulfa Antibiotics Other (See Comments)    dizziness   ROS:  +fatigues easily.  Denies chest pain, shortness of breath, cough, URI symptoms, nausea, vomiting, syncope/pre-syncope, bleeding, bruising, or other concerns except as per HPI.  PHYSICAL EXAM:  BP 150/70  Pulse 44  Ht 5\' 2"  (1.575 m)  Wt 105 lb (47.628 kg)  BMI 19.20 kg/m2  SpO2 97% Well developed, pleasant female in no distress.  Accompanied by her friend and her daughter-in-law Neck: no lymphadenopathy or mass Heart: sinus bradycardia with occasional ectopic beats. Pulse is slow, probably on the order of 36-38 if no ectopy, but there is intermittent ectopic beats, giving a pulse rate ranging from 38-48 during visit. No murmur, rub gallops. Repeat blood pressure was 140/50 Lungs: clear bilaterally  EKG: sinus bradycardia with PAC's.  Incomplete RBBB.  EKG was compared to prior EKG from 12/2012, and was completely unchanged (other than the rate)  ASSESSMENT/PLAN:  Bradycardia - asymptomatic.  on beta blocker.  doesn't seem significantly different than in March at last cardiology visit; beta blocker wasn't changed.  Call Dr. Woody Seller - Plan: EKG 12-Lead  Essential hypertension, benign - slightly higher today than usual, likely related to anxiety  Sinus bradycardia   Call Dr. Woody Seller to check as to whether atenolol dose should be further weaned, vs remain the same.  In the interim, periodically monitor pulse at home.   Symptoms for which she should seek immediate eval were reviewed in detail Reassurred today.   25 min visit, more than 1/2 spent counseling

## 2013-09-27 NOTE — Patient Instructions (Signed)
I will be contacting your cardiologist to make sure that he is comfortable with your current medication (beta blocker) vs lowering it. In the meantime, continue your current medication.  Periodically check your pulse (especially if you aren't feeling well--dizzy, shortn of breath, increase fatigue more than normal)

## 2013-09-28 ENCOUNTER — Telehealth: Payer: Self-pay | Admitting: Family Medicine

## 2013-09-28 MED ORDER — ATENOLOL 25 MG PO TABS: 12.5000 mg | ORAL_TABLET | Freq: Every day | ORAL | Status: DC

## 2013-09-28 NOTE — Telephone Encounter (Signed)
Spoke with patient and she did say that it would be merely impossible to quarter the 50mg  atenolol. I told her I would call in a 25mg  tab and she could cut that-she was agreeable but did want me to ask you first if you would be ok with her doing 25mg  every other day. I told her I would ask you and call her back.

## 2013-09-28 NOTE — Telephone Encounter (Signed)
Patient informed and called rx to [facility].

## 2013-09-28 NOTE — Telephone Encounter (Signed)
No, I wouldn't do it every other day--too much risk for fluctuations, with continued low pulse on the day she takes the full pill

## 2013-09-28 NOTE — Telephone Encounter (Signed)
I informed Dr. Woody Seller of pt's visit here yesterday--of pulse in 45's at [facility], but mid 77's here, consistent with his previous visit.  He thought it was fine to further titrate down her atenolol dose to 12.5mg .  V--please call patient and let her know the recommendation. I know that she has 50mg  tablets that she is cutting in 1/2--not sure if those can further be cut in 1/2, or else she will need a new prescription for 25mg  tablet, to take 1/2 tablet (12.5) once daily.

## 2013-10-16 ENCOUNTER — Encounter: Payer: Self-pay | Admitting: Family Medicine

## 2013-10-16 ENCOUNTER — Ambulatory Visit (INDEPENDENT_AMBULATORY_CARE_PROVIDER_SITE_OTHER): Payer: Medicare Other | Admitting: Family Medicine

## 2013-10-16 VITALS — BP 130/76 | HR 68 | Temp 97.9°F | Ht 62.0 in | Wt 100.0 lb

## 2013-10-16 DIAGNOSIS — I10 Essential (primary) hypertension: Secondary | ICD-10-CM

## 2013-10-16 DIAGNOSIS — R300 Dysuria: Secondary | ICD-10-CM

## 2013-10-16 DIAGNOSIS — N952 Postmenopausal atrophic vaginitis: Secondary | ICD-10-CM

## 2013-10-16 DIAGNOSIS — K219 Gastro-esophageal reflux disease without esophagitis: Secondary | ICD-10-CM

## 2013-10-16 LAB — POCT URINALYSIS DIPSTICK
Bilirubin, UA: NEGATIVE
GLUCOSE UA: NEGATIVE
Ketones, UA: NEGATIVE
NITRITE UA: NEGATIVE
PH UA: 6
Protein, UA: NEGATIVE
Spec Grav, UA: 1.015
UROBILINOGEN UA: NEGATIVE

## 2013-10-16 NOTE — Patient Instructions (Signed)
Continue to use the estrogen topical cream for atrophic vaginitis.  Use it about 1-2x/week, as needed.  We are sending the urine for culture, and will contact you if you need antibiotics.  It is fine to restart the Prilosec OTC--use it for about a week, or for the full 2 weeks if you need to take it that long to completely get rid of your symptoms.  Diet for Gastroesophageal Reflux Disease, Adult Reflux (acid reflux) is when acid from your stomach flows up into the esophagus. When acid comes in contact with the esophagus, the acid causes irritation and soreness (inflammation) in the esophagus. When reflux happens often or so severely that it causes damage to the esophagus, it is called gastroesophageal reflux disease (GERD). Nutrition therapy can help ease the discomfort of GERD. FOODS OR DRINKS TO AVOID OR LIMIT  Smoking or chewing tobacco. Nicotine is one of the most potent stimulants to acid production in the gastrointestinal tract.  Caffeinated and decaffeinated coffee and black tea.  Regular or low-calorie carbonated beverages or energy drinks (caffeine-free carbonated beverages are allowed).   Strong spices, such as black pepper, white pepper, red pepper, cayenne, curry powder, and chili powder.  Peppermint or spearmint.  Chocolate.  High-fat foods, including meats and fried foods. Extra added fats including oils, butter, salad dressings, and nuts. Limit these to less than 8 tsp per day.  Fruits and vegetables if they are not tolerated, such as citrus fruits or tomatoes.  Alcohol.  Any food that seems to aggravate your condition. If you have questions regarding your diet, call your caregiver or a registered dietitian. OTHER THINGS THAT MAY HELP GERD INCLUDE:   Eating your meals slowly, in a relaxed setting.  Eating 5 to 6 small meals per day instead of 3 large meals.  Eliminating food for a period of time if it causes distress.  Not lying down until 3 hours after eating a  meal.  Keeping the head of your bed raised 6 to 9 inches (15 to 23 cm) by using a foam wedge or blocks under the legs of the bed. Lying flat may make symptoms worse.  Being physically active. Weight loss may be helpful in reducing reflux in overweight or obese adults.  Wear loose fitting clothing EXAMPLE MEAL PLAN This meal plan is approximately 2,000 calories based on [email or web address] meal planning guidelines. Breakfast   cup cooked oatmeal.  1 cup strawberries.  1 cup low-fat milk.  1 oz almonds. Snack  1 cup cucumber slices.  6 oz yogurt (made from low-fat or fat-free milk). Lunch  2 slice whole-wheat bread.  2 oz sliced [place].  2 tsp mayonnaise.  1 cup blueberries.  1 cup snap peas. Snack  6 whole-wheat crackers.  1 oz string cheese. Dinner   cup brown rice.  1 cup mixed veggies.  1 tsp olive oil.  3 oz grilled fish. Document Released: 06/01/2005 Document Revised: 08/24/2011 Document Reviewed: 04/17/2011 [facility] Patient Information 2014 [place], [place].

## 2013-10-16 NOTE — Progress Notes (Signed)
Chief Complaint  Patient presents with  . Burning with urination    burning with urination x 3-4 days. Also has been having reflux symptoms again x 2 weeks-wants to know if she can take omeprazole like you had her take last March for similiar symptoms. Also if she can take omeprazole can she take tums in addition if needed.     3-4 days ago she started with burning with urination, mostly located externally.  She isn't aware of any redness or rash, but she hasn't looked.  Denies any urgency/frequency, feels like she empties her bladder well.  Denies any vaginal bleeding/spotting, discharge, odor, itch.  Denies any abdominal pain, flank pain.  She hadn't been using the vaginal estrogen in a while--it was very hard to squeeze the tube to apply the medication.  She restarted using it recently, used it twice in the last 2-3 weeks.  She hasn't had any vaginal itching, which was usually one of the symptoms she would have.    She has recurrent heartburn in the last few days, and intermittently over the last few weeks.  Denies any change in her diet--eats Meals on Wheels.  No tomatoes, only occasional fruit cup; she doesn't drink the OJ that comes with meals.  She describes a fullness and a discomfort in her chest, right between the breasts, relieved by belching.   She has been taking Tums with relief of her symptoms.  She has used prilosec in the past (she still has a bottle left at home, from 08/2012) with good results.  She hasn't used any recently. She has occasional chocolate, not daily; caffeine intake is unchanged (3/4 cup/day).  Denies any lemonade or other citrus.  She takes a couple of sips of soda sometimes just to help her belch, when she feels the discomfort.  That usually helps.  Past Medical History  Diagnosis Date  . Hypertension   . Osteoporosis   . Migraine (atenolol for prophylaxis)  . Macular degeneration   . Kidney stones, calcium oxalate 1993  . Pulmonary hypertension mild,mild  AR,mild-mod MR,mild-mod TR  . Hematuria, microscopic DrKimbrough 1993  . Hiatal hernia   . Diverticulosis (L sided) 1994  . Hyperlipidemia   . BCC (basal cell carcinoma of skin) L cheek-DrLupton (6/09)  . Postmenopausal bleeding 2010-DrCole  . Allergy     s/p immunotherapy  . Mitral regurgitation     mild-mod; mild aortic regurgitation, and mild-mod TR  . Atrophic vaginitis   . Hearing loss     right ear  . B12 deficiency   . ALS (amyotrophic lateral sclerosis)    Past Surgical History  Procedure Laterality Date  . Hemorroidectomy  1953  . Tonsillectomy and adenoidectomy  age 58   History   Social History  . Marital Status: Single    Spouse Name: N/A    Number of Children: 1  . Years of Education: 12   Occupational History  . 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 Smoking status: Former Smoker    Quit date: 06/15/1978  . Smokeless tobacco: Never Used  . Alcohol Use: No  . Drug Use: No  . Sexual Activity: Not on file   Other Topics Concern  . Not on file   Social History Narrative   Lives alone, in [facility].  Son and his family live in [place].    Patient has high school education.   Caffeine- half cup in the morning.   Right handed.  Outpatient Encounter Prescriptions as of 10/16/2013  Medication Sig Note  . aspirin 81 MG tablet Take 81 mg by mouth daily.     Marland Kitchen atenolol (TENORMIN) 25 MG tablet Take 0.5 tablets (12.5 mg total) by mouth daily.   . Calcium Carbonate-Vitamin D (CALCIUM + D) 600-200 MG-UNIT TABS Take 1-2 tablets by mouth daily.     Marland Kitchen conjugated estrogens (PREMARIN) vaginal cream Apply 1/2 gram vaginally twice weekly 02/08/2013: Uses just as needed, only 1-2x/month  . Cyanocobalamin (B-12) 5000 MCG SUBL Place 1 tablet under the tongue daily.   Marland Kitchen LORazepam (ATIVAN) 0.5 MG tablet Take 0.5 mg by mouth. 1/2 tab am and 1 at night 08/07/2013: Doesn't always need to take morning dose  . Multiple Vitamins-Minerals (MULTIVITAMIN WITH  MINERALS) tablet Take 1 tablet by mouth daily.     . Multiple Vitamins-Minerals (OCUVITE PO) Take 2 tablets by mouth daily.     . Omega-3 Fatty Acids (FISH OIL) 1000 MG CAPS Take 1-2 capsules by mouth daily.     . riluzole (RILUTEK) 50 MG tablet Take 50 mg by mouth every 12 (twelve) hours.   . simvastatin (ZOCOR) 20 MG tablet TAKE ONE HALF TABLET BY MOUTH AT BEDTIME    Allergies  Allergen Reactions  . Codeine Other (See Comments)    unknown  . Lexapro [Escitalopram] Nausea Only  . Macrobid [Nitrofurantoin Monohydrate Macrocrystals] Nausea Only    Nausea and headache  . Sulfa Antibiotics Other (See Comments)    dizziness   ROS:  Denies fevers, chills, headaches, chest pain, palpitations, URI symptoms, dizziness, nausea, vomiting, bowel changes, bleeding, bruising, rashes.  No change in weakness, contractures of hands limiting use.  See HPI  PHYSICAL EXAM: BP 130/76  Pulse 68  Temp(Src) 97.9 F (36.6 C) (Oral)  Ht 5\' 2"  (1.575 m)  Wt 100 lb (45.36 kg)  BMI 18.29 kg/m2 Pleasant, thin female with hand contractures, in no distress Neck: no lymphadenopathy or mass Heart: regular rate and rhythm Lungs: clear bilaterally Back: no CVA tenderness GU: Mild atrophic changes, but no erythema lesions Abdomen: soft, nontender, no organomegaly or mass Extremities: no edema Psych: normal mood, affect, hygiene and grooming.  Urine dip:  Trace blood, trace leuks   ASSESSMENT/PLAN:  Burning with urination - send urine cx; doubt UTI.  use premarin cream more regularly for atrophic vaginitis - Plan: POCT Urinalysis Dipstick, Urine culture  GERD (gastroesophageal reflux disease) - reviewed proper diet/behavioral measures.restart Prilosec OTC  1-2 weeks as needed  Atrophic vaginitis  Essential hypertension, benign - well controlled   Restart prilosec OTC--daily for at least a week, until symptoms completely resolve, then as needed.  Ensure to avoid citrus/acidic/spicy foods, do not increase  your chocolate or caffeine intake.

## 2013-10-17 LAB — URINE CULTURE: Colony Count: 4000

## 2013-10-20 ENCOUNTER — Telehealth: Payer: Self-pay | Admitting: Family Medicine

## 2013-10-20 NOTE — Telephone Encounter (Signed)
Lajoyce Corners with [employer] [phone]) called concerning Ashley Vaughan Pt fell sometime Wednesday night/Thursday morning. Patient was not hurt, she has place on her head Ivin Booty would like order for patient to have PT due to falls  Please call

## 2013-10-20 NOTE — Telephone Encounter (Signed)
[place] for order for PT.  Please call.  Thanks

## 2013-10-20 NOTE — Telephone Encounter (Signed)
Gave verbal ok to do PT

## 2013-10-26 ENCOUNTER — Other Ambulatory Visit: Payer: Self-pay | Admitting: Family Medicine

## 2013-11-26 ENCOUNTER — Other Ambulatory Visit: Payer: Self-pay | Admitting: Family Medicine

## 2013-11-27 ENCOUNTER — Telehealth: Payer: Self-pay | Admitting: Family Medicine

## 2013-11-27 DIAGNOSIS — Z0000 Encounter for general adult medical examination without abnormal findings: Secondary | ICD-10-CM

## 2013-11-27 DIAGNOSIS — I10 Essential (primary) hypertension: Secondary | ICD-10-CM

## 2013-11-27 DIAGNOSIS — E7800 Pure hypercholesterolemia, unspecified: Secondary | ICD-10-CM

## 2013-11-27 DIAGNOSIS — M810 Age-related osteoporosis without current pathological fracture: Secondary | ICD-10-CM

## 2013-11-27 NOTE — Telephone Encounter (Signed)
Future orders were entered.  But she also has a lab visit scheduled for 6/25--this is just for CBC and B12. Please make sure that the visit is well documented (on nurse schedule) so that they only do CBC and B12, and do NOT release all of the September labs that I just entered--Thanks!!

## 2013-12-07 ENCOUNTER — Other Ambulatory Visit: Payer: Self-pay

## 2013-12-13 ENCOUNTER — Other Ambulatory Visit: Payer: Self-pay

## 2013-12-14 ENCOUNTER — Encounter: Payer: Self-pay | Admitting: Family Medicine

## 2013-12-14 ENCOUNTER — Other Ambulatory Visit: Payer: Medicare Other | Admitting: Family Medicine

## 2013-12-14 ENCOUNTER — Ambulatory Visit (INDEPENDENT_AMBULATORY_CARE_PROVIDER_SITE_OTHER): Payer: Medicare Other | Admitting: Family Medicine

## 2013-12-14 VITALS — BP 104/70 | HR 68 | Ht 62.0 in | Wt 103.0 lb

## 2013-12-14 DIAGNOSIS — E538 Deficiency of other specified B group vitamins: Secondary | ICD-10-CM

## 2013-12-14 DIAGNOSIS — B351 Tinea unguium: Secondary | ICD-10-CM

## 2013-12-14 LAB — CBC WITH DIFFERENTIAL/PLATELET
BASOS ABS: 0 10*3/uL (ref 0.0–0.1)
Basophils Relative: 0 % (ref 0–1)
EOS PCT: 2 % (ref 0–5)
Eosinophils Absolute: 0.1 10*3/uL (ref 0.0–0.7)
HEMATOCRIT: 33.7 % — AB (ref 36.0–46.0)
Hemoglobin: 11.3 g/dL — ABNORMAL LOW (ref 12.0–15.0)
Lymphocytes Relative: 20 % (ref 12–46)
Lymphs Abs: 1.3 10*3/uL (ref 0.7–4.0)
MCH: 31.6 pg (ref 26.0–34.0)
MCHC: 33.5 g/dL (ref 30.0–36.0)
MCV: 94.1 fL (ref 78.0–100.0)
MONO ABS: 0.8 10*3/uL (ref 0.1–1.0)
Monocytes Relative: 12 % (ref 3–12)
Neutro Abs: 4.3 10*3/uL (ref 1.7–7.7)
Neutrophils Relative %: 66 % (ref 43–77)
Platelets: 183 10*3/uL (ref 150–400)
RBC: 3.58 MIL/uL — ABNORMAL LOW (ref 3.87–5.11)
RDW: 14 % (ref 11.5–15.5)
WBC: 6.5 10*3/uL (ref 4.0–10.5)

## 2013-12-14 NOTE — Progress Notes (Signed)
   BP 104/70  Pulse 68  Ht 5\' 2"  (1.575 m)  Wt 103 lb (46.72 kg)  BMI 18.83 kg/m2  R great toenail--thickened Left great--irregular All polished   Main floor [place]

## 2013-12-14 NOTE — Progress Notes (Signed)
Chief Complaint  Patient presents with  . Nail Problem    great right toenail fungus.   Patient presents with complaint of pain in her right great toenail when she wears shoes.  The toenail is very thickened, and hurts with certain shoes.  She reports that she has had the nail taken off twice, but has regrown.  She tried to get them to file it/cut it when she had her recent pedicure, but they told her she needed to see a podiatrist.  She otherwise has been doing well.  She has been getting home health, and all her needs are being met.  She has some great days, and some days where she feels a little week.  She is also here today to get her routine CBC and B12 f/u.   PMH, PSH, SH reviewed.  Outpatient Encounter Prescriptions as of 12/14/2013  Medication Sig  . aspirin 81 MG tablet Take 81 mg by mouth daily.    Marland Kitchen atenolol (TENORMIN) 25 MG tablet TAKE 1/2 TABLET BY MOUTH DAILY  . Calcium Carbonate-Vitamin D (CALCIUM + D) 600-200 MG-UNIT TABS Take 1-2 tablets by mouth daily.    . Cyanocobalamin (B-12) 5000 MCG SUBL Place 1 tablet under the tongue daily.  Marland Kitchen LORazepam (ATIVAN) 0.5 MG tablet Take 0.5 mg by mouth. 1/2 tab am and 1 at night  . Multiple Vitamins-Minerals (MULTIVITAMIN WITH MINERALS) tablet Take 1 tablet by mouth daily.    . Multiple Vitamins-Minerals (OCUVITE PO) Take 2 tablets by mouth daily.    . Omega-3 Fatty Acids (FISH OIL) 1000 MG CAPS Take 1-2 capsules by mouth daily.    Marland Kitchen PREMARIN vaginal cream APPLY 1/2 GRAM VAGINALLY TWICE WEEKLY  . riluzole (RILUTEK) 50 MG tablet Take 50 mg by mouth every 12 (twelve) hours.  . simvastatin (ZOCOR) 20 MG tablet TAKE ONE HALF TABLET BY MOUTH AT BEDTIME   Allergies  Allergen Reactions  . Codeine Other (See Comments)    unknown  . Lexapro [Escitalopram] Nausea Only  . Macrobid [Nitrofurantoin Monohydrate Macrocrystals] Nausea Only    Nausea and headache  . Sulfa Antibiotics Other (See Comments)    dizziness   ROS:  No fevers, chills,  bleeding, bruising.  No significant change in weakness or new neuro complaints, no falls.  No URI symptoms, chest pain or other problems.  PHYSICAL EXAM: BP 104/70  Pulse 68  Ht _0  (1.575 m)  Wt 103 lb (46.72 kg)  BMI 18.83 kg/m2 Frail, elderly female in good spirits, in no distress. R great toenail is significantly thickened/raised; polished.  Left great toenail feels irregular. All nails are polished, so cannot report on discoloration, but others are not thickened. She has normal pulses, warm feet, normal sensation, and no lesions  ASSESSMENT/PLAN:  Onychomycosis of toenail - right great toe; painful when wearing shoes.  no ingrowing nail or infection  Reviewed s/sx of ingrown nail/paronychia and to notify us if they develop. Refer to [place].  Discussed soaking the nail and filing down (in interim, if can't get soon appointment).  Recommended wearing open toed shoes until then, to avoid pain  F/u as scheduled in September for CPE with fasting labs prior

## 2013-12-15 ENCOUNTER — Other Ambulatory Visit: Payer: Self-pay | Admitting: Family Medicine

## 2013-12-15 DIAGNOSIS — E538 Deficiency of other specified B group vitamins: Secondary | ICD-10-CM

## 2013-12-15 LAB — VITAMIN B12: Vitamin B-12: >2000 pg/mL — ABNORMAL HIGH (ref 211–911)

## 2013-12-27 ENCOUNTER — Other Ambulatory Visit: Payer: Self-pay | Admitting: Family Medicine

## 2014-01-02 ENCOUNTER — Ambulatory Visit (INDEPENDENT_AMBULATORY_CARE_PROVIDER_SITE_OTHER): Payer: Medicare Other | Admitting: Podiatry

## 2014-01-02 ENCOUNTER — Encounter: Payer: Self-pay | Admitting: Podiatry

## 2014-01-02 VITALS — BP 148/96 | HR 84 | Resp 16

## 2014-01-02 DIAGNOSIS — M79676 Pain in unspecified toe(s): Secondary | ICD-10-CM

## 2014-01-02 DIAGNOSIS — M79609 Pain in unspecified limb: Secondary | ICD-10-CM

## 2014-01-02 DIAGNOSIS — B351 Tinea unguium: Secondary | ICD-10-CM

## 2014-01-02 NOTE — Progress Notes (Signed)
   Subjective:    Patient ID: Ashley Vaughan, female    DOB: Oct 29, 1928, 80 y.o.   MRN: [id number]  HPI Comments: "I have this awful nail"  Patient c/o thick, discolored toenail, 1st right, for several months. She is unable to trim. She gets pedicures, but not helping. She has trouble wearing enclosed shoes.     Review of Systems  Constitutional: Positive for fatigue.  Musculoskeletal: Positive for gait problem.  Skin:       Change in nails  All other systems reviewed and are negative.      Objective:   Physical Exam: I have reviewed her past medical history medications allergies surgeries social history and review of systems. Pulses are strongly palpable bilateral. Neurologic sensorium is intact per Semmes-Weinstein monofilament. Deep tendon reflexes are intact bilateral muscle strength is 5 over 5 dorsiflexors plantar flexors inverters everters all intrinsic musculature is intact. Evaluation demonstrates all joints distal to the ankle a full range of motion without crepitation. Cutaneous evaluation demonstrates supple while hydrated cutis with exception of nails appear to be thick yellow dystrophic and clinically mycotic.        Assessment & Plan:  Pain in limb secondary to onychomycosis 1 through 5 bilateral.  Plan: Debridement of nails 1 through 5 bilateral covered service secondary to pain followup with her as needed.

## 2014-01-24 ENCOUNTER — Other Ambulatory Visit: Payer: Self-pay | Admitting: Family Medicine

## 2014-01-29 ENCOUNTER — Telehealth: Payer: Self-pay | Admitting: Family Medicine

## 2014-01-29 ENCOUNTER — Encounter: Payer: Self-pay | Admitting: *Deleted

## 2014-01-29 NOTE — Telephone Encounter (Signed)
Letter typed, printed and mailed to patient per her request.

## 2014-01-29 NOTE — Telephone Encounter (Signed)
Pt called and stated that she has recently gotten a med alert system. She states if she receives a letter of medical necessity she can receive discount on rent. Please advise pt and provide letter if appropriate. Pt can be reached at [phone].

## 2014-01-29 NOTE — Telephone Encounter (Signed)
[place] for [employer].  Elderly female with ALS and other chronic medical problems, who lives alone

## 2014-02-26 ENCOUNTER — Other Ambulatory Visit: Payer: Self-pay | Admitting: Family Medicine

## 2014-02-26 NOTE — Telephone Encounter (Signed)
IS THIS OKAY 

## 2014-02-26 NOTE — Telephone Encounter (Signed)
She has appt for labs and OV 9/22 and 9/24.  We would not want her running out prior to her visit, so it should be refilled (without add'l refill).  I did this, but this is something you can look at and do. If it were just a couple of days before the appt, I would check with pt to see if she had enough to last until visit and refill would be given at visit, but this is far enough away that she likely needs it filled.

## 2014-03-06 ENCOUNTER — Other Ambulatory Visit: Payer: Medicare Other

## 2014-03-06 DIAGNOSIS — E538 Deficiency of other specified B group vitamins: Secondary | ICD-10-CM

## 2014-03-06 DIAGNOSIS — I10 Essential (primary) hypertension: Secondary | ICD-10-CM

## 2014-03-06 DIAGNOSIS — E7800 Pure hypercholesterolemia, unspecified: Secondary | ICD-10-CM

## 2014-03-06 DIAGNOSIS — Z0000 Encounter for general adult medical examination without abnormal findings: Secondary | ICD-10-CM

## 2014-03-06 LAB — CBC WITH DIFFERENTIAL/PLATELET
BASOS ABS: 0 10*3/uL (ref 0.0–0.1)
Basophils Relative: 0 % (ref 0–1)
Eosinophils Absolute: 0.1 10*3/uL (ref 0.0–0.7)
Eosinophils Relative: 2 % (ref 0–5)
HCT: 34.8 % — ABNORMAL LOW (ref 36.0–46.0)
HEMOGLOBIN: 11.7 g/dL — AB (ref 12.0–15.0)
LYMPHS PCT: 21 % (ref 12–46)
Lymphs Abs: 1.5 10*3/uL (ref 0.7–4.0)
MCH: 31.2 pg (ref 26.0–34.0)
MCHC: 33.6 g/dL (ref 30.0–36.0)
MCV: 92.8 fL (ref 78.0–100.0)
MONOS PCT: 11 % (ref 3–12)
Monocytes Absolute: 0.8 10*3/uL (ref 0.1–1.0)
NEUTROS ABS: 4.6 10*3/uL (ref 1.7–7.7)
Neutrophils Relative %: 66 % (ref 43–77)
Platelets: 184 10*3/uL (ref 150–400)
RBC: 3.75 MIL/uL — ABNORMAL LOW (ref 3.87–5.11)
RDW: 13.9 % (ref 11.5–15.5)
WBC: 7 10*3/uL (ref 4.0–10.5)

## 2014-03-06 LAB — TSH: TSH: 2.707 u[IU]/mL (ref 0.350–4.500)

## 2014-03-07 LAB — COMPREHENSIVE METABOLIC PANEL
ALT: 10 U/L (ref 0–35)
AST: 15 U/L (ref 0–37)
Albumin: 4.2 g/dL (ref 3.5–5.2)
Alkaline Phosphatase: 47 U/L (ref 39–117)
BUN: 12 mg/dL (ref 6–23)
CALCIUM: 9.4 mg/dL (ref 8.4–10.5)
CHLORIDE: 105 meq/L (ref 96–112)
CO2: 27 meq/L (ref 19–32)
CREATININE: 0.64 mg/dL (ref 0.50–1.10)
Glucose, Bld: 92 mg/dL (ref 70–99)
POTASSIUM: 4.1 meq/L (ref 3.5–5.3)
Sodium: 141 mEq/L (ref 135–145)
Total Bilirubin: 0.6 mg/dL (ref 0.2–1.2)
Total Protein: 6.4 g/dL (ref 6.0–8.3)

## 2014-03-07 LAB — LIPID PANEL
Cholesterol: 148 mg/dL (ref 0–200)
HDL: 66 mg/dL (ref >39)
LDL Cholesterol: 67 mg/dL (ref 0–99)
Total CHOL/HDL Ratio: 2.2 Ratio
Triglycerides: 77 mg/dL (ref <150)
VLDL: 15 mg/dL (ref 0–40)

## 2014-03-08 ENCOUNTER — Telehealth: Payer: Self-pay | Admitting: *Deleted

## 2014-03-08 ENCOUNTER — Encounter: Payer: Self-pay | Admitting: Family Medicine

## 2014-03-08 ENCOUNTER — Ambulatory Visit (INDEPENDENT_AMBULATORY_CARE_PROVIDER_SITE_OTHER): Payer: Medicare Other | Admitting: Family Medicine

## 2014-03-08 VITALS — BP 170/72 | HR 56 | Ht 62.0 in | Wt 105.0 lb

## 2014-03-08 DIAGNOSIS — I498 Other specified cardiac arrhythmias: Secondary | ICD-10-CM

## 2014-03-08 DIAGNOSIS — Z0000 Encounter for general adult medical examination without abnormal findings: Secondary | ICD-10-CM

## 2014-03-08 DIAGNOSIS — E7800 Pure hypercholesterolemia, unspecified: Secondary | ICD-10-CM

## 2014-03-08 DIAGNOSIS — R001 Bradycardia, unspecified: Secondary | ICD-10-CM

## 2014-03-08 DIAGNOSIS — I10 Essential (primary) hypertension: Secondary | ICD-10-CM

## 2014-03-08 DIAGNOSIS — M810 Age-related osteoporosis without current pathological fracture: Secondary | ICD-10-CM

## 2014-03-08 DIAGNOSIS — G1221 Amyotrophic lateral sclerosis: Secondary | ICD-10-CM

## 2014-03-08 LAB — POCT URINALYSIS DIPSTICK
Bilirubin, UA: NEGATIVE
Blood, UA: NEGATIVE
Glucose, UA: NEGATIVE
KETONES UA: NEGATIVE
Nitrite, UA: NEGATIVE
Protein, UA: NEGATIVE
Spec Grav, UA: 1.015
UROBILINOGEN UA: NEGATIVE
pH, UA: 6

## 2014-03-08 NOTE — Telephone Encounter (Signed)
Patient informed. 

## 2014-03-08 NOTE — Telephone Encounter (Signed)
NP @ cardio office looked at EKG and compared to one from last week (both doctors were out of the office). She is the exact same as the one they just did.

## 2014-03-08 NOTE — Progress Notes (Signed)
Chief Complaint  Patient presents with  . Annual Exam    nonfasting annual exam, labs already done. UA showed trace leuks, slight burning with urination. Does not want flu vaccine today. Did have recent bp med change by Dr. Lou Miner. Did not do eye exam as she just had one.    Ashley Vaughan is a 80 y.o. female who presents for a complete physical.  She has the following concerns:  She is complaining of a slight pain between her breasts, "like indigestion", sometimes radiates to the back.  She is frequently nauseated, doesn't vomit.  She isn't "having a good day"--doesn't feel very good, slightly weak.  Doesn't want to get her flu shot today, but will get it this season, just not today. ALS:  She has gotten weaker over the last few months, and hands are even harder to move, more She sometimes wakes up feeling great, feels bad by the afternoon, and vice versa on other days.  Every day is different, sometimes feeling strong, sometimes not. Today she feels weak.  Recently seen with painful toenails/onychomycosis.  She since saw podiatrist; nails were trimmed, and they are no longer painful. GERD:  Hasn't need to use any PPI in quite a while (seen with flare back in May) Atrophic vaginitis--she has some intermittent burning and itching.  She uses topical estrogen once or twice a week, as needed.  Hypertension:  She was tapered off of atenolol by neurologist, and hasn't had any recurrent migraines.  Amlodipine was stopped in 01/2013 for postural hypotension, but then was added back at 2.5mg , and dose was just titrated up to 5mg  dose.  She took the first dose of 5mg  today. BP's at home have ranged from 128-168/55-87 over the last 1-2 weeks (she brought in her values). Pulse is 41-58, mostly in the mid 40's.   She has seen a nutritionist at [facility], and was told to eat more hot dogs, eggs, whole milk, butter--she is concerned about this. She has lost 15 pound over the last 1.5-2 years, but no recent  loss.  Wt Readings from Last 3 Encounters:  03/08/14 105 lb (47.628 kg)  12/14/13 103 lb (46.72 kg)  12/14/13 103 lb (46.72 kg)   Immunization History  Administered Date(s) Administered  . Influenza, High Dose Seasonal PF 06/01/2013  . Pneumococcal Polysaccharide-23 06/15/2000, 12/04/2010  . Tdap 09/30/2006  . Zoster 01/27/2011   Last Pap smear: at least 5 years ago  Last mammogram: 01/2010  Last colonoscopy: 9/09 Dr. Collene Mares  Last DEXA: 12/19/10 at [facility] T-2.9 in spine, with statistically significant loss in R hip compared to 11/2008  Ophtho: 12/2013  Dentist: about a year ago. Exercise: No longer able to walk (used to walk 30 min daily)--She gets PT twice a week, walks with walker.   Past Medical History  Diagnosis Date  . Hypertension   . Osteoporosis   . Migraine (atenolol for prophylaxis)  . Macular degeneration   . Kidney stones, calcium oxalate 1993  . Pulmonary hypertension mild,mild AR,mild-mod MR,mild-mod TR  . Hematuria, microscopic DrKimbrough 1993  . Hiatal hernia   . Diverticulosis (L sided) 1994  . Hyperlipidemia   . BCC (basal cell carcinoma of skin) L cheek-DrLupton (6/09)  . Postmenopausal bleeding 2010-DrCole  . Allergy     s/p immunotherapy  . Mitral regurgitation     mild-mod; mild aortic regurgitation, and mild-mod TR  . Atrophic vaginitis   . Hearing loss     right ear  . B12 deficiency   .  ALS (amyotrophic lateral sclerosis)     Past Surgical History  Procedure Laterality Date  . Hemorroidectomy  1953  . Tonsillectomy and adenoidectomy  age 7    History   Social History  . Marital Status: Single    Spouse Name: N/A    Number of Children: 1  . Years of Education: 12   Occupational History  . 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 Smoking status: Former Smoker    Quit date: 06/15/1978  . Smokeless tobacco: Never Used  . Alcohol Use: No  . Drug Use: No  . Sexual Activity: Not on file   Other Topics Concern  .  Not on file   Social History Narrative   Lives alone, in [place].  Son and his family live in [place].    Patient has high school education.   Caffeine- half cup in the morning.   Right handed.   She has 1 great grandson, and is expecting another soon (07/2014)    Family History  Problem Relation Age of Onset  . Cirrhosis Mother   . Cancer Father     ? they took out rotten intestines  . Cancer Brother     prostate  . Stroke Brother   . Stroke Brother 74  . Stroke Sister 15  . Breast cancer Sister 2    breast cancer  . Cancer Sister 51    breast cancer  . Heart disease Neg Hx   . Diabetes Neg Hx    Outpatient Encounter Prescriptions as of 03/08/2014  Medication Sig Note  . amLODipine (NORVASC) 5 MG tablet Take 5 mg by mouth daily.  03/08/2014: Dose increased from 2.5mg , first 5mg  dose was today  . aspirin 81 MG tablet Take 81 mg by mouth daily.     . Cyanocobalamin (B-12) 5000 MCG SUBL Place 1 tablet under the tongue daily.   Marland Kitchen LORazepam (ATIVAN) 0.5 MG tablet Take 0.5 mg by mouth. 1/2 tab am and 1 at night 03/08/2014: Taking 1 tablet at bedtime.  Hasn't needed daytime dose in months  . Multiple Vitamins-Minerals (MULTIVITAMIN WITH MINERALS) tablet Take 1 tablet by mouth daily.     . Multiple Vitamins-Minerals (OCUVITE PO) Take 2 tablets by mouth daily.     Marland Kitchen PREMARIN vaginal cream APPLY 1/2 GRAM VAGINALLY TWICE WEEKLY 03/08/2014: Uses once or twice a week, as needed  . riluzole (RILUTEK) 50 MG tablet Take 50 mg by mouth every 12 (twelve) hours.   . simvastatin (ZOCOR) 20 MG tablet TAKE ONE HALF TABLET BY MOUTH AT BEDTIME   . Calcium Carbonate-Vitamin D (CALCIUM + D) 600-200 MG-UNIT TABS Take 1-2 tablets by mouth daily.   03/08/2014: Has not been taking  . Omega-3 Fatty Acids (FISH OIL) 1000 MG CAPS Take 1-2 capsules by mouth daily.   03/08/2014: She takes it occasionally  . [DISCONTINUED] atenolol (TENORMIN) 25 MG tablet TAKE 1/2 TABLET BY MOUTH DAILY      Allergies   Allergen Reactions  . Codeine Other (See Comments)    unknown  . Lexapro [Escitalopram] Nausea Only  . Macrobid [Nitrofurantoin Monohydrate Macrocrystals] Nausea Only    Nausea and headache  . Sulfa Antibiotics Other (See Comments)    dizziness   The patient denies anorexia, fever,weight changes, headaches, ear pain, sore throat, breast concerns, palpitations, dizziness, syncope, dyspnea on exertion, cough, swelling, vomiting, diarrhea, abdominal pain, melena, hematochezia, hematuria, incontinence, dysuria, no postmenopausal bleeding, vaginal discharge, odor or itch, genital lesions,  joint pains, numbness, tingling, tremor, suspicious skin lesions, depression, anxiety, abnormal bleeding/bruising, or enlarged lymph nodes.  + constipation occasionally--better since using miralax q3 days, and metamucil the other days +chronic hearing loss R ear. Constant runny nose (and it is hard to wipe!). Moods overall are good--only feels depressed when she struggles with ADL's (like getting dressed). +fatigue today   PHYSICAL EXAM:  BP 170/72  Pulse 56  Ht 5\' 2"  (1.575 m)  Wt 105 lb (47.628 kg)  BMI 19.20 kg/m2  General Appearance:  Alert, cooperative, no distress, appears stated age, elderly, thin  Head:  Normocephalic, without obvious abnormality, atraumatic   Eyes:  PERRL, EOMI. Conjunctiva clear. Fundi not visualized (miotic pupils)  Ears:  Normal TM's and external ear canals   Nose:  Nares normal, mucosa normal, no drainage or sinus tenderness   Throat:  Lips, mucosa, and tongue normal; dentures   Neck:  Supple, no lymphadenopathy; thyroid: no enlargement/tenderness/nodules; no carotid  bruit or JVD   Back:  Spine nontender, no curvature, ROM normal, no CVA tenderness   Lungs:  Clear to auscultation bilaterally without wheezes, rales or ronchi; respirations unlabored   Chest Wall:  No tenderness or deformity   Heart:  Regular rate and rhythm, S1 and S2 normal, no murmur, rub  or gallop.  Occasional ectopy   Breast Exam:  No tenderness, masses, or nipple discharge or inversion. No axillary lymphadenopathy   Abdomen:  Soft, non-tender, nondistended, normoactive bowel sounds,  no masses, no hepatosplenomegaly   Genitalia:  Normal external genitalia without lesions. Atrophic changes have improved since last exam. BUS normal. Bimanual exam revealed no tenderness or masses. Uterus normal size, adnexa not palpable. Pap not performed due to age   Rectal:  Normal tone, no masses or tenderness; guaiac negative stool   Extremities:  No clubbing, cyanosis or edema   Pulses:  2+ and symmetric all extremities.Thickened, onychomycotic toenails, filed down, nontender  Skin:  Skin color, texture, turgor normal, no rashes or lesions   Lymph nodes:  Cervical, supraclavicular, and axillary nodes normal   Neurologic:  CNII-XII intact, normal sensation; walks with walker, slow.  Hands are weak, with significant contractures and unable to straight fingers        Psych: Normal mood, affect, hygiene and grooming.   EKG--compared to the one from April--no longer has TWI in V3, and V6 also now looks more appropriate. No other changes noted. Sinus brady (rate 44) with PAC's, incomplete RBBB.  Her EKG was faxed over to cardiologist office--as she had EKG last week (when not having any chest pain.  EKG's were compared at their office, and reportedly without change.  Lab Results  Component Value Date   CHOL 148 03/06/2014   HDL 66 03/06/2014   LDLCALC 67 03/06/2014   TRIG 77 03/06/2014   CHOLHDL 2.2 03/06/2014   Lab Results  Component Value Date   TSH 2.707 03/06/2014     Chemistry      Component Value Date/Time   NA 141 03/06/2014 0854   K 4.1 03/06/2014 0854   CL 105 03/06/2014 0854   CO2 27 03/06/2014 0854   BUN 12 03/06/2014 0854   CREATININE 0.64 03/06/2014 0854      Component Value Date/Time   CALCIUM 9.4 03/06/2014 0854   ALKPHOS 47 03/06/2014 0854   AST 15 03/06/2014 0854   ALT 10 03/06/2014 0854    BILITOT 0.6 03/06/2014 0854     Glucose 92  Lab Results  Component Value Date  WBC 7.0 03/06/2014   HGB 11.7* 03/06/2014   HCT 34.8* 03/06/2014   MCV 92.8 03/06/2014   PLT 184 03/06/2014   ASSESSMENT/PLAN:  Routine general medical examination at a health care facility - Plan: POCT Urinalysis Dipstick  Essential hypertension, benign - suboptimally controlled.  amlodipine dose was just increased - Plan: EKG 12-Lead  Pure hypercholesterolemia - at goal  Osteoporosis - meds had been recommended, declined.  discussed calcium, Vit D, weight bearing exercise, risks/consequences of untreated osteoporosis  ALS (amyotrophic lateral sclerosis) - stable overall  Sinus bradycardia - persists, despite being weaned off beta blocker.  Not symptomatic at this point  Osteoporosis--Reviewed previous DEXA results and discussed risks of untreated osteoporosis.  Declines any treatment at this time, and is not interested in repeating the DEXA study (but might consider this).  Discussed importance of calcium, vitamin D and regular weight-bearing exercise (including upper extremities)   Has medical power of attorney papers at home.   Discussed monthly self breast exams and yearly mammograms; at least 30 minutes of aerobic activity at least 5 days/week, weight-bearing exercise 2x/wk; proper sunscreen use reviewed; healthy diet, including goals of calcium and vitamin D intake and alcohol recommendations (less than or equal to 1 drink/day) reviewed; regular seatbelt use; changing batteries in smoke detectors. Immunization recommendations discussed--yearly flu shots recommended (high dose), along with Prevnar-13 today.  Declines today--will return for [place] for these vaccines. Colonoscopy recommendations reviewed  We discussed diet recommendations from [facility]--to help gain weight. Reassured--lipids great; she is used to eating "healthy" and was very surprised of diet they recommended.  Her lipids are so well  controlled, that she could introduce some more caloric and fattier foods in diet, and she should still be fine--reassured.   Declines flu shot today--will get another day. HCWCBJS-28 recommended--declines today.   F/u [place] for vaccines.

## 2014-03-08 NOTE — Telephone Encounter (Signed)
Noted--please let pt know, to reassure her.

## 2014-03-08 NOTE — Patient Instructions (Addendum)
  HEALTH MAINTENANCE RECOMMENDATIONS:  It is recommended that you get at least 30 minutes of aerobic exercise at least 5 days/week (for weight loss, you may need as much as 60-90 minutes). This can be any activity that gets your heart rate up. This can be divided in 10-15 minute intervals if needed, but try and build up your endurance at least once a week.  Weight bearing exercise is also recommended twice weekly.  Eat a healthy diet with lots of vegetables, fruits and fiber.  "Colorful" foods have a lot of vitamins (ie green vegetables, tomatoes, red peppers, etc).  Limit sweet tea, regular sodas and alcoholic beverages, all of which has a lot of calories and sugar.  Up to 1 alcoholic drink daily may be beneficial for women (unless trying to lose weight, watch sugars).  Drink a lot of water.  Calcium recommendations are 1200-1500 mg daily (1500 mg for postmenopausal women or women without ovaries), and vitamin D 1000 IU daily.  This should be obtained from diet and/or supplements (vitamins), and calcium should not be taken all at once, but in divided doses.  Monthly self breast exams and yearly mammograms for women over the age of 57 is recommended.  Sunscreen of at least SPF 30 should be used on all sun-exposed parts of the skin when outside between the hours of 10 am and 4 pm (not just when at beach or pool, but even with exercise, golf, tennis, and yard work!)  Use a sunscreen that says "broad spectrum" so it covers both UVA and UVB rays, and make sure to reapply every 1-2 hours.  Remember to change the batteries in your smoke detectors when changing your clock times in the spring and fall.  Use your seat belt every time you are in a car, and please drive safely and not be distracted with cell phones and texting while driving.   Please consider getting another bone density test (at [facility]) to recheck your osteoporosis.  Treatment to strengthen your bones has been recommended (you declined), and  at this point it might be worth looking to see if there has been significant decline since the last test (2012).  You can call [facility] to schedule if you change your mind and would like to have it done (they will need to call/fax our office to get the order)  High dose flu shot and Prevnar-13 (a different type of pneumonia shot) are both recommended.  Please return (nurse visit is fine) to get these when you are feeling better.

## 2014-03-14 ENCOUNTER — Encounter: Payer: Self-pay | Admitting: Family Medicine

## 2014-03-28 ENCOUNTER — Telehealth: Payer: Self-pay | Admitting: Family Medicine

## 2014-03-28 MED ORDER — SIMVASTATIN 20 MG PO TABS: ORAL_TABLET | ORAL | Status: DC

## 2014-03-28 NOTE — Telephone Encounter (Signed)
Pt called and stated that she needs refills on zocor sent to [facility]

## 2014-03-28 NOTE — Telephone Encounter (Signed)
Done

## 2014-04-03 ENCOUNTER — Encounter: Payer: Self-pay | Admitting: Podiatry

## 2014-04-03 ENCOUNTER — Encounter (INDEPENDENT_AMBULATORY_CARE_PROVIDER_SITE_OTHER): Payer: Medicare Other | Admitting: Podiatry

## 2014-04-03 DIAGNOSIS — M79676 Pain in unspecified toe(s): Secondary | ICD-10-CM

## 2014-04-03 DIAGNOSIS — B351 Tinea unguium: Secondary | ICD-10-CM

## 2014-04-03 NOTE — Progress Notes (Signed)
   Subjective:    Patient ID: Ashley Vaughan, female    DOB: [birth year], 80 y.o.   MRN: [id number]  HPI Comments: Presents today chief complaint of painful elongated toenails.  Objective: Pulses are palpable bilateral nails are thick, yellow dystrophic onychomycosis and painful palpation.   Assessment: Onychomycosis with pain in limb.  Plan: Treatment of nails in thickness and length as covered service secondary to pain.      Review of Systems     Objective:   Physical Exam        Assessment & Plan:   This encounter was created in error - please disregard.

## 2014-04-12 ENCOUNTER — Telehealth: Payer: Self-pay | Admitting: Family Medicine

## 2014-04-12 ENCOUNTER — Telehealth: Payer: Self-pay | Admitting: *Deleted

## 2014-04-12 NOTE — Telephone Encounter (Signed)
Ok to continue

## 2014-04-12 NOTE — Telephone Encounter (Signed)
Daughter in law, Judeen Hammans, requesting that an order be sent to [facility] to continue home health services. Judeen Hammans would like a phone call to know when this has been done

## 2014-04-12 NOTE — Telephone Encounter (Signed)
noted 

## 2014-04-12 NOTE — Telephone Encounter (Signed)
Done and DIL notified.

## 2014-04-12 NOTE — Telephone Encounter (Signed)
Called over to get fax number and was told that there was no reason to fax an order as Ashley Vaughan had received 6 months of OT,PT and HHA and was at goal and they could not continue services. They did tell me that if she was to have an "episode" or a hospital stay that they could begin again after this. I did call DIL, Judeen Hammans and let her know. She is going to speak to her husband tonight and figure out where they want to go from here. Just an FYI.

## 2014-04-16 ENCOUNTER — Encounter: Payer: Self-pay | Admitting: Podiatry

## 2014-05-03 ENCOUNTER — Ambulatory Visit (INDEPENDENT_AMBULATORY_CARE_PROVIDER_SITE_OTHER): Payer: Medicare Other | Admitting: Podiatry

## 2014-05-03 DIAGNOSIS — M79673 Pain in unspecified foot: Secondary | ICD-10-CM

## 2014-05-03 DIAGNOSIS — B351 Tinea unguium: Secondary | ICD-10-CM

## 2014-05-03 NOTE — Progress Notes (Signed)
She presents today with chief complaint of painful elongated toenails 1 through 5 bilateral.  Objective: Nails are thick yellow dystrophic mycotic bilateral.  Assessment: Patient and limb secondary to onychomycosis 1 through 5 bilateral.  Plan: Debridement of nails 1 through 5 bilateral cover service pain. 

## 2014-05-11 ENCOUNTER — Encounter (HOSPITAL_COMMUNITY): Payer: Self-pay | Admitting: *Deleted

## 2014-05-11 ENCOUNTER — Emergency Department (HOSPITAL_COMMUNITY)
Admission: EM | Admit: 2014-05-11 | Discharge: 2014-05-11 | Disposition: A | Discharge status: Home / Self Care | Payer: Medicare Other | Attending: Emergency Medicine | Admitting: Emergency Medicine

## 2014-05-11 ENCOUNTER — Emergency Department (HOSPITAL_COMMUNITY): Payer: Medicare Other

## 2014-05-11 DIAGNOSIS — Z8719 Personal history of other diseases of the digestive system: Secondary | ICD-10-CM | POA: Insufficient documentation

## 2014-05-11 DIAGNOSIS — H9191 Unspecified hearing loss, right ear: Secondary | ICD-10-CM | POA: Diagnosis not present

## 2014-05-11 DIAGNOSIS — Z7982 Long term (current) use of aspirin: Secondary | ICD-10-CM | POA: Insufficient documentation

## 2014-05-11 DIAGNOSIS — Y998 Other external cause status: Secondary | ICD-10-CM | POA: Insufficient documentation

## 2014-05-11 DIAGNOSIS — Y9389 Activity, other specified: Secondary | ICD-10-CM | POA: Diagnosis not present

## 2014-05-11 DIAGNOSIS — W19XXXA Unspecified fall, initial encounter: Secondary | ICD-10-CM

## 2014-05-11 DIAGNOSIS — S0083XA Contusion of other part of head, initial encounter: Secondary | ICD-10-CM | POA: Insufficient documentation

## 2014-05-11 DIAGNOSIS — T148XXA Other injury of unspecified body region, initial encounter: Secondary | ICD-10-CM

## 2014-05-11 DIAGNOSIS — Z79899 Other long term (current) drug therapy: Secondary | ICD-10-CM | POA: Diagnosis not present

## 2014-05-11 DIAGNOSIS — I10 Essential (primary) hypertension: Secondary | ICD-10-CM | POA: Insufficient documentation

## 2014-05-11 DIAGNOSIS — G43909 Migraine, unspecified, not intractable, without status migrainosus: Secondary | ICD-10-CM | POA: Insufficient documentation

## 2014-05-11 DIAGNOSIS — S0990XA Unspecified injury of head, initial encounter: Secondary | ICD-10-CM | POA: Diagnosis present

## 2014-05-11 DIAGNOSIS — E785 Hyperlipidemia, unspecified: Secondary | ICD-10-CM | POA: Diagnosis not present

## 2014-05-11 DIAGNOSIS — Z87442 Personal history of urinary calculi: Secondary | ICD-10-CM | POA: Insufficient documentation

## 2014-05-11 DIAGNOSIS — Z87891 Personal history of nicotine dependence: Secondary | ICD-10-CM | POA: Insufficient documentation

## 2014-05-11 DIAGNOSIS — Y929 Unspecified place or not applicable: Secondary | ICD-10-CM | POA: Insufficient documentation

## 2014-05-11 DIAGNOSIS — W1849XA Other slipping, tripping and stumbling without falling, initial encounter: Secondary | ICD-10-CM | POA: Insufficient documentation

## 2014-05-11 NOTE — Discharge Instructions (Signed)
We saw you in the ER after you had a fall. °All the imaging results are normal, no fractures seen. No evidence of brain bleed. °Please be very careful with walking, and do everything possible to prevent falls. ° ° °Contusion °A contusion is a deep bruise. Contusions are the result of an injury that caused bleeding under the skin. The contusion may turn blue, purple, or yellow. Minor injuries will give you a painless contusion, but more severe contusions may stay painful and swollen for a few weeks.  °CAUSES  °A contusion is usually caused by a blow, trauma, or direct force to an area of the body. °SYMPTOMS  °· Swelling and redness of the injured area. °· Bruising of the injured area. °· Tenderness and soreness of the injured area. °· Pain. °DIAGNOSIS  °The diagnosis can be made by taking a history and physical exam. An X-ray, CT scan, or MRI may be needed to determine if there were any associated injuries, such as fractures. °TREATMENT  °Specific treatment will depend on what area of the body was injured. In general, the best treatment for a contusion is resting, icing, elevating, and applying cold compresses to the injured area. Over-the-counter medicines may also be recommended for pain control. Ask your caregiver what the best treatment is for your contusion. °HOME CARE INSTRUCTIONS  °· Put ice on the injured area. °¨ Put ice in a plastic bag. °¨ Place a towel between your skin and the bag. °¨ Leave the ice on for 15-20 minutes, 3-4 times a day, or as directed by your health care provider. °· Only take over-the-counter or prescription medicines for pain, discomfort, or fever as directed by your caregiver. Your caregiver may recommend avoiding anti-inflammatory medicines (aspirin, ibuprofen, and naproxen) for 48 hours because these medicines may increase bruising. °· Rest the injured area. °· If possible, elevate the injured area to reduce swelling. °SEEK IMMEDIATE MEDICAL CARE IF:  °· You have increased bruising  or swelling. °· You have pain that is getting worse. °· Your swelling or pain is not relieved with medicines. °MAKE SURE YOU:  °· Understand these instructions. °· Will watch your condition. °· Will get help right away if you are not doing well or get worse. °Document Released: 03/11/2005 Document Revised: 06/06/2013 Document Reviewed: 04/06/2011 °[employer]® Patient Information ©2015 [employer]. This information is not intended to replace advice given to you by your health care provider. Make sure you discuss any questions you have with your health care provider. ° °

## 2014-05-11 NOTE — ED Notes (Signed)
Pt reports having ALS and fell backwards today while getting mail.  Pt fell on back of head and has small amount of bruising and some swelling in area.  Pt alert and oriented.  Pt has h/a.Pt denies losing consciousness.

## 2014-05-11 NOTE — ED Notes (Signed)
Pt returned from Ct.

## 2014-05-11 NOTE — ED Notes (Signed)
Pt made aware to return if symptoms worsen or if any life threatening symptoms occur.   

## 2014-05-11 NOTE — ED Notes (Signed)
Pt in c/o fall, states she tripped trying to open her mailbox and fell backwards and hit her head, denies LOC, c/o mild headache

## 2014-05-11 NOTE — ED Provider Notes (Signed)
CSN: [id number]     Arrival date & time 05/11/14  1413 History   First MD Initiated Contact with Patient 05/11/14 1555     Chief Complaint  Patient presents with  . Fall     (Consider location/radiation/quality/duration/timing/severity/associated sxs/prior Treatment) HPI Comments: Pt comes in post mechanical fall. Pt tripped whilst opening her mailbox, fell backwards, hit her head. She was able to get up. Floor is wooden. + headache and swelling. No other complains.   The history is provided by the patient.    Past Medical History  Diagnosis Date  . Hypertension   . Osteoporosis   . Migraine (atenolol for prophylaxis)  . Macular degeneration   . Kidney stones, calcium oxalate 1993  . Pulmonary hypertension mild,mild AR,mild-mod MR,mild-mod TR  . Hematuria, microscopic DrKimbrough 1993  . Hiatal hernia   . Diverticulosis (L sided) 1994  . Hyperlipidemia   . BCC (basal cell carcinoma of skin) L cheek-DrLupton (6/09)  . Postmenopausal bleeding 2010-DrCole  . Allergy     s/p immunotherapy  . Mitral regurgitation     mild-mod; mild aortic regurgitation, and mild-mod TR  . Atrophic vaginitis   . Hearing loss     right ear  . B12 deficiency   . ALS (amyotrophic lateral sclerosis)    Past Surgical History  Procedure Laterality Date  . Hemorroidectomy  1953  . Tonsillectomy and adenoidectomy  age 37   Family History  Problem Relation Age of Onset  . Cirrhosis Mother   . Cancer Father     ? they took out rotten intestines  . Cancer Brother     prostate  . Stroke Brother   . Stroke Brother 34  . Stroke Sister 57  . Breast cancer Sister 73    breast cancer  . Cancer Sister 56    breast cancer  . Heart disease Neg Hx   . Diabetes Neg Hx    History  Substance Use Topics  . Smoking status: Former Smoker    Quit date: 06/15/1978  . Smokeless tobacco: Never Used  . Alcohol Use: No   OB History    Gravida Para Term Preterm AB TAB SAB Ectopic Multiple Living   1 1         1      Review of Systems  Constitutional: Negative for activity change.  Respiratory: Negative for shortness of breath.   Cardiovascular: Negative for chest pain.  Gastrointestinal: Negative for nausea, vomiting and abdominal pain.  Genitourinary: Negative for dysuria.  Musculoskeletal: Negative for neck pain.  Neurological: Positive for headaches.      Allergies  Codeine; Lexapro; Macrobid; and Sulfa antibiotics  Home Medications   Prior to Admission medications   Medication Sig Start Date End Date Taking? Authorizing Provider  amLODipine (NORVASC) 5 MG tablet Take 5 mg by mouth daily.  03/07/14  Yes Historical Provider, MD  aspirin 81 MG tablet Take 81 mg by mouth daily.     Yes Historical Provider, MD  Cyanocobalamin (B-12) 5000 MCG SUBL Place 1 tablet under the tongue daily.   Yes Historical Provider, MD  LORazepam (ATIVAN) 0.5 MG tablet Take 0.5 mg by mouth. 1/2 tab am and 1 at night 07/21/13  Yes Historical Provider, MD  Multiple Vitamins-Minerals (MULTIVITAMIN WITH MINERALS) tablet Take 1 tablet by mouth daily.     Yes Historical Provider, MD  Multiple Vitamins-Minerals (OCUVITE PO) Take 2 tablets by mouth daily.     Yes Historical Provider, MD  PREMARIN vaginal cream APPLY  1/2 GRAM VAGINALLY TWICE WEEKLY 10/26/13  Yes Rita Ohara, MD  riluzole (RILUTEK) 50 MG tablet Take 50 mg by mouth every 12 (twelve) hours.   Yes Historical Provider, MD  simvastatin (ZOCOR) 20 MG tablet TAKE ONE HALF TABLET BY MOUTH AT BEDTIME 03/28/14  Yes Rita Ohara, MD   BP 148/87 mmHg  Pulse 94  Temp(Src) 97.7 F (36.5 C) (Oral)  Resp 16  SpO2 97% Physical Exam  Constitutional: She is oriented to person, place, and time. She appears well-developed and well-nourished.  HENT:  Head: Normocephalic and atraumatic.  Eyes: EOM are normal. Pupils are equal, round, and reactive to light.  Neck: Neck supple.  Cardiovascular: Normal rate, regular rhythm and normal heart sounds.   No murmur  heard. Pulmonary/Chest: Effort normal. No respiratory distress.  Abdominal: Soft. She exhibits no distension. There is no tenderness. There is no rebound and no guarding.  Musculoskeletal: She exhibits no edema.  Pt has posterior scalp hematoma. OTHERWISE  Head to toe evaluation shows no hematoma, bleeding of the scalp, no facial abrasions, step offs, crepitus, no tenderness to palpation of the bilateral upper and lower extremities, no gross deformities, no chest tenderness, no pelvic pain.   Neurological: She is alert and oriented to person, place, and time.  Skin: Skin is warm and dry.  Nursing note and vitals reviewed.   ED Course  Procedures (including critical care time) Labs Review Labs Reviewed - No data to display  Imaging Review Ct Head Wo Contrast  05/11/2014   CLINICAL DATA:  Head trauma secondary to a fall.  EXAM: CT HEAD WITHOUT CONTRAST  TECHNIQUE: Contiguous axial images were obtained from the base of the skull through the vertex without intravenous contrast.  COMPARISON:  Brain MRI dated 03/07/2013  FINDINGS: No mass lesion. No midline shift. No acute hemorrhage or hematoma. No extra-axial fluid collections. No evidence of acute infarction. There is diffuse cerebral cortical atrophy. There are scattered areas of periventricular white matter lucency including an old infarct in the anterior limb of the right internal capsule. The osseous structures are normal. Scalp hematoma over the right posterior parietal region.  IMPRESSION: Scalp hematoma. No acute intracranial abnormality. Atrophy with chronic small vessel ischemic changes and small old lacunar infarct in the right internal capsule.   Electronically Signed   By: Rozetta Nunnery M.D.   On: 05/11/2014 17:42     EKG Interpretation None      MDM   Final diagnoses:  Fall  Hematoma    DDx includes: - Mechanical falls - ICH - Fractures - Contusions - Soft tissue injury  Pt comes in with fall, headache and  hematoma. CT head ordered - and is neg. Pt has ambulated. Stable for discharge.   Varney Biles, MD 05/11/14 1815

## 2014-06-19 DIAGNOSIS — H3532 Exudative age-related macular degeneration: Secondary | ICD-10-CM | POA: Diagnosis not present

## 2014-06-19 DIAGNOSIS — H35051 Retinal neovascularization, unspecified, right eye: Secondary | ICD-10-CM | POA: Diagnosis not present

## 2014-06-19 DIAGNOSIS — H3530 Unspecified macular degeneration: Secondary | ICD-10-CM | POA: Diagnosis not present

## 2014-06-20 DIAGNOSIS — Z736 Limitation of activities due to disability: Secondary | ICD-10-CM | POA: Diagnosis not present

## 2014-06-20 DIAGNOSIS — G1221 Amyotrophic lateral sclerosis: Secondary | ICD-10-CM | POA: Diagnosis not present

## 2014-07-04 ENCOUNTER — Other Ambulatory Visit: Payer: Self-pay | Admitting: *Deleted

## 2014-07-04 ENCOUNTER — Other Ambulatory Visit: Payer: Self-pay | Admitting: Family Medicine

## 2014-07-04 MED ORDER — SIMVASTATIN 20 MG PO TABS: ORAL_TABLET | ORAL | Status: DC

## 2014-07-04 MED ORDER — AMLODIPINE BESYLATE 5 MG PO TABS: 5.0000 mg | ORAL_TABLET | Freq: Every day | ORAL | Status: AC

## 2014-08-02 ENCOUNTER — Encounter: Payer: Self-pay | Admitting: Family Medicine

## 2014-08-02 ENCOUNTER — Ambulatory Visit: Payer: Medicare Other

## 2014-08-02 ENCOUNTER — Ambulatory Visit (INDEPENDENT_AMBULATORY_CARE_PROVIDER_SITE_OTHER): Payer: Medicare Other | Admitting: Family Medicine

## 2014-08-02 VITALS — BP 152/80 | HR 56 | Temp 97.7°F | Ht 62.0 in | Wt 98.0 lb

## 2014-08-02 DIAGNOSIS — E7800 Pure hypercholesterolemia, unspecified: Secondary | ICD-10-CM

## 2014-08-02 DIAGNOSIS — K5900 Constipation, unspecified: Secondary | ICD-10-CM

## 2014-08-02 DIAGNOSIS — I10 Essential (primary) hypertension: Secondary | ICD-10-CM

## 2014-08-02 DIAGNOSIS — Z5181 Encounter for therapeutic drug level monitoring: Secondary | ICD-10-CM

## 2014-08-02 DIAGNOSIS — R110 Nausea: Secondary | ICD-10-CM

## 2014-08-02 DIAGNOSIS — G1221 Amyotrophic lateral sclerosis: Secondary | ICD-10-CM

## 2014-08-02 DIAGNOSIS — D649 Anemia, unspecified: Secondary | ICD-10-CM | POA: Diagnosis not present

## 2014-08-02 DIAGNOSIS — L989 Disorder of the skin and subcutaneous tissue, unspecified: Secondary | ICD-10-CM

## 2014-08-02 DIAGNOSIS — R001 Bradycardia, unspecified: Secondary | ICD-10-CM | POA: Diagnosis not present

## 2014-08-02 DIAGNOSIS — R238 Other skin changes: Secondary | ICD-10-CM

## 2014-08-02 NOTE — Patient Instructions (Signed)
Restart Miralax every 3 days.  Consider using stool softener daily such as Colace.  For the vaginal area--you can re-try using the premarin cream to help with atrophic changes.   Keep the vaginal area clean.  Use antibacterial ointment to the small scab on the toe.   Return if any redness, warmth, swelling for re-evaluation.

## 2014-08-02 NOTE — Progress Notes (Signed)
Chief Complaint  Patient presents with  . Nausea    over the last 4-5 weeks has been having nausea off and on as well as stomach issues, constipation.   . OTHER    also has some some bed sores and a sore on her right foot as well.    She has been complaining of nausea intermittently over the last 4-6 weeks.  Appetite seems okay when not nauseated, but friends state she doesn't eat much.  Denies vomiting, heartburn. She has been having some coke and crackers to help with nausea.  She is complaining of constipation.  She is taking dulcolax tablets prn, a few times in the last few weeks, last time was 2 nights ago.  She gets good results with this. She sometimes uses metamucil twice daily, other times just once daily.  She previously did well on a regimen using miralax every 3rd day.  Stopped at one point after having diarrhea and hasn't restarted it.  Last took Miralax about a month ago, at least.  She continues to be followed at [facility] for [place].  She is still living at home, but recognizes that it is now time to consider living in Assisted Living. She is noting some more trouble with chewing, gets tired of eating. Denies any difficulty swallowing/dysphagia, choking.  Gets Meals on Wheels for lunch, doesn't really have a choice as to what foods she gets. Has Boost shakes if she can't eat the food brought.  She is complaining of vaginal pain--she thinks it is related to sliding and irritating the skin with transferring.  No visible sores, but it hurts with "scooting". Denies bleeding, bruising, vaginal discharge. Caregivers/friends are concerned that she needs more physical therapy.  Prior PT finished.  They feel that she isn't moving around very much, which is contributing to constipation.  They feel that she could improve her transfers, to avoid "scooting" which is causing skin irritation.  She also noted a lesion on her right foot today.  Doesn't complaint of pain drainage, just saw it  today  PMH, PSH, SH reviewed.  Outpatient Encounter Prescriptions as of 08/02/2014  Medication Sig Note  . amLODipine (NORVASC) 5 MG tablet Take 1 tablet (5 mg total) by mouth daily.   Marland Kitchen aspirin 81 MG tablet Take 81 mg by mouth daily.     . bisacodyl (DULCOLAX) 5 MG EC tablet Take 5 mg by mouth daily as needed for moderate constipation. 08/02/2014: Last dose Tuesday noght.   . Cyanocobalamin (B-12) 5000 MCG SUBL Place 1 tablet under the tongue daily.   Marland Kitchen LORazepam (ATIVAN) 0.5 MG tablet Take 0.5 mg by mouth. 1/2 tab am and 1 at night 03/08/2014: Taking 1 tablet at bedtime.  Hasn't needed daytime dose in months  . Multiple Vitamins-Minerals (MULTIVITAMIN WITH MINERALS) tablet Take 1 tablet by mouth daily.     . Multiple Vitamins-Minerals (OCUVITE PO) Take 2 tablets by mouth daily.     Marland Kitchen PREMARIN vaginal cream APPLY 1/2 GRAM VAGINALLY TWICE WEEKLY 08/02/2014: Hasn't used it lately  . psyllium (METAMUCIL) 58.6 % powder Take 1 packet by mouth daily.   . riluzole (RILUTEK) 50 MG tablet Take 50 mg by mouth every 12 (twelve) hours.   . simvastatin (ZOCOR) 20 MG tablet TAKE ONE HALF TABLET BY MOUTH AT BEDTIME    Allergies  Allergen Reactions  . Codeine Other (See Comments)    unknown  . Lexapro [Escitalopram] Nausea Only  . Macrobid [Nitrofurantoin Monohydrate Macrocrystals] Nausea Only  Nausea and headache  . Sulfa Antibiotics Other (See Comments)    dizziness   ROS:  No fevers, chills, chest pain, headache, dizziness. No URI symptoms, cough, shortness of breath.  +nausea, constipation. No dysuria, hematuria. See HPI  PHYSICAL EXAM: BP 152/80 mmHg  Pulse 56  Temp(Src) 97.7 F (36.5 C) (Tympanic)  Ht _0  (1.575 m)  Wt 98 lb (44.453 kg)  BMI 17.92 kg/m2 Thin, elderly female, in good spirits, accompanied by 2 other adults HEENT: PERRL, EOMI, conjunctiva clear.  OP clear Neck: No lymphadenopathy, thyromegaly or mass Heart: Somewhat Irregularly irregular, vs frequent ectopy. Upon  listening further, appears to have sinus bradycardia, with frequent ectopy (review of prior EKG shows this is consistent with prior rhythm--02/2014) Lungs: clear bilaterally Back: no CVA tenderness Abdomen: soft. Normal bowel sounds. Abdomen nondistended, nontender, no mass GU:  Some atrophic changes noted. There appears to be irritation (with perhaps very small, superficial skin irritation/fissure on the left). No surrounding erythema or swelling Extremities: no edema. Right great toe, at the distal 1st MTP there is an area of mild erythema, very superfiicial scab.  No soft tissue swelling, surrounding erythema or drainage or crusting. Psych: normal mood, affect, hygiene and grooming. Normal eye contact and speech  ASSESSMENT/PLAN:  Constipation, unspecified constipation type - discussed fluid intake, high fiber diet. restart Miralax; consider stool softeners - Plan: TSH  Essential hypertension, benign - overall controlled.  continue current meds - Plan: Comprehensive metabolic panel  Sinus bradycardia - with frequent PAC's. She has f/u with Dr. Woody Seller in March. continue current meds  Pure hypercholesterolemia - Plan: Comprehensive metabolic panel  Anemia, unspecified anemia type - Plan: CBC with Differential/Platelet  Medication monitoring encounter - Plan: Comprehensive metabolic panel, CBC with Differential/Platelet, TSH  Nausea without vomiting - mild. eat frequently, bland foods. not significant enough to warrant anti-emetics at this time, which might have risks due to side effects (re: fall risk)  Skin irritation - related to "scooting"/transfers, and sensitive, atrophic skin in vaginal area.  use premarin cream prn. avoid further trauma. PT  ALS (amyotrophic lateral sclerosis)   CBC, c-met, TSH (no lipids--fine las ttime, nonfasting)_  Interim home health Re-eval for PT; help with transfers [place] fissure related to scooting General strength and eating  eval  Restart miralax. Consider adding colace stool softener. Continue metamucil and drinking adequate fluid/water.  Discussed assisted living (in a positive way, to see benefits rather than sadness about leaving home). There were questions about facilities, cost.  Recommended discussing with social worker and visiting facilities.  30 min visit, more than 1/2 spent counseling, answering questions.

## 2014-08-03 DIAGNOSIS — I10 Essential (primary) hypertension: Secondary | ICD-10-CM | POA: Diagnosis not present

## 2014-08-03 LAB — CBC WITH DIFFERENTIAL/PLATELET
Basophils Absolute: 0.1 10*3/uL (ref 0.0–0.1)
Basophils Relative: 1 % (ref 0–1)
Eosinophils Absolute: 0.1 10*3/uL (ref 0.0–0.7)
Eosinophils Relative: 1 % (ref 0–5)
HCT: 36.9 % (ref 36.0–46.0)
Hemoglobin: 12.2 g/dL (ref 12.0–15.0)
Lymphocytes Relative: 22 % (ref 12–46)
Lymphs Abs: 1.5 10*3/uL (ref 0.7–4.0)
MCH: 31.6 pg (ref 26.0–34.0)
MCHC: 33.1 g/dL (ref 30.0–36.0)
MCV: 95.6 fL (ref 78.0–100.0)
MONOS PCT: 12 % (ref 3–12)
MPV: 9.5 fL (ref 8.6–12.4)
Monocytes Absolute: 0.8 10*3/uL (ref 0.1–1.0)
NEUTROS ABS: 4.2 10*3/uL (ref 1.7–7.7)
NEUTROS PCT: 64 % (ref 43–77)
PLATELETS: 246 10*3/uL (ref 150–400)
RBC: 3.86 MIL/uL — ABNORMAL LOW (ref 3.87–5.11)
RDW: 13.3 % (ref 11.5–15.5)
WBC: 6.6 10*3/uL (ref 4.0–10.5)

## 2014-08-03 LAB — COMPREHENSIVE METABOLIC PANEL
ALBUMIN: 4.1 g/dL (ref 3.5–5.2)
ALK PHOS: 45 U/L (ref 39–117)
ALT: 9 U/L (ref 0–35)
AST: 13 U/L (ref 0–37)
BUN: 10 mg/dL (ref 6–23)
CO2: 26 mEq/L (ref 19–32)
Calcium: 9.7 mg/dL (ref 8.4–10.5)
Chloride: 103 mEq/L (ref 96–112)
Creat: 0.58 mg/dL (ref 0.50–1.10)
GLUCOSE: 110 mg/dL — AB (ref 70–99)
POTASSIUM: 3.6 meq/L (ref 3.5–5.3)
Sodium: 141 mEq/L (ref 135–145)
Total Bilirubin: 0.5 mg/dL (ref 0.2–1.2)
Total Protein: 6.4 g/dL (ref 6.0–8.3)

## 2014-08-03 LAB — TSH: TSH: 1.64 u[IU]/mL (ref 0.350–4.500)

## 2014-08-06 ENCOUNTER — Telehealth: Payer: Self-pay | Admitting: Family Medicine

## 2014-08-06 ENCOUNTER — Telehealth: Payer: Self-pay | Admitting: *Deleted

## 2014-08-06 NOTE — Telephone Encounter (Signed)
Patient's DIL notified of Dr.Knapp's recommendation.

## 2014-08-06 NOTE — Telephone Encounter (Signed)
Pt says Dr Tomi Bamberger is working on getting her some "help" per her last visit however pt is requesting to speak to [place] to give her more info about getting this help.

## 2014-08-06 NOTE — Telephone Encounter (Signed)
prevnar is recommended. It is getting late for the flu shot, so if she can avoid exposure to sick contacts, I wouldn't push that one at this point.  Prevnar is recommended (not seasonal), and can be given at any time.  The only side effect is usually a local reaction in the arm, similar to with other pneumonia vaccines.  I highly recommend it

## 2014-08-06 NOTE — Telephone Encounter (Signed)
Judeen Hammans, patient's daughter in law called to let you know that patient has not yet had her flu or pneumo vaccine yet. Patient is worried about side effects and really does not want to have either. Judeen Hammans would like to know if she should have, please advise. (just an FYI - we no longer have any HD Flu left)

## 2014-08-11 DIAGNOSIS — G1221 Amyotrophic lateral sclerosis: Secondary | ICD-10-CM | POA: Diagnosis not present

## 2014-08-11 DIAGNOSIS — R262 Difficulty in walking, not elsewhere classified: Secondary | ICD-10-CM | POA: Diagnosis not present

## 2014-08-11 DIAGNOSIS — I158 Other secondary hypertension: Secondary | ICD-10-CM | POA: Diagnosis not present

## 2014-08-11 DIAGNOSIS — M6281 Muscle weakness (generalized): Secondary | ICD-10-CM | POA: Diagnosis not present

## 2014-08-14 ENCOUNTER — Telehealth: Payer: Self-pay | Admitting: Family Medicine

## 2014-08-14 DIAGNOSIS — M6281 Muscle weakness (generalized): Secondary | ICD-10-CM | POA: Diagnosis not present

## 2014-08-14 DIAGNOSIS — G1221 Amyotrophic lateral sclerosis: Secondary | ICD-10-CM | POA: Diagnosis not present

## 2014-08-14 DIAGNOSIS — I158 Other secondary hypertension: Secondary | ICD-10-CM | POA: Diagnosis not present

## 2014-08-14 DIAGNOSIS — R262 Difficulty in walking, not elsewhere classified: Secondary | ICD-10-CM | POA: Diagnosis not present

## 2014-08-14 NOTE — Telephone Encounter (Signed)
I spoke with patient.  She hadn't been using Miralax regularly (took a dose last week, and took another dose today). She has been taking Metamucil.  She denies significant discomfort.  Recommended taking miralax once daily, and to back off on either dose or frequency once she gets good results (or too frequent/loose of stool).  She is aware that it might take a few days of daily use for good results.  If she gets more uncomfortable we discussed faster acting medications (milk of magnesia, dulcolax suppositories).

## 2014-08-14 NOTE — Telephone Encounter (Signed)
Dtr in law called & states they have tried all the suggestions for the severe constipation and nothing is helping.  Only 2 bowel movements in 10 days.  Please call & let her know what they should do.  Says pt is upset and worried.

## 2014-08-16 DIAGNOSIS — M6281 Muscle weakness (generalized): Secondary | ICD-10-CM | POA: Diagnosis not present

## 2014-08-16 DIAGNOSIS — R262 Difficulty in walking, not elsewhere classified: Secondary | ICD-10-CM | POA: Diagnosis not present

## 2014-08-16 DIAGNOSIS — I158 Other secondary hypertension: Secondary | ICD-10-CM | POA: Diagnosis not present

## 2014-08-16 DIAGNOSIS — G1221 Amyotrophic lateral sclerosis: Secondary | ICD-10-CM | POA: Diagnosis not present

## 2014-08-18 DIAGNOSIS — R262 Difficulty in walking, not elsewhere classified: Secondary | ICD-10-CM | POA: Diagnosis not present

## 2014-08-18 DIAGNOSIS — G1221 Amyotrophic lateral sclerosis: Secondary | ICD-10-CM | POA: Diagnosis not present

## 2014-08-18 DIAGNOSIS — I158 Other secondary hypertension: Secondary | ICD-10-CM | POA: Diagnosis not present

## 2014-08-18 DIAGNOSIS — M6281 Muscle weakness (generalized): Secondary | ICD-10-CM | POA: Diagnosis not present

## 2014-08-21 DIAGNOSIS — M6281 Muscle weakness (generalized): Secondary | ICD-10-CM | POA: Diagnosis not present

## 2014-08-21 DIAGNOSIS — G1221 Amyotrophic lateral sclerosis: Secondary | ICD-10-CM | POA: Diagnosis not present

## 2014-08-21 DIAGNOSIS — R262 Difficulty in walking, not elsewhere classified: Secondary | ICD-10-CM | POA: Diagnosis not present

## 2014-08-21 DIAGNOSIS — I158 Other secondary hypertension: Secondary | ICD-10-CM | POA: Diagnosis not present

## 2014-08-23 DIAGNOSIS — G1221 Amyotrophic lateral sclerosis: Secondary | ICD-10-CM | POA: Diagnosis not present

## 2014-08-23 DIAGNOSIS — R262 Difficulty in walking, not elsewhere classified: Secondary | ICD-10-CM | POA: Diagnosis not present

## 2014-08-23 DIAGNOSIS — M6281 Muscle weakness (generalized): Secondary | ICD-10-CM | POA: Diagnosis not present

## 2014-08-23 DIAGNOSIS — I158 Other secondary hypertension: Secondary | ICD-10-CM | POA: Diagnosis not present

## 2014-08-25 DIAGNOSIS — I158 Other secondary hypertension: Secondary | ICD-10-CM | POA: Diagnosis not present

## 2014-08-25 DIAGNOSIS — R262 Difficulty in walking, not elsewhere classified: Secondary | ICD-10-CM | POA: Diagnosis not present

## 2014-08-25 DIAGNOSIS — G1221 Amyotrophic lateral sclerosis: Secondary | ICD-10-CM | POA: Diagnosis not present

## 2014-08-25 DIAGNOSIS — M6281 Muscle weakness (generalized): Secondary | ICD-10-CM | POA: Diagnosis not present

## 2014-08-27 ENCOUNTER — Telehealth: Payer: Self-pay | Admitting: *Deleted

## 2014-08-27 NOTE — Telephone Encounter (Signed)
Patient called and said that this past weekend she developed a cough. Not sure if she has sinus infection/cold or it's just allergies. She would like to take an OTC cough medicine but wanted to check with you first to see what you recommend as she is taking lorazepam and does not want it to interfere. Thanks.

## 2014-08-27 NOTE — Telephone Encounter (Signed)
Patient advised and verbalized understanding 

## 2014-08-27 NOTE — Telephone Encounter (Signed)
She can take robitussin or robitussin DM. She can also take claritin if needed for allergies

## 2014-08-28 ENCOUNTER — Other Ambulatory Visit: Payer: Self-pay | Admitting: Family Medicine

## 2014-08-28 DIAGNOSIS — M6281 Muscle weakness (generalized): Secondary | ICD-10-CM | POA: Diagnosis not present

## 2014-08-28 DIAGNOSIS — R262 Difficulty in walking, not elsewhere classified: Secondary | ICD-10-CM | POA: Diagnosis not present

## 2014-08-28 DIAGNOSIS — G1221 Amyotrophic lateral sclerosis: Secondary | ICD-10-CM | POA: Diagnosis not present

## 2014-08-28 DIAGNOSIS — I158 Other secondary hypertension: Secondary | ICD-10-CM | POA: Diagnosis not present

## 2014-08-30 DIAGNOSIS — M6281 Muscle weakness (generalized): Secondary | ICD-10-CM | POA: Diagnosis not present

## 2014-08-30 DIAGNOSIS — I158 Other secondary hypertension: Secondary | ICD-10-CM | POA: Diagnosis not present

## 2014-08-30 DIAGNOSIS — R262 Difficulty in walking, not elsewhere classified: Secondary | ICD-10-CM | POA: Diagnosis not present

## 2014-08-30 DIAGNOSIS — G1221 Amyotrophic lateral sclerosis: Secondary | ICD-10-CM | POA: Diagnosis not present

## 2014-09-01 DIAGNOSIS — G1221 Amyotrophic lateral sclerosis: Secondary | ICD-10-CM | POA: Diagnosis not present

## 2014-09-01 DIAGNOSIS — M6281 Muscle weakness (generalized): Secondary | ICD-10-CM | POA: Diagnosis not present

## 2014-09-01 DIAGNOSIS — I158 Other secondary hypertension: Secondary | ICD-10-CM | POA: Diagnosis not present

## 2014-09-01 DIAGNOSIS — R262 Difficulty in walking, not elsewhere classified: Secondary | ICD-10-CM | POA: Diagnosis not present

## 2014-09-04 DIAGNOSIS — R262 Difficulty in walking, not elsewhere classified: Secondary | ICD-10-CM | POA: Diagnosis not present

## 2014-09-04 DIAGNOSIS — G1221 Amyotrophic lateral sclerosis: Secondary | ICD-10-CM | POA: Diagnosis not present

## 2014-09-04 DIAGNOSIS — M6281 Muscle weakness (generalized): Secondary | ICD-10-CM | POA: Diagnosis not present

## 2014-09-04 DIAGNOSIS — I158 Other secondary hypertension: Secondary | ICD-10-CM | POA: Diagnosis not present

## 2014-09-05 DIAGNOSIS — R262 Difficulty in walking, not elsewhere classified: Secondary | ICD-10-CM | POA: Diagnosis not present

## 2014-09-05 DIAGNOSIS — G1221 Amyotrophic lateral sclerosis: Secondary | ICD-10-CM | POA: Diagnosis not present

## 2014-09-05 DIAGNOSIS — M6281 Muscle weakness (generalized): Secondary | ICD-10-CM | POA: Diagnosis not present

## 2014-09-05 DIAGNOSIS — I158 Other secondary hypertension: Secondary | ICD-10-CM | POA: Diagnosis not present

## 2014-09-06 ENCOUNTER — Ambulatory Visit (INDEPENDENT_AMBULATORY_CARE_PROVIDER_SITE_OTHER): Payer: Medicare Other | Admitting: Podiatry

## 2014-09-06 DIAGNOSIS — M79673 Pain in unspecified foot: Secondary | ICD-10-CM

## 2014-09-06 DIAGNOSIS — B351 Tinea unguium: Secondary | ICD-10-CM

## 2014-09-06 NOTE — Progress Notes (Signed)
She presents today with chief complaint of painful elongated toenails 1 through 5 bilateral.  Objective: Nails are thick yellow dystrophic mycotic bilateral.  Assessment: Patient and limb secondary to onychomycosis 1 through 5 bilateral.  Plan: Debridement of nails 1 through 5 bilateral cover service pain.

## 2014-09-08 DIAGNOSIS — R262 Difficulty in walking, not elsewhere classified: Secondary | ICD-10-CM | POA: Diagnosis not present

## 2014-09-08 DIAGNOSIS — I158 Other secondary hypertension: Secondary | ICD-10-CM | POA: Diagnosis not present

## 2014-09-08 DIAGNOSIS — G1221 Amyotrophic lateral sclerosis: Secondary | ICD-10-CM | POA: Diagnosis not present

## 2014-09-08 DIAGNOSIS — M6281 Muscle weakness (generalized): Secondary | ICD-10-CM | POA: Diagnosis not present

## 2014-09-11 DIAGNOSIS — I158 Other secondary hypertension: Secondary | ICD-10-CM | POA: Diagnosis not present

## 2014-09-11 DIAGNOSIS — R262 Difficulty in walking, not elsewhere classified: Secondary | ICD-10-CM | POA: Diagnosis not present

## 2014-09-11 DIAGNOSIS — G1221 Amyotrophic lateral sclerosis: Secondary | ICD-10-CM | POA: Diagnosis not present

## 2014-09-11 DIAGNOSIS — M6281 Muscle weakness (generalized): Secondary | ICD-10-CM | POA: Diagnosis not present

## 2014-09-13 DIAGNOSIS — I158 Other secondary hypertension: Secondary | ICD-10-CM | POA: Diagnosis not present

## 2014-09-13 DIAGNOSIS — G1221 Amyotrophic lateral sclerosis: Secondary | ICD-10-CM | POA: Diagnosis not present

## 2014-09-13 DIAGNOSIS — R262 Difficulty in walking, not elsewhere classified: Secondary | ICD-10-CM | POA: Diagnosis not present

## 2014-09-13 DIAGNOSIS — M6281 Muscle weakness (generalized): Secondary | ICD-10-CM | POA: Diagnosis not present

## 2014-09-15 DIAGNOSIS — I158 Other secondary hypertension: Secondary | ICD-10-CM | POA: Diagnosis not present

## 2014-09-15 DIAGNOSIS — R262 Difficulty in walking, not elsewhere classified: Secondary | ICD-10-CM | POA: Diagnosis not present

## 2014-09-15 DIAGNOSIS — M6281 Muscle weakness (generalized): Secondary | ICD-10-CM | POA: Diagnosis not present

## 2014-09-15 DIAGNOSIS — G1221 Amyotrophic lateral sclerosis: Secondary | ICD-10-CM | POA: Diagnosis not present

## 2014-09-18 DIAGNOSIS — R262 Difficulty in walking, not elsewhere classified: Secondary | ICD-10-CM | POA: Diagnosis not present

## 2014-09-18 DIAGNOSIS — G1221 Amyotrophic lateral sclerosis: Secondary | ICD-10-CM | POA: Diagnosis not present

## 2014-09-18 DIAGNOSIS — I158 Other secondary hypertension: Secondary | ICD-10-CM | POA: Diagnosis not present

## 2014-09-18 DIAGNOSIS — M6281 Muscle weakness (generalized): Secondary | ICD-10-CM | POA: Diagnosis not present

## 2014-09-20 DIAGNOSIS — M6281 Muscle weakness (generalized): Secondary | ICD-10-CM | POA: Diagnosis not present

## 2014-09-20 DIAGNOSIS — R262 Difficulty in walking, not elsewhere classified: Secondary | ICD-10-CM | POA: Diagnosis not present

## 2014-09-20 DIAGNOSIS — G1221 Amyotrophic lateral sclerosis: Secondary | ICD-10-CM | POA: Diagnosis not present

## 2014-09-20 DIAGNOSIS — I158 Other secondary hypertension: Secondary | ICD-10-CM | POA: Diagnosis not present

## 2014-09-22 DIAGNOSIS — G1221 Amyotrophic lateral sclerosis: Secondary | ICD-10-CM | POA: Diagnosis not present

## 2014-09-22 DIAGNOSIS — I158 Other secondary hypertension: Secondary | ICD-10-CM | POA: Diagnosis not present

## 2014-09-22 DIAGNOSIS — M6281 Muscle weakness (generalized): Secondary | ICD-10-CM | POA: Diagnosis not present

## 2014-09-22 DIAGNOSIS — R262 Difficulty in walking, not elsewhere classified: Secondary | ICD-10-CM | POA: Diagnosis not present

## 2014-09-25 DIAGNOSIS — M6281 Muscle weakness (generalized): Secondary | ICD-10-CM | POA: Diagnosis not present

## 2014-09-25 DIAGNOSIS — I158 Other secondary hypertension: Secondary | ICD-10-CM | POA: Diagnosis not present

## 2014-09-25 DIAGNOSIS — G1221 Amyotrophic lateral sclerosis: Secondary | ICD-10-CM | POA: Diagnosis not present

## 2014-09-25 DIAGNOSIS — R262 Difficulty in walking, not elsewhere classified: Secondary | ICD-10-CM | POA: Diagnosis not present

## 2014-09-27 DIAGNOSIS — G1221 Amyotrophic lateral sclerosis: Secondary | ICD-10-CM | POA: Diagnosis not present

## 2014-09-27 DIAGNOSIS — I158 Other secondary hypertension: Secondary | ICD-10-CM | POA: Diagnosis not present

## 2014-09-27 DIAGNOSIS — R262 Difficulty in walking, not elsewhere classified: Secondary | ICD-10-CM | POA: Diagnosis not present

## 2014-09-27 DIAGNOSIS — M6281 Muscle weakness (generalized): Secondary | ICD-10-CM | POA: Diagnosis not present

## 2014-09-29 DIAGNOSIS — R262 Difficulty in walking, not elsewhere classified: Secondary | ICD-10-CM | POA: Diagnosis not present

## 2014-09-29 DIAGNOSIS — I158 Other secondary hypertension: Secondary | ICD-10-CM | POA: Diagnosis not present

## 2014-09-29 DIAGNOSIS — M6281 Muscle weakness (generalized): Secondary | ICD-10-CM | POA: Diagnosis not present

## 2014-09-29 DIAGNOSIS — G1221 Amyotrophic lateral sclerosis: Secondary | ICD-10-CM | POA: Diagnosis not present

## 2014-10-03 DIAGNOSIS — G1221 Amyotrophic lateral sclerosis: Secondary | ICD-10-CM | POA: Diagnosis not present

## 2014-10-03 DIAGNOSIS — R633 Feeding difficulties: Secondary | ICD-10-CM | POA: Diagnosis not present

## 2014-10-03 DIAGNOSIS — R262 Difficulty in walking, not elsewhere classified: Secondary | ICD-10-CM | POA: Diagnosis not present

## 2014-10-03 DIAGNOSIS — R531 Weakness: Secondary | ICD-10-CM | POA: Diagnosis not present

## 2014-10-04 DIAGNOSIS — G1221 Amyotrophic lateral sclerosis: Secondary | ICD-10-CM | POA: Diagnosis not present

## 2014-10-04 DIAGNOSIS — M6281 Muscle weakness (generalized): Secondary | ICD-10-CM | POA: Diagnosis not present

## 2014-10-04 DIAGNOSIS — R262 Difficulty in walking, not elsewhere classified: Secondary | ICD-10-CM | POA: Diagnosis not present

## 2014-10-04 DIAGNOSIS — I158 Other secondary hypertension: Secondary | ICD-10-CM | POA: Diagnosis not present

## 2014-10-05 DIAGNOSIS — M6281 Muscle weakness (generalized): Secondary | ICD-10-CM | POA: Diagnosis not present

## 2014-10-05 DIAGNOSIS — G1221 Amyotrophic lateral sclerosis: Secondary | ICD-10-CM | POA: Diagnosis not present

## 2014-10-05 DIAGNOSIS — R262 Difficulty in walking, not elsewhere classified: Secondary | ICD-10-CM | POA: Diagnosis not present

## 2014-10-05 DIAGNOSIS — I158 Other secondary hypertension: Secondary | ICD-10-CM | POA: Diagnosis not present

## 2014-10-06 DIAGNOSIS — G1221 Amyotrophic lateral sclerosis: Secondary | ICD-10-CM | POA: Diagnosis not present

## 2014-10-06 DIAGNOSIS — M6281 Muscle weakness (generalized): Secondary | ICD-10-CM | POA: Diagnosis not present

## 2014-10-06 DIAGNOSIS — I158 Other secondary hypertension: Secondary | ICD-10-CM | POA: Diagnosis not present

## 2014-10-06 DIAGNOSIS — R262 Difficulty in walking, not elsewhere classified: Secondary | ICD-10-CM | POA: Diagnosis not present

## 2014-10-09 DIAGNOSIS — M6281 Muscle weakness (generalized): Secondary | ICD-10-CM | POA: Diagnosis not present

## 2014-10-09 DIAGNOSIS — G1221 Amyotrophic lateral sclerosis: Secondary | ICD-10-CM | POA: Diagnosis not present

## 2014-10-09 DIAGNOSIS — I158 Other secondary hypertension: Secondary | ICD-10-CM | POA: Diagnosis not present

## 2014-10-09 DIAGNOSIS — R262 Difficulty in walking, not elsewhere classified: Secondary | ICD-10-CM | POA: Diagnosis not present

## 2014-10-11 DIAGNOSIS — M6281 Muscle weakness (generalized): Secondary | ICD-10-CM | POA: Diagnosis not present

## 2014-10-11 DIAGNOSIS — R262 Difficulty in walking, not elsewhere classified: Secondary | ICD-10-CM | POA: Diagnosis not present

## 2014-10-11 DIAGNOSIS — I158 Other secondary hypertension: Secondary | ICD-10-CM | POA: Diagnosis not present

## 2014-10-11 DIAGNOSIS — G1221 Amyotrophic lateral sclerosis: Secondary | ICD-10-CM | POA: Diagnosis not present

## 2014-10-13 DIAGNOSIS — I158 Other secondary hypertension: Secondary | ICD-10-CM | POA: Diagnosis not present

## 2014-10-13 DIAGNOSIS — G1221 Amyotrophic lateral sclerosis: Secondary | ICD-10-CM | POA: Diagnosis not present

## 2014-10-13 DIAGNOSIS — M6281 Muscle weakness (generalized): Secondary | ICD-10-CM | POA: Diagnosis not present

## 2014-10-13 DIAGNOSIS — R262 Difficulty in walking, not elsewhere classified: Secondary | ICD-10-CM | POA: Diagnosis not present

## 2014-10-16 DIAGNOSIS — R262 Difficulty in walking, not elsewhere classified: Secondary | ICD-10-CM | POA: Diagnosis not present

## 2014-10-16 DIAGNOSIS — G1221 Amyotrophic lateral sclerosis: Secondary | ICD-10-CM | POA: Diagnosis not present

## 2014-10-16 DIAGNOSIS — M6281 Muscle weakness (generalized): Secondary | ICD-10-CM | POA: Diagnosis not present

## 2014-10-16 DIAGNOSIS — I158 Other secondary hypertension: Secondary | ICD-10-CM | POA: Diagnosis not present

## 2014-10-18 DIAGNOSIS — G1221 Amyotrophic lateral sclerosis: Secondary | ICD-10-CM | POA: Diagnosis not present

## 2014-10-18 DIAGNOSIS — R262 Difficulty in walking, not elsewhere classified: Secondary | ICD-10-CM | POA: Diagnosis not present

## 2014-10-18 DIAGNOSIS — I158 Other secondary hypertension: Secondary | ICD-10-CM | POA: Diagnosis not present

## 2014-10-18 DIAGNOSIS — M6281 Muscle weakness (generalized): Secondary | ICD-10-CM | POA: Diagnosis not present

## 2014-10-30 DIAGNOSIS — H3532 Exudative age-related macular degeneration: Secondary | ICD-10-CM | POA: Diagnosis not present

## 2014-11-08 DIAGNOSIS — G1221 Amyotrophic lateral sclerosis: Secondary | ICD-10-CM | POA: Diagnosis not present

## 2014-11-08 DIAGNOSIS — M6281 Muscle weakness (generalized): Secondary | ICD-10-CM | POA: Diagnosis not present

## 2014-11-30 ENCOUNTER — Inpatient Hospital Stay (HOSPITAL_COMMUNITY)
Admission: EM | Admit: 2014-11-30 | Discharge: 2014-12-03 | DRG: 563 | Disposition: A | Discharge status: Skilled Nursing Facility | Attending: Orthopedic Surgery | Admitting: Orthopedic Surgery

## 2014-11-30 ENCOUNTER — Emergency Department (HOSPITAL_COMMUNITY)

## 2014-11-30 ENCOUNTER — Encounter (HOSPITAL_COMMUNITY): Payer: Self-pay | Admitting: Emergency Medicine

## 2014-11-30 DIAGNOSIS — E785 Hyperlipidemia, unspecified: Secondary | ICD-10-CM | POA: Diagnosis present

## 2014-11-30 DIAGNOSIS — M810 Age-related osteoporosis without current pathological fracture: Secondary | ICD-10-CM | POA: Diagnosis present

## 2014-11-30 DIAGNOSIS — Z7982 Long term (current) use of aspirin: Secondary | ICD-10-CM

## 2014-11-30 DIAGNOSIS — I340 Nonrheumatic mitral (valve) insufficiency: Secondary | ICD-10-CM | POA: Diagnosis present

## 2014-11-30 DIAGNOSIS — G1221 Amyotrophic lateral sclerosis: Secondary | ICD-10-CM | POA: Diagnosis present

## 2014-11-30 DIAGNOSIS — Y92019 Unspecified place in single-family (private) house as the place of occurrence of the external cause: Secondary | ICD-10-CM

## 2014-11-30 DIAGNOSIS — H3530 Unspecified macular degeneration: Secondary | ICD-10-CM | POA: Diagnosis present

## 2014-11-30 DIAGNOSIS — Z87891 Personal history of nicotine dependence: Secondary | ICD-10-CM

## 2014-11-30 DIAGNOSIS — S82101A Unspecified fracture of upper end of right tibia, initial encounter for closed fracture: Principal | ICD-10-CM | POA: Diagnosis present

## 2014-11-30 DIAGNOSIS — S82831A Other fracture of upper and lower end of right fibula, initial encounter for closed fracture: Secondary | ICD-10-CM | POA: Diagnosis present

## 2014-11-30 DIAGNOSIS — M1711 Unilateral primary osteoarthritis, right knee: Secondary | ICD-10-CM | POA: Diagnosis not present

## 2014-11-30 DIAGNOSIS — Z85828 Personal history of other malignant neoplasm of skin: Secondary | ICD-10-CM

## 2014-11-30 DIAGNOSIS — H9191 Unspecified hearing loss, right ear: Secondary | ICD-10-CM | POA: Diagnosis present

## 2014-11-30 DIAGNOSIS — T1490XA Injury, unspecified, initial encounter: Secondary | ICD-10-CM

## 2014-11-30 DIAGNOSIS — M25569 Pain in unspecified knee: Secondary | ICD-10-CM | POA: Diagnosis not present

## 2014-11-30 DIAGNOSIS — W1830XA Fall on same level, unspecified, initial encounter: Secondary | ICD-10-CM | POA: Diagnosis present

## 2014-11-30 DIAGNOSIS — M25561 Pain in right knee: Secondary | ICD-10-CM | POA: Diagnosis present

## 2014-11-30 DIAGNOSIS — I10 Essential (primary) hypertension: Secondary | ICD-10-CM | POA: Diagnosis present

## 2014-11-30 DIAGNOSIS — I272 Other secondary pulmonary hypertension: Secondary | ICD-10-CM | POA: Diagnosis present

## 2014-11-30 DIAGNOSIS — Z79899 Other long term (current) drug therapy: Secondary | ICD-10-CM

## 2014-11-30 DIAGNOSIS — S82111A Displaced fracture of right tibial spine, initial encounter for closed fracture: Secondary | ICD-10-CM | POA: Diagnosis not present

## 2014-11-30 DIAGNOSIS — M25562 Pain in left knee: Secondary | ICD-10-CM | POA: Diagnosis not present

## 2014-11-30 DIAGNOSIS — S82201A Unspecified fracture of shaft of right tibia, initial encounter for closed fracture: Secondary | ICD-10-CM | POA: Diagnosis present

## 2014-11-30 DIAGNOSIS — S82891A Other fracture of right lower leg, initial encounter for closed fracture: Secondary | ICD-10-CM | POA: Diagnosis not present

## 2014-11-30 DIAGNOSIS — S82291A Other fracture of shaft of right tibia, initial encounter for closed fracture: Secondary | ICD-10-CM

## 2014-11-30 LAB — TYPE AND SCREEN
ABO/RH(D): O POS
Antibody Screen: NEGATIVE

## 2014-11-30 LAB — I-STAT CHEM 8, ED
BUN: 15 mg/dL (ref 6–20)
CALCIUM ION: 1.11 mmol/L — AB (ref 1.13–1.30)
CHLORIDE: 102 mmol/L (ref 101–111)
Creatinine, Ser: 0.7 mg/dL (ref 0.44–1.00)
GLUCOSE: 138 mg/dL — AB (ref 65–99)
HCT: 36 % (ref 36.0–46.0)
HEMOGLOBIN: 12.2 g/dL (ref 12.0–15.0)
Potassium: 3.6 mmol/L (ref 3.5–5.1)
Sodium: 139 mmol/L (ref 135–145)
TCO2: 26 mmol/L (ref 0–100)

## 2014-11-30 LAB — CBC
HCT: 35.6 % — ABNORMAL LOW (ref 36.0–46.0)
HEMOGLOBIN: 11.8 g/dL — AB (ref 12.0–15.0)
MCH: 31.4 pg (ref 26.0–34.0)
MCHC: 33.1 g/dL (ref 30.0–36.0)
MCV: 94.7 fL (ref 78.0–100.0)
Platelets: 186 10*3/uL (ref 150–400)
RBC: 3.76 MIL/uL — ABNORMAL LOW (ref 3.87–5.11)
RDW: 13.1 % (ref 11.5–15.5)
WBC: 9.3 10*3/uL (ref 4.0–10.5)

## 2014-11-30 LAB — ABO/RH: ABO/RH(D): O POS

## 2014-11-30 MED ORDER — B-12 5000 MCG SL SUBL: 1.0000 | SUBLINGUAL_TABLET | Freq: Every day | SUBLINGUAL | Status: DC

## 2014-11-30 MED ORDER — ENOXAPARIN SODIUM 40 MG/0.4ML ~~LOC~~ SOLN
40.0000 mg | SUBCUTANEOUS | Status: DC
  Administered 2014-12-01 – 2014-12-03 (×3): 40 mg via SUBCUTANEOUS
  Filled 2014-11-30 (×3): qty 0.4

## 2014-11-30 MED ORDER — AMLODIPINE BESYLATE 5 MG PO TABS
5.0000 mg | ORAL_TABLET | Freq: Every day | ORAL | Status: DC
  Administered 2014-12-01 – 2014-12-03 (×3): 5 mg via ORAL
  Filled 2014-11-30 (×3): qty 1

## 2014-11-30 MED ORDER — SENNA 8.6 MG PO TABS
1.0000 | ORAL_TABLET | Freq: Two times a day (BID) | ORAL | Status: DC
  Administered 2014-11-30 – 2014-12-03 (×6): 8.6 mg via ORAL
  Filled 2014-11-30 (×6): qty 1

## 2014-11-30 MED ORDER — ASPIRIN EC 81 MG PO TBEC
81.0000 mg | DELAYED_RELEASE_TABLET | Freq: Every day | ORAL | Status: DC
  Administered 2014-11-30 – 2014-12-03 (×4): 81 mg via ORAL
  Filled 2014-11-30 (×4): qty 1

## 2014-11-30 MED ORDER — SIMVASTATIN 10 MG PO TABS: 10.0000 mg | ORAL_TABLET | Freq: Every day | ORAL | Status: DC

## 2014-11-30 MED ORDER — ACETAMINOPHEN 650 MG RE SUPP: 650.0000 mg | Freq: Four times a day (QID) | RECTAL | Status: DC | PRN

## 2014-11-30 MED ORDER — ASPIRIN 81 MG PO TABS: 81.0000 mg | ORAL_TABLET | Freq: Every day | ORAL | Status: DC

## 2014-11-30 MED ORDER — RILUZOLE 50 MG PO TABS: 50.0000 mg | ORAL_TABLET | Freq: Two times a day (BID) | ORAL | Status: DC

## 2014-11-30 MED ORDER — VITAMIN B-12 1000 MCG PO TABS
5000.0000 ug | ORAL_TABLET | Freq: Every day | ORAL | Status: DC
  Administered 2014-12-01 – 2014-12-02 (×2): 5000 ug via ORAL
  Filled 2014-11-30 (×3): qty 5

## 2014-11-30 MED ORDER — LORAZEPAM 0.5 MG PO TABS
0.5000 mg | ORAL_TABLET | Freq: Every day | ORAL | Status: DC
  Administered 2014-11-30 – 2014-12-02 (×3): 0.5 mg via ORAL
  Filled 2014-11-30 (×3): qty 1

## 2014-11-30 MED ORDER — DOCUSATE SODIUM 100 MG PO CAPS
100.0000 mg | ORAL_CAPSULE | Freq: Two times a day (BID) | ORAL | Status: DC
  Administered 2014-11-30 – 2014-12-03 (×6): 100 mg via ORAL
  Filled 2014-11-30 (×6): qty 1

## 2014-11-30 MED ORDER — ACETAMINOPHEN 325 MG PO TABS
650.0000 mg | ORAL_TABLET | Freq: Four times a day (QID) | ORAL | Status: DC | PRN
  Administered 2014-12-01 – 2014-12-02 (×3): 650 mg via ORAL
  Filled 2014-11-30 (×3): qty 2

## 2014-11-30 MED ORDER — HYDROCODONE-ACETAMINOPHEN 5-325 MG PO TABS
1.0000 | ORAL_TABLET | ORAL | Status: DC | PRN
  Administered 2014-11-30 – 2014-12-03 (×8): 1 via ORAL
  Filled 2014-11-30 (×8): qty 1

## 2014-11-30 NOTE — ED Provider Notes (Signed)
CSN: [id number]     Arrival date & time 11/30/14  1356 History   First MD Initiated Contact with Patient 11/30/14 1359     Chief Complaint  Patient presents with  . Fall  . Leg Pain     (Consider location/radiation/quality/duration/timing/severity/associated sxs/prior Treatment) HPI Comments: The patient is an 81 year old female, she is known to have ALS, and has had chronic weakness which is gradually worsening over time. She feels as though she is chronically off balance and has weakness in her legs, she states that she fell just prior to arrival but cannot tell me why she fell. She fell forward catching herself with her hands and her knees, she was unable to get up by herself because of generalized weakness. She states that she has been in her usual state of health until this happened. She denies fevers chills nausea vomiting chest pain shortness of breath or cough.  Patient is a 81 y.o. female presenting with fall and leg pain. The history is provided by the patient.  Fall  Leg Pain   Past Medical History  Diagnosis Date  . Hypertension   . Osteoporosis   . Migraine (atenolol for prophylaxis)  . Macular degeneration   . Kidney stones, calcium oxalate 1993  . Pulmonary hypertension mild,mild AR,mild-mod MR,mild-mod TR  . Hematuria, microscopic DrKimbrough 1993  . Hiatal hernia   . Diverticulosis (L sided) 1994  . Hyperlipidemia   . BCC (basal cell carcinoma of skin) L cheek-DrLupton (6/09)  . Postmenopausal bleeding 2010-DrCole  . Allergy     s/p immunotherapy  . Mitral regurgitation     mild-mod; mild aortic regurgitation, and mild-mod TR  . Atrophic vaginitis   . Hearing loss     right ear  . B12 deficiency   . ALS (amyotrophic lateral sclerosis)    Past Surgical History  Procedure Laterality Date  . Hemorroidectomy  1953  . Tonsillectomy and adenoidectomy  age 87   Family History  Problem Relation Age of Onset  . Cirrhosis Mother   . Cancer Father     ? they  took out rotten intestines  . Cancer Brother     prostate  . Stroke Brother   . Stroke Brother 76  . Stroke Sister 52  . Breast cancer Sister 71    breast cancer  . Cancer Sister 73    breast cancer  . Heart disease Neg Hx   . Diabetes Neg Hx    History  Substance Use Topics  . Smoking status: Former Smoker    Quit date: 06/15/1978  . Smokeless tobacco: Never Used  . Alcohol Use: No   OB History    Gravida Para Term Preterm AB TAB SAB Ectopic Multiple Living   1 1        1      Review of Systems  All other systems reviewed and are negative.     Allergies  Codeine; Lexapro; Macrobid; and Sulfa antibiotics  Home Medications   Prior to Admission medications   Medication Sig Start Date End Date Taking? Authorizing Provider  amLODipine (NORVASC) 5 MG tablet Take 1 tablet (5 mg total) by mouth daily. 07/04/14   Rita Ohara, MD  aspirin 81 MG tablet Take 81 mg by mouth daily.      Historical Provider, MD  Cyanocobalamin (B-12) 5000 MCG SUBL Place 1 tablet under the tongue daily.    Historical Provider, MD  LORazepam (ATIVAN) 0.5 MG tablet Take 0.5 mg by mouth. 1/2 tab am  and 1 at night 07/21/13   Historical Provider, MD  Multiple Vitamins-Minerals (MULTIVITAMIN WITH MINERALS) tablet Take 1 tablet by mouth daily.      Historical Provider, MD  Multiple Vitamins-Minerals (OCUVITE PO) Take 2 tablets by mouth daily.      Historical Provider, MD  PREMARIN vaginal cream APPLY 1/2 GRAM VAGINALLY TWICE WEEKLY 10/26/13   Rita Ohara, MD  riluzole (RILUTEK) 50 MG tablet Take 50 mg by mouth every 12 (twelve) hours.    Historical Provider, MD  simvastatin (ZOCOR) 20 MG tablet TAKE ONE HALF TABLET BY MOUTH AT BEDTIME 08/28/14   Rita Ohara, MD   BP 128/79 mmHg  Pulse 110  Temp(Src) 97.9 F (36.6 C) (Oral)  Resp 18  SpO2 100% Physical Exam  Constitutional: She appears well-developed and well-nourished. No distress.  HENT:  Head: Normocephalic and atraumatic.  Mouth/Throat: Oropharynx is clear  and moist. No oropharyngeal exudate.  Atraumatic - no bruising, no swelling, no malocclusion, no racoon eyes, no battle's sign  Eyes: Conjunctivae and EOM are normal. Pupils are equal, round, and reactive to light. Right eye exhibits no discharge. Left eye exhibits no discharge. No scleral icterus.  Neck: Normal range of motion. Neck supple. No JVD present. No thyromegaly present.  Cardiovascular: Normal rate, regular rhythm, normal heart sounds and intact distal pulses.  Exam reveals no gallop and no friction rub.   No murmur heard. Pulmonary/Chest: Effort normal and breath sounds normal. No respiratory distress. She has no wheezes. She has no rales.  Abdominal: Soft. Bowel sounds are normal. She exhibits no distension and no mass. There is no tenderness.  Musculoskeletal: Normal range of motion. She exhibits no edema or tenderness.  Bilateral muscle wasting of both the arms and the legs - she is able to straight leg raise bilaterally just off the bed.  She has some swelling over the R prox LE distal to the knee - there is small bruise over the L knee.  Bilateral UE's without deformity - compartments are soft - joints are soft.    Lymphadenopathy:    She has no cervical adenopathy.  Neurological: She is alert. Coordination normal.  Skin: Skin is warm and dry. No rash noted. No erythema.  Psychiatric: She has a normal mood and affect. Her behavior is normal.  Nursing note and vitals reviewed.   ED Course  Procedures (including critical care time) Labs Review Labs Reviewed  CBC - Abnormal; Notable for the following:    RBC 3.76 (*)    Hemoglobin 11.8 (*)    HCT 35.6 (*)    All other components within normal limits  I-STAT CHEM 8, ED - Abnormal; Notable for the following:    Glucose, Bld 138 (*)    Calcium, Ion 1.11 (*)    All other components within normal limits  TYPE AND SCREEN  ABO/RH    Imaging Review Dg Knee Complete 4 Views Left  11/30/2014   CLINICAL DATA:  Left anterior  knee pain and swelling.  EXAM: LEFT KNEE - COMPLETE 4+ VIEW; RIGHT KNEE - COMPLETE 4+ VIEW  COMPARISON:  None.  FINDINGS: Right knee: There is a nondisplaced fracture of the proximal fibular metaphysis. There is a nondisplaced fracture of the proximal tibial metaphysis. There is a moderate size joint effusion. There is severe medial compartment osteoarthritis with severe joint space narrowing with bone-on-bone appearance, subchondral sclerosis and marginal osteophytosis. There is patellofemoral compartment osteoarthritis. There is generalized osteopenia. There is soft tissue swelling around the proximal right lower  leg.  Left knee: The left knee demonstrates no acute fracture or dislocation. There is no significant joint effusion. The soft tissues are normal. There is no lytic or sclerotic osseous lesion. There is generalized osteopenia.  IMPRESSION: 1. Nondisplaced fractures of the proximal fibular metaphysis and proximal tibial metaphysis. 2. No acute osseous injury of the left knee. 3. Severe osteoarthritis of the medial femorotibial compartment of the right knee.   Electronically Signed   By: Kathreen Devoid   On: 11/30/2014 15:03   Dg Knee Complete 4 Views Right  11/30/2014   CLINICAL DATA:  Left anterior knee pain and swelling.  EXAM: LEFT KNEE - COMPLETE 4+ VIEW; RIGHT KNEE - COMPLETE 4+ VIEW  COMPARISON:  None.  FINDINGS: Right knee: There is a nondisplaced fracture of the proximal fibular metaphysis. There is a nondisplaced fracture of the proximal tibial metaphysis. There is a moderate size joint effusion. There is severe medial compartment osteoarthritis with severe joint space narrowing with bone-on-bone appearance, subchondral sclerosis and marginal osteophytosis. There is patellofemoral compartment osteoarthritis. There is generalized osteopenia. There is soft tissue swelling around the proximal right lower leg.  Left knee: The left knee demonstrates no acute fracture or dislocation. There is no  significant joint effusion. The soft tissues are normal. There is no lytic or sclerotic osseous lesion. There is generalized osteopenia.  IMPRESSION: 1. Nondisplaced fractures of the proximal fibular metaphysis and proximal tibial metaphysis. 2. No acute osseous injury of the left knee. 3. Severe osteoarthritis of the medial femorotibial compartment of the right knee.   Electronically Signed   By: Kathreen Devoid   On: 11/30/2014 15:03     EKG Interpretation   Date/Time:  Friday November 30 2014 14:14:03 EDT Ventricular Rate:  120 PR Interval:  152 QRS Duration: 101 QT Interval:  344 QTC Calculation: 486 R Axis:   -64 Text Interpretation:  Sinus tachycardia Multiple premature supraventicular  complexes Left anterior fascicular block Consider RVH w/ secondary repol  abnormality ST depr, consider ischemia, anterolateral lds Artifact in  lead(s) I II III aVR aVL aVF V1 V2 V3 Abnormal ekg No old tracing to  compare Confirmed by Syble Picco  MD, Pelham Hennick ([id number]) on 11/30/2014 2:25:45 PM      MDM   Final diagnoses:  Trauma  Fracture of tibia, right, closed, initial encounter    The pt has some swelling of concern for possible fracture, but likely bruising - imaging ordered, no pain in the hips or UE's, no trauma to the trunk - lugns and heart without acute findings.  Pt in agreement with plan - she has baseline numbness in the legs so pain is difficult to gauge.  She declines pain meds  Etiology of fall likely generalized weakness and not cardiac in etiology  D/w Dr. Doran Durand - will admit - ortho tech - needs posterior long leg splint  Noemi Chapel, MD 11/30/14 1620

## 2014-11-30 NOTE — Progress Notes (Signed)
Orthopedic Tech Progress Note Patient Details:  Ashley Vaughan 1928/10/05 [id number]  Ortho Devices Type of Ortho Device: Ace wrap, Post (long leg) splint Ortho Device/Splint Location: RLE Ortho Device/Splint Interventions: Ordered, Application   Braulio Bosch 11/30/2014, 4:34 PM

## 2014-11-30 NOTE — ED Notes (Signed)
Per EMS, pt was walking with her walker and her legs gave out resulting in a fall. Pt was helped up to a chair by her son. Pt denies LOC. Pt has deformity to her RLE around the knee. Pts right foot is cooler than the left, but she reports this is baseline. Pulses intact distal to the injury.  Pt has hx of ALS and is on hospice care.

## 2014-11-30 NOTE — ED Notes (Signed)
[facility] (hospice) [phone]

## 2014-11-30 NOTE — H&P (Signed)
Ashley Vaughan is an 81 y.o. female.   Chief Complaint: right knee pain HPI: 81 y/o female with PMH of ALS fell early this afternoon landing on her knees bilaterally.  She c/o aching pain in the right proximal leg that is worse with any attempt at motion or WB.  It's mild and aching at rest.  She denies any h/o injury to the right LE in the past.  She was daignosed with ALS back in 2014 and has progressed significantly since then.  She ambulates with a walker but struggles due to upper extremity (particulary hand) weakness.  She is not a smoker.  She has no h/o diabetes.  She is currently a hospice patient at home.  Past Medical History  Diagnosis Date  . Hypertension   . Osteoporosis   . Migraine (atenolol for prophylaxis)  . Macular degeneration   . Kidney stones, calcium oxalate 1993  . Pulmonary hypertension mild,mild AR,mild-mod MR,mild-mod TR  . Hematuria, microscopic DrKimbrough 1993  . Hiatal hernia   . Diverticulosis (L sided) 1994  . Hyperlipidemia   . BCC (basal cell carcinoma of skin) L cheek-DrLupton (6/09)  . Postmenopausal bleeding 2010-DrCole  . Allergy     s/p immunotherapy  . Mitral regurgitation     mild-mod; mild aortic regurgitation, and mild-mod TR  . Atrophic vaginitis   . Hearing loss     right ear  . B12 deficiency   . ALS (amyotrophic lateral sclerosis)     Past Surgical History  Procedure Laterality Date  . Hemorroidectomy  1953  . Tonsillectomy and adenoidectomy  age 55    Family History  Problem Relation Age of Onset  . Cirrhosis Mother   . Cancer Father     ? they took out rotten intestines  . Cancer Brother     prostate  . Stroke Brother   . Stroke Brother 61  . Stroke Sister 33  . Breast cancer Sister 3    breast cancer  . Cancer Sister 22    breast cancer  . Heart disease Neg Hx   . Diabetes Neg Hx    Social History:  reports that she quit smoking about 36 years ago. She has never used smokeless tobacco. She reports that she does  not drink alcohol or use illicit drugs.  Allergies:  Allergies  Allergen Reactions  . Codeine Other (See Comments)    unknown  . Lexapro [Escitalopram] Nausea Only  . Macrobid [Nitrofurantoin Monohydrate Macrocrystals] Nausea Only    Nausea and headache  . Sulfa Antibiotics Other (See Comments)    dizziness     (Not in a hospital admission)  Results for orders placed or performed during the hospital encounter of 11/30/14 (from the past 48 hour(s))  Type and screen     Status: None   Collection Time: 11/30/14  3:04 PM  Result Value Ref Range   ABO/RH(D) O POS    Antibody Screen NEG    Sample Expiration 12/03/2014   CBC     Status: Abnormal   Collection Time: 11/30/14  3:28 PM  Result Value Ref Range   WBC 9.3 4.0 - 10.5 K/uL   RBC 3.76 (L) 3.87 - 5.11 MIL/uL   Hemoglobin 11.8 (L) 12.0 - 15.0 g/dL   HCT 35.6 (L) 36.0 - 46.0 %   MCV 94.7 78.0 - 100.0 fL   MCH 31.4 26.0 - 34.0 pg   MCHC 33.1 30.0 - 36.0 g/dL   RDW 13.1 11.5 - 15.5 %  Platelets 186 150 - 400 K/uL  I-stat chem 8, ed     Status: Abnormal   Collection Time: 11/30/14  3:41 PM  Result Value Ref Range   Sodium 139 135 - 145 mmol/L   Potassium 3.6 3.5 - 5.1 mmol/L   Chloride 102 101 - 111 mmol/L   BUN 15 6 - 20 mg/dL   Creatinine, Ser 0.70 0.44 - 1.00 mg/dL   Glucose, Bld 138 (H) 65 - 99 mg/dL   Calcium, Ion 1.11 (L) 1.13 - 1.30 mmol/L   TCO2 26 0 - 100 mmol/L   Hemoglobin 12.2 12.0 - 15.0 g/dL   HCT 36.0 36.0 - 46.0 %   Dg Knee Complete 4 Views Left  11/30/2014   CLINICAL DATA:  Left anterior knee pain and swelling.  EXAM: LEFT KNEE - COMPLETE 4+ VIEW; RIGHT KNEE - COMPLETE 4+ VIEW  COMPARISON:  None.  FINDINGS: Right knee: There is a nondisplaced fracture of the proximal fibular metaphysis. There is a nondisplaced fracture of the proximal tibial metaphysis. There is a moderate size joint effusion. There is severe medial compartment osteoarthritis with severe joint space narrowing with bone-on-bone  appearance, subchondral sclerosis and marginal osteophytosis. There is patellofemoral compartment osteoarthritis. There is generalized osteopenia. There is soft tissue swelling around the proximal right lower leg.  Left knee: The left knee demonstrates no acute fracture or dislocation. There is no significant joint effusion. The soft tissues are normal. There is no lytic or sclerotic osseous lesion. There is generalized osteopenia.  IMPRESSION: 1. Nondisplaced fractures of the proximal fibular metaphysis and proximal tibial metaphysis. 2. No acute osseous injury of the left knee. 3. Severe osteoarthritis of the medial femorotibial compartment of the right knee.   Electronically Signed   By: Kathreen Devoid   On: 11/30/2014 15:03   Dg Knee Complete 4 Views Right  11/30/2014   CLINICAL DATA:  Left anterior knee pain and swelling.  EXAM: LEFT KNEE - COMPLETE 4+ VIEW; RIGHT KNEE - COMPLETE 4+ VIEW  COMPARISON:  None.  FINDINGS: Right knee: There is a nondisplaced fracture of the proximal fibular metaphysis. There is a nondisplaced fracture of the proximal tibial metaphysis. There is a moderate size joint effusion. There is severe medial compartment osteoarthritis with severe joint space narrowing with bone-on-bone appearance, subchondral sclerosis and marginal osteophytosis. There is patellofemoral compartment osteoarthritis. There is generalized osteopenia. There is soft tissue swelling around the proximal right lower leg.  Left knee: The left knee demonstrates no acute fracture or dislocation. There is no significant joint effusion. The soft tissues are normal. There is no lytic or sclerotic osseous lesion. There is generalized osteopenia.  IMPRESSION: 1. Nondisplaced fractures of the proximal fibular metaphysis and proximal tibial metaphysis. 2. No acute osseous injury of the left knee. 3. Severe osteoarthritis of the medial femorotibial compartment of the right knee.   Electronically Signed   By: Kathreen Devoid   On:  11/30/2014 15:03    ROS no recent f/c/n/v/wt loss.  + progressive weakness of bilat UEs.  Blood pressure 128/79, pulse 110, temperature 97.9 F (36.6 C), temperature source Oral, resp. rate 18, SpO2 100 %. Physical Exam  Elderly female in nad.  A and ox 4.  Mood and affect normal.  EOMI.  resp unlabored.  B UEs with severe muscle wasting and stiffness of her hands.  R LE with swelling at the proximal tibia.  No skin lacerations.  2/5 strength in DF of the ankle and toes.  3/5 in  PF.  No lymphaenopathy.  Sens to LT diminshed about the feet bilat.  Assessment/Plan Right proximal tibia fracture - This non displaced fracture can likely be treated successfully in closed fashion.  We'll immobilize her in a long leg splint.  If her fracture displaces, she may still need surgical treatment.  I don't believe she can go home safely, so I'll admit her for PT, OT and SW consults.  She and her son understand this plan and agree.  Wylene Simmer 11/30/2014, 4:22 PM

## 2014-11-30 NOTE — Progress Notes (Signed)
Pt arrived to unit via stretcher with nurse tech from ED. Pt alert and oriented x4. VSS. No signs of respiratory distress. Pt has IV in forearm, saline locked. Care plans added. C/o some pain in leg and pharmacy working on verifying patient's meds. Family at bedside. Pt tibia wrapped with ace splint. Pt diet ordered. Will continue to monitor pt closely. Leanne Chang, RN

## 2014-12-01 MED ORDER — SODIUM CHLORIDE 0.9 % IV SOLN
Freq: Once | INTRAVENOUS | Status: AC
  Administered 2014-12-01: 16:00:00 via INTRAVENOUS

## 2014-12-01 MED ORDER — PANTOPRAZOLE SODIUM 40 MG PO TBEC
40.0000 mg | DELAYED_RELEASE_TABLET | Freq: Every day | ORAL | Status: DC
  Administered 2014-12-01 – 2014-12-03 (×3): 40 mg via ORAL
  Filled 2014-12-01 (×3): qty 1

## 2014-12-01 NOTE — Progress Notes (Signed)
OT Cancellation Note  Patient Details Name: Ashley Vaughan MRN: [id number] DOB: 1929-06-13   Cancelled Treatment:    Reason Eval/Treat Not Completed: Other (comment) (OT screened). Pt's current D/C plan is SNF. No apparent immediate acute care OT needs, therefore will defer OT to SNF. If OT eval is needed please call Acute Rehab Dept. at [phone] or text page OT at [phone].   Benito Mccreedy OTR/L [phone] 12/01/2014, 10:46 AM

## 2014-12-01 NOTE — Evaluation (Signed)
Physical Therapy Evaluation Patient Details Name: Ashley Vaughan MRN: [id number] DOB: 04-03-1929 Today's Date: 12/01/2014   History of Present Illness  Patient is a 81 y/o female with hx of ALS s/p R tibia fx after fall at home. Pt on hospice. PMH includes HTN, HLD, osteoporosis, mitral regurgitation.  Clinical Impression  Patient presents with pain, weakness secondary to ALS and new NWB status RLE impacting mobility. Pt with Rt tibia fx - no plans for surgery. Pt not safe to return home alone. Recommend ST SNF to maximize independence and mobility with possible transfer to ALF pending progress. Education provided on exercises. Emphasized OOB and elevation of RLE. Will continue to follow while in hospital.     Follow Up Recommendations SNF    Equipment Recommendations  None recommended by PT    Recommendations for Other Services       Precautions / Restrictions Precautions Precautions: Fall Restrictions Weight Bearing Restrictions: Yes RLE Weight Bearing: Non weight bearing      Mobility  Bed Mobility Overal bed mobility: Needs Assistance Bed Mobility: Supine to Sit     Supine to sit: HOB elevated;Mod assist     General bed mobility comments: Use of rail for support. Miod A to scoot bottom to EOB and bring RLE to EOB.  Transfers Overall transfer level: Needs assistance   Transfers: Stand Pivot Transfers   Stand pivot transfers: Max assist       General transfer comment: Max A SPT bed to [facility] and BSC to chair with cues for technique. Pt with difficulty keeping weight off RLE. Able to assist more when pushing off rails.   Ambulation/Gait                Stairs            Wheelchair Mobility    Modified Rankin (Stroke Patients Only)       Balance Overall balance assessment: Needs assistance Sitting-balance support: Feet supported;Bilateral upper extremity supported Sitting balance-Leahy Scale: Fair     Standing balance support: During  functional activity Standing balance-Leahy Scale: Zero Standing balance comment: Not able to stand without support.                              Pertinent Vitals/Pain Pain Assessment: Faces Faces Pain Scale: Hurts even more Pain Location: RLE Pain Descriptors / Indicators: Sore;Aching Pain Intervention(s): Limited activity within patient's tolerance;Monitored during session;Repositioned    Home Living Family/patient expects to be discharged to:: Skilled nursing facility Living Arrangements: Alone Available Help at Discharge: Family;Available PRN/intermittently Type of Home: House Home Access: Level entry     Home Layout: One level Home Equipment: Walker - 2 wheels;Bedside commode;Wheelchair - manual;Wheelchair - power;Grab bars - toilet      Prior Function Level of Independence: Needs assistance         Comments: Pt from home alone. Has hospice nurse and CNA come in 3 days/week. Family assists with ADLs and IADls. Pt uses RW for ambulation.      Hand Dominance        Extremity/Trunk Assessment   Upper Extremity Assessment: Defer to OT evaluation;Generalized weakness (Pt only able to use thumbs due to contractures in digits. not able to extend digits. )           Lower Extremity Assessment: Generalized weakness;RLE deficits/detail;LLE deficits/detail RLE Deficits / Details: Able to perform QS. NWB. Assisted with repositioning as pt with severe valgus at knee.  LLE Deficits / Details: Grossly ~3/5 throughout. Muscular atropy noted in BLEs.      Communication   Communication: HOH  Cognition Arousal/Alertness: Awake/alert Behavior During Therapy: WFL for tasks assessed/performed Overall Cognitive Status: Within Functional Limits for tasks assessed       Memory: Decreased short-term memory              General Comments General comments (skin integrity, edema, etc.): Pt's family present during session.    Exercises General Exercises - Lower  Extremity Quad Sets: Both;10 reps;Seated      Assessment/Plan    PT Assessment Patient needs continued PT services  PT Diagnosis Generalized weakness;Acute pain   PT Problem List Decreased strength;Pain;Impaired sensation;Decreased activity tolerance;Decreased balance;Decreased mobility  PT Treatment Interventions Balance training;Stair training;Functional mobility training;Therapeutic exercise;Therapeutic activities;Wheelchair mobility training;Patient/family education;Gait training   PT Goals (Current goals can be found in the Care Plan section) Acute Rehab PT Goals Patient Stated Goal: none stated PT Goal Formulation: With patient/family Time For Goal Achievement: 12/15/14 Potential to Achieve Goals: Good    Frequency Min 3X/week   Barriers to discharge Decreased caregiver support Pt lives alone    Co-evaluation               End of Session Equipment Utilized During Treatment: Gait belt Activity Tolerance: Patient tolerated treatment well;Patient limited by pain Patient left: in chair;with call bell/phone within reach;with family/visitor present Nurse Communication: Mobility status;Weight bearing status         Time: 7591-6384 PT Time Calculation (min) (ACUTE ONLY): 35 min   Charges:   PT Evaluation $Initial PT Evaluation Tier I: 1 Procedure PT Treatments $Therapeutic Activity: 8-22 mins   PT G Codes:        Somara Frymire A Jessalyn Hinojosa 12/01/2014, 9:33 AM Wray Kearns, PT, DPT [phone]

## 2014-12-01 NOTE — Progress Notes (Signed)
Ashley Vaughan [facility] 5N- Room 6-[facility] [place]-RN Visit-Stacie Delice Lesch RN, BSN  Related admission to [facility] diagnosis of amyotrophic lateral sclerosis.  Patinet has been with [facility] since May of this year, with a recent decline in functional status. She is a Full Code.  Patient stated she was ambulating to her kitchen when she tripped over the carpet and fell resulting in a Rt tibia fracture.  She called her son who called EMS immediately when he found her on the floor in pain.  [facility] was contacted after patient was already in route the hospital.   Patient seen in room with son and daughter-in-law at bedside. Discussed with family that [facility] would be involved with the hospital social worker in finding an appropriate discharge disposition.  They understand it is not safe for her to go home by herself with her ALS progressing and the increased number of falls she has been experiencing in her home.  Patient alert, oriented and appears comfortable sitting up in chair.  She stated the MD is still determining if surgery is needed.  She described her pain to the Rt lower extremity as throbbing when she gets up, however this has been controlled with 650mg  Tylenol.  She verbalized she would need her Vicodin before getting back to bed.  She also verbalized her difficulty with sleeping.  She feel like the 0.5mg  Ativan might be ineffective and is hoping for an increased dose, while in the hospital to ensure rest and comfort.  She is also requesting her 20mg  Omeprazole for reflux to be ordered while in the hospital.  This discussed with patient's primary RN,  Tranace.  [facility] transfer summary and medication list placed on chart.  [facility] will continue to follow.  Please feel free to call with any questions or concerns.  Annia Belt RN, [facility] Liaison [phone]

## 2014-12-01 NOTE — Progress Notes (Signed)
Subjective: Pt c/o mild pain in the proximal right leg.  Denies any problems o/w.   Objective: Vital signs in last 24 hours: Temp:  [97.6 F (36.4 C)-98.4 F (36.9 C)] 98.2 F (36.8 C) (06/18 0502) Pulse Rate:  [83-110] 88 (06/18 0502) Resp:  [14-18] 14 (06/18 0502) BP: (89-128)/(46-79) 95/66 mmHg (06/18 0502) SpO2:  [97 %-100 %] 98 % (06/18 0502)  Intake/Output from previous day:   Intake/Output this shift:     Recent Labs  11/30/14 1528 11/30/14 1541  HGB 11.8* 12.2    Recent Labs  11/30/14 1528 11/30/14 1541  WBC 9.3  --   RBC 3.76*  --   HCT 35.6* 36.0  PLT 186  --     Recent Labs  11/30/14 1541  NA 139  K 3.6  CL 102  BUN 15  CREATININE 0.70  GLUCOSE 138*   No results for input(s): LABPT, INR in the last 72 hours.  PE:  elderly woman in nad.  A and O x 4.  R LE immobilized in a long leg posterior splint.  NV exam unchanged from admission.  Assessment/Plan: R tibia fracture - continue NWB.  PT, OT, SW for d/c placement.   Wylene Simmer 12/01/2014, 7:15 AM

## 2014-12-02 NOTE — Clinical Social Work Placement (Signed)
   CLINICAL SOCIAL WORK PLACEMENT  NOTE  Date:  12/02/2014  Patient Details  Name: Ashley Vaughan MRN: [id number] Date of Birth: 1929-06-08  Clinical Social Work is seeking post-discharge placement for this patient at the [place] level of care (*CSW will initial, date and re-position this form in  chart as items are completed):  Yes   Patient/family provided with [place] Work Department's list of facilities offering this level of care within the geographic area requested by the patient (or if unable, by the patient's family).  Yes   Patient/family informed of their freedom to choose among providers that offer the needed level of care, that participate in Medicare, Medicaid or managed care program needed by the patient, have an available bed and are willing to accept the patient.  Yes   Patient/family informed of [facility]'s ownership interest in [facility] and [facility], as well as of the fact that they are under no obligation to receive care at these facilities.  PASRR submitted to EDS on 12/02/14     PASRR number received on 12/02/14     Existing PASRR number confirmed on       FL2 transmitted to all facilities in geographic area requested by pt/family on 12/02/14     FL2 transmitted to all facilities within larger geographic area on       Patient informed that his/her managed care company has contracts with or will negotiate with certain facilities, including the following:            Patient/family informed of bed offers received.  Patient chooses bed at       Physician recommends and patient chooses bed at      Patient to be transferred to   on  .  Patient to be transferred to facility by       Patient family notified on   of transfer.  Name of family member notified:        PHYSICIAN       Additional Comment:    _______________________________________________ Pete Pelt 12/02/2014, 10:13 PM

## 2014-12-02 NOTE — Progress Notes (Signed)
Flavia Shipper [facility] 5N- Room 6-[facility] [place]-RN [place] RN  Related admission to [facility] diagnosis of amyotrophic lateral sclerosis.Patient is a Full Code status.Patient sitting up in bed when this RN arrived. Dtr. and son in law at the bedside. Patient alert and oriented and reports ongoing pain in right leg. Right leg is propped on a pillow with an ice pack on top. Patient reports pain being a "10" 15 min. Ago and was just given a Vicodin. Family requesting Ativan dose at bedtime be increased. Staff nurse Irven Baltimore advised and she will contact Ortho MD for an order.  [facility] liaison discussed briefly with the family about the process of transferring to a SNF for rehab.  [facility] will continue to follow. Please feel free to call with any questions or concerns.  [facility] Liaison [phone]

## 2014-12-02 NOTE — Progress Notes (Signed)
Subjective:    R proximal tibia fx (non-operative). Long leg splint in place.  Patient reports pain as mild.  Well controlled. Noting some heartburn which she states is normal for her. Denies N/V/CP/SOB. No other c/o.  Objective: Vital signs in last 24 hours: Temp:  [98.3 F (36.8 C)-98.8 F (37.1 C)] 98.4 F (36.9 C) (06/19 0354) Pulse Rate:  [58-97] 94 (06/19 0354) Resp:  [16-17] 16 (06/19 0354) BP: (85-109)/(53-71) 109/71 mmHg (06/19 0354) SpO2:  [96 %-100 %] 97 % (06/19 0354)  Intake/Output from previous day: 06/18 0701 - 06/19 0700 In: 360 [P.O.:360] Out: 100 [Urine:100] Intake/Output this shift:     Recent Labs  11/30/14 1528 11/30/14 1541  HGB 11.8* 12.2    Recent Labs  11/30/14 1528 11/30/14 1541  WBC 9.3  --   RBC 3.76*  --   HCT 35.6* 36.0  PLT 186  --     Recent Labs  11/30/14 1541  NA 139  K 3.6  CL 102  BUN 15  CREATININE 0.70  GLUCOSE 138*   No results for input(s): LABPT, INR in the last 72 hours.  Neurologically intact ABD soft Neurovascular intact Sensation intact distally Intact pulses distally Dorsiflexion/Plantar flexion intact No cellulitis present Compartment soft no sign of DVT  Swelling R knee and lower leg No calf pain  Assessment/Plan:    R proximal tibia fx Advance diet Up with therapy strict NWB Ice and elevate R leg toes above the nose to help reduce swelling Remain in long leg splint RLE Awaiting SNF placement  Yuri Fana M. 12/02/2014, 9:08 AM

## 2014-12-02 NOTE — Clinical Social Work Note (Signed)
Clinical Social Work Assessment  Patient Details  Name: Ashley Vaughan MRN: [id number] Date of Birth: 1928-09-15  Date of referral:  12/02/14               Reason for consult:  Facility Placement, Discharge Planning                Permission sought to share information with:  Facility Sport and exercise psychologist, Family Supports Permission granted to share information::  Yes, Verbal Permission Granted  Name::     Mykaela Arena  Agency::  [place] area   Relationship::  Son  Contact Information:  [phone]  Housing/Transportation Living arrangements for the past 2 months:  [place] of Information:  Patient, Adult Children Patient Interpreter Needed:  None Criminal Activity/Legal Involvement Pertinent to Current Situation/Hospitalization:  No - Comment as needed Significant Relationships:  Adult Children, [place], [employer] Lives with:  Friends Do you feel safe going back to the place where you live?  No (Pt and family feel that Pt would better benefit with a SNF placement due to Pts ALS and declining functioning) Need for family participation in patient care:  Yes (Comment) (Son assists with all medical and placement decisions )  Care giving concerns:  Pt's son has concerns about the Pt living alone due to the fact that the Pt has been declining physically and is experiencing difficulty with mobilization. Pt would like to seek a long term placement for support.    Social Worker assessment / plan:  CSW met with the son and Pt at the bedside. CSW introduced self and reason for consult. Pt and family were aware of the consult and were waiting to speak with the CSW. Pts son stated Pt and Pt's son were wanting to seek longterm placement due to Pts limitation. Pt lives alone in the [facility] appts and is having increasing difficulty walking. Pt obtained her current injury while walking with her walker in the kitchen. Pt has been diagnosed with ALS and per son "has been  getting worse for sometime." Pt is aware that placement is necessary and is agreement for placement. Pt has good community support and family interaction. Pt did provide CSW permission to complete a search in the [employer] area. Weekday CSW will follow up with further d/c planning needs.   Employment status:  Disabled (Comment on whether or not currently receiving Disability), Retired Nurse, adult PT Recommendations:  [place] / Referral to community resources:  [place]  Patient/Family's Response to care:  Pt/Son was appreciative for walking them through the d/c planning process.   Patient/Family's Understanding of and Emotional Response to Diagnosis, Current Treatment, and Prognosis:  Pt is aware of her declining health due to ALS and has made the decision, with the help of her son, to seek Long term SNF placement.   Emotional Assessment Appearance:  Appears stated age Attitude/Demeanor/Rapport:    Affect (typically observed):  Accepting, Appropriate, Quiet, Pleasant, Stable Orientation:  Oriented to Self, Oriented to Place, Oriented to  Time, Oriented to Situation Alcohol / Substance use:  Never Used Psych involvement (Current and /or in the community):  No (Comment)  Discharge Needs  Concerns to be addressed:  Discharge Planning Concerns, Adjustment to Illness, Coping/Stress Concerns Readmission within the last 30 days:  No Current discharge risk:  Terminally ill (Pt has declining health related to ALS disease.) Barriers to Discharge:  No Barriers Identified   Pete Pelt 12/02/2014, 10:03 PM

## 2014-12-03 MED ORDER — ACETAMINOPHEN 325 MG PO TABS: 650.0000 mg | ORAL_TABLET | Freq: Four times a day (QID) | ORAL | Status: DC | PRN

## 2014-12-03 MED ORDER — HYDROCODONE-ACETAMINOPHEN 5-325 MG PO TABS: 1.0000 | ORAL_TABLET | ORAL | Status: DC | PRN

## 2014-12-03 NOTE — Clinical Social Work Note (Signed)
Clinical Social Worker facilitated patient discharge including contacting patient family and facility to confirm patient discharge plans.  Clinical information faxed to facility and family agreeable with plan.  CSW arranged ambulance transport via [facility] to [facility].  RN to call report prior to discharge.  DC packet prepared and on chart for transport with number for report.   Clinical Social Worker will sign off for now as social work intervention is no longer needed. Please consult [place] again if new need arises.  Glendon Axe, MSW, LCSWA [phone] 12/03/2014 2:30 PM

## 2014-12-03 NOTE — Clinical Social Work Placement (Signed)
   CLINICAL SOCIAL WORK PLACEMENT  NOTE  Date:  12/03/2014  Patient Details  Name: Ashley Vaughan MRN: [id number] Date of Birth: April 26, 1929  Clinical Social Work is seeking post-discharge placement for this patient at the [place] level of care (*CSW will initial, date and re-position this form in  chart as items are completed):  Yes   Patient/family provided with [place] Work Department's list of facilities offering this level of care within the geographic area requested by the patient (or if unable, by the patient's family).  Yes   Patient/family informed of their freedom to choose among providers that offer the needed level of care, that participate in Medicare, Medicaid or managed care program needed by the patient, have an available bed and are willing to accept the patient.  Yes   Patient/family informed of [facility]'s ownership interest in [facility] and [facility], as well as of the fact that they are under no obligation to receive care at these facilities.  PASRR submitted to EDS on 12/02/14     PASRR number received on 12/02/14     Existing PASRR number confirmed on       FL2 transmitted to all facilities in geographic area requested by pt/family on 12/02/14     FL2 transmitted to all facilities within larger geographic area on       Patient informed that his/her managed care company has contracts with or will negotiate with certain facilities, including the following:   (YES )     Yes   Patient/family informed of bed offers received.  Patient chooses bed at  [facility])     Physician recommends and patient chooses bed at      Patient to be transferred to  [facility]) on 12/03/14.  Patient to be transferred to facility by  [facility]y [facility] )     Patient family notified on 12/03/14 of transfer.  Name of family member notified:   (Pt's son, [name])     PHYSICIAN Please sign FL2      Additional Comment:    _______________________________________________ Rozell Searing, LCSW 12/03/2014, 2:29 PM

## 2014-12-03 NOTE — Progress Notes (Signed)
Ashley Vaughan [facility] 5N- Room 6-[facility] -[facility]-RN Visit-Stacie Delice Lesch RN, BSN  Related admission to [facility] diagnosis of amyotrophic lateral sclerosis. Patient is a Full Code.  Patient seen in room with daughter-in-law at bedside.  Patient alert, oriented x3 with pleasant affect.  Patient reports pain of 8/10 and stated she just received 1 Vicodin 5/325mg  PO.  Family verbalized hospital social worker is currently working on a skilled nursing bed placement.  Patient and family deny any questions/concerns.  [facility] will continue to follow. Please feel free to call with any questions.  Annia Belt RN, [facility] Liaison [phone]

## 2014-12-03 NOTE — Progress Notes (Signed)
[facility] [place] ([facility] Sw note:   Sw met with Pt/family; son and dtr-in-law at bedside; Pt was getting bathed by aide during Sw visit. They reported hospital CSW is seeking nursing home placement for Pt for rehab and also long term placement. Family stated that this was the best plan for Pt as she remains alone at home and her disease is progressing. Sw provided education about using Medicare to pay for nursing home placement which would necessitate DC from [facility] to have access to Medicare funds to pay for bed at nursing home. Family wishes to revoke [facility] services to have access to Medicare funds to pay for nursing home, skilled nursing bed. This revocation will go into effect the day Pt goes to the nursing home.  This Sw will continue to follow for support during this hospital stay.  [place], [place]

## 2014-12-03 NOTE — Progress Notes (Signed)
Called report to [facility] SNF Nurse Supervisor Genice Rouge, RN.

## 2014-12-03 NOTE — Progress Notes (Signed)
Physical Therapy Treatment Patient Details Name: Ashley Vaughan MRN: [id number] DOB: 04/22/1929 Today's Date: 12/03/2014    History of Present Illness Patient is a 81 y/o female with hx of ALS s/p R tibia fx after fall at home. Pt on hospice. PMH includes HTN, HLD, osteoporosis, mitral regurgitation.    PT Comments    Patient progressing slowly with PT goals. Performed there ex sitting EOB. Requires total A to transfer to chair due to not being OOB all weekend and deconditioning. Recommend speech consult as pt complaining of aspiration and coughing with thin liquids. Encouraged OOB for all meals. Will continue to follow to maximize independence and mobility.   Follow Up Recommendations  SNF     Equipment Recommendations  None recommended by PT    Recommendations for Other Services Speech consult     Precautions / Restrictions Precautions Precautions: Fall Restrictions Weight Bearing Restrictions: Yes RLE Weight Bearing: Non weight bearing    Mobility  Bed Mobility Overal bed mobility: Needs Assistance Bed Mobility: Supine to Sit     Supine to sit: Mod assist;HOB elevated     General bed mobility comments: Use of rail for support. Miod A to scoot bottom to EOB and bring RLE to EOB.  Transfers Overall transfer level: Needs assistance   Transfers: Stand Pivot Transfers   Stand pivot transfers: Total assist       General transfer comment: Total A SPT bed to chair with cues for technique. Pt with difficulty keeping weight off RLE.   Ambulation/Gait                 Stairs            Wheelchair Mobility    Modified Rankin (Stroke Patients Only)       Balance Overall balance assessment: Needs assistance Sitting-balance support: Feet supported;Bilateral upper extremity supported Sitting balance-Leahy Scale: Poor Sitting balance - Comments: requires external support during there ex sitting EOB with posterior LOB. Postural control: Posterior  lean Standing balance support: During functional activity Standing balance-Leahy Scale: Zero                      Cognition Arousal/Alertness: Awake/alert Behavior During Therapy: WFL for tasks assessed/performed Overall Cognitive Status: Within Functional Limits for tasks assessed                      Exercises General Exercises - Lower Extremity Quad Sets: Both;10 reps;Seated Long Arc Quad: Left;15 reps;AAROM;Right Hip Flexion/Marching: Both;15 reps;Seated Other Exercises Other Exercises: Scooting along side bed to left with emphasis on anterior weight shift and clearing bottom to scoot to the left x5.  Other Exercises: clearing bottom in chair to assist with pressure relieve x10 Mod A.    General Comments        Pertinent Vitals/Pain Pain Assessment: Faces Faces Pain Scale: Hurts little more Pain Location: RLe with movement Pain Descriptors / Indicators: Sore;Aching Pain Intervention(s): Limited activity within patient's tolerance;Monitored during session;Repositioned    Home Living                      Prior Function            PT Goals (current goals can now be found in the care plan section) Progress towards PT goals: Progressing toward goals    Frequency  Min 3X/week    PT Plan Current plan remains appropriate    Co-evaluation  End of Session Equipment Utilized During Treatment: Gait belt Activity Tolerance: Patient tolerated treatment well Patient left: in chair;with family/visitor present;with call bell/phone within reach     Time: 1052-1120 PT Time Calculation (min) (ACUTE ONLY): 28 min  Charges:  $Therapeutic Exercise: 8-22 mins $Therapeutic Activity: 8-22 mins                    G Codes:      Betsabe Iglesia A Kendale Rembold 12/03/2014, 12:06 PM [place], [place], DPT [phone]

## 2014-12-03 NOTE — Progress Notes (Signed)
Occupational Therapy Evaluation Patient Details Name: Ashley Vaughan MRN: [id number] DOB: March 16, 1929 Today's Date: 12/03/2014    History of Present Illness Patient is a 81 y/o female with hx of ALS s/p R tibia fx after fall at home. Pt on hospice. PMH includes HTN, HLD, osteoporosis, mitral regurgitation.   Clinical Impression   PTA, pt lived at home  And had assist with ADL and was mod I @ RW level. Family reports pt needing increased assistance over the last 3-4 months. Pt will benefit from skilled OT services at SNF. Pt given B palm guards to prevent further hand contractures and maintain hand hygiene. Recommend Speech/swallow assessment.    Follow Up Recommendations  SNF;Supervision/Assistance - 24 hour    Equipment Recommendations  Other (comment) (TBA at [facility])  [facility], OTR/L  [phone] 12/03/2014  Recommendations for Other Services  speech/swallow assessment.     Precautions / Restrictions Precautions Precautions: Fall Restrictions Weight Bearing Restrictions: Yes RLE Weight Bearing: Non weight bearing      Mobility Bed Mobility Overal bed mobility: Needs Assistance Bed Mobility: Supine to Sit;Sit to Supine     Supine to sit: Mod assist Sit to supine: Mod assist   General bed mobility comments: using rail to assist  Transfers Overall transfer level: Needs assistance   Transfers: Stand Pivot Transfers   Stand pivot transfers: Total assist       General transfer comment: Total A SPT bed to chair with cues for technique. Pt with difficulty keeping weight off RLE.     Balance Overall balance assessment: Needs assistance Sitting-balance support: Feet supported;Bilateral upper extremity supported Sitting balance-Leahy Scale: Fair Sitting balance - Comments: requires external support during there ex sitting EOB with posterior LOB. Postural control: Posterior lean Standing balance support: During functional activity Standing balance-Leahy Scale: Zero                               ADL                                         General ADL Comments: Pt reports increased need for assist with ADL within past 3-4 months. Pt able to assist with UB bathing. Max A with other ADL tasks.  Family reports increased problems with swallowing. Nsg aware.                      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 Unable to fully extend. Able to extend PIP DIP @ 1 in from distal palmer crease B. Pt states she is able to hold a utensil but has become more difficutl  Pt issued B palm guards. Pt/family educated on use of palm guards and donning/doffing palm guards. REc pt to wear guards at night.Pt verbalized understanding.    Lower Extremity Assessment Lower Extremity Assessment: Defer to PT evaluation   Cervical / Trunk Assessment Cervical / Trunk Assessment: Normal   Communication Communication Communication: HOH   Cognition Arousal/Alertness: Awake/alert Behavior During Therapy: WFL for tasks  assessed/performed Overall Cognitive Status: Within Functional Limits for tasks assessed                     General Comments       Exercises   Other Exercises Other Exercises: Scooting along side bed to left with emphasis on anterior weight shift and clearing bottom to scoot to the left x5.  Other Exercises: clearing bottom in chair to assist with pressure relieve x10 Mod A.   Shoulder Instructions      Home Living Family/patient expects to be discharged to::  Skilled nursing facility Living Arrangements: Alone Available Help at Discharge: Family;Available PRN/intermittently Type of Home: House Home Access: Level entry                                Prior Functioning/Environment Level of Independence: Needs assistance        Comments: Pt from home alone. Has hospice nurse and CNA come in 3 days/week. Family assists with ADLs and IADls. Pt uses RW for ambulation.     OT Diagnosis: Generalized weakness;Acute pain   OT Problem List: Decreased strength;Decreased range of motion;Decreased activity tolerance;Impaired balance (sitting and/or standing);Decreased coordination;Decreased knowledge of use of DME or AE;Impaired tone;Impaired UE functional use;Pain   OT Treatment/Interventions:      OT Goals(Current goals can be found in the care plan section) Acute Rehab OT Goals Patient Stated Goal: to do what I can OT Goal Formulation:  (eval only)  OT Frequency:     Barriers to D/C:            Co-evaluation              End of Session Nurse Communication: Mobility status;Other (comment) (palm guards)  Activity Tolerance: Patient tolerated treatment well Patient left: in bed;with call bell/phone within reach;with family/visitor present   Time: 1410-1433 OT Time Calculation (min): 23 min Charges:  OT General Charges $OT Visit: 1 Procedure OT Evaluation $Initial OT Evaluation Tier I: 1 Procedure OT Treatments $Therapeutic Activity: 8-22 mins G-Codes:    [name],[name] 31-Dec-2014, 3:51 PM

## 2014-12-03 NOTE — Discharge Instructions (Signed)
Ashley Simmer, MD [place]  Please read the following information regarding your care after surgery.  Medications  You only need a prescription for the narcotic pain medicine (ex. oxycodone, Percocet, Norco).  All of the other medicines listed below are available over the counter. X acetominophen (Tylenol) 650 mg every 4-6 hours as you need for minor pain X Norco as prescribed for moderate to severe pain  X To help prevent blood clots, take an aspirin once a day for a month after surgery.  You should also get up every hour while you are awake to move around.    Weight Bearing ? Bear weight when you are able on your operated leg or foot. ? Bear weight only on the heel of your operated foot in the post-op shoe. X Do not bear any weight on the operated leg or foot.  Cast / Splint / Dressing X Keep your splint or cast clean and dry.  Dont put anything (coat hanger, pencil, etc) down inside of it.  If it gets damp, use a hair dryer on the cool setting to dry it.  If it gets soaked, call the office to schedule an appointment for a cast change. ? Remove your dressing 3 days after surgery and cover the incisions with dry dressings.    After your dressing, cast or splint is removed; you may shower, but do not soak or scrub the wound.  Allow the water to run over it, and then gently pat it dry.  Swelling It is normal for you to have swelling where you had surgery.  To reduce swelling and pain, keep your toes above your nose for at least 3 days after surgery.  It may be necessary to keep your foot or leg elevated for several weeks.  If it hurts, it should be elevated.  Follow Up Call my office at [phone] when you are discharged from the hospital or surgery center to schedule an appointment to be seen two weeks after surgery.  Call my office at [phone] if you develop a fever >101.5 F, nausea, vomiting, bleeding from the surgical site or severe pain.

## 2014-12-03 NOTE — Discharge Summary (Signed)
Physician Discharge Summary  Patient ID: Ashley Vaughan MRN: [id number] DOB/AGE: 79-Sep-1930 79 y.o.  Admit date: 11/30/2014 Discharge date: 12/03/2014  Admission Diagnoses:  Right tibia fracture, left knee constusion, ALS, htn, GERD  Discharge Diagnoses:  Active Problems:   Closed right tibial fracture same as above  Discharged Condition: stable  Hospital Course: The patient was admitted on 6/17 for a right non displaced tibia fracture.  She was splinted and has done well on 5N.  She has a h/o ALS which is stable but gradually progressive.  SHe is unable to use a walker to maintain NWB on the R LE.  She requires SNF placement while she recovers from this injury.  She is discharged to SNF today in stable condition to continue PT and OT and should remain NWB on the R LE.  Consults: PT, OT, SW  Significant Diagnostic Studies: none  Treatments: splinting of the fx  Discharge Exam: Blood pressure 119/64, pulse 98, temperature 98.6 F (37 C), temperature source Oral, resp. rate 16, height 5' (1.524 m), weight 44 kg (97 lb), SpO2 95 %. R LE splinted.  Sens to LT diminshed at the foot.  5/5 strength in PF and DF of the toes and ankel.  No gross deformity at the proximal tibia.  Disposition: to SNF.  Discharge Instructions    Call MD / Call 911    Complete by:  As directed   If you experience chest pain or shortness of breath, CALL 911 and be transported to the hospital emergency room.  If you develope a fever above 101 F, pus (white drainage) or increased drainage or redness at the wound, or calf pain, call your surgeon's office.     Constipation Prevention    Complete by:  As directed   Drink plenty of fluids.  Prune juice may be helpful.  You may use a stool softener, such as Colace (over the counter) 100 mg twice a day.  Use MiraLax (over the counter) for constipation as needed.     Diet - low sodium heart healthy    Complete by:  As directed      Increase activity slowly as  tolerated    Complete by:  As directed      Non weight bearing    Complete by:  As directed   Laterality:  right  Extremity:  Lower            Medication List    TAKE these medications        acetaminophen 325 MG tablet  Commonly known as:  TYLENOL  Take 2 tablets (650 mg total) by mouth every 6 (six) hours as needed for mild pain (or Temp > 100).     amLODipine 5 MG tablet  Commonly known as:  NORVASC  Take 1 tablet (5 mg total) by mouth daily.     aspirin 81 MG tablet  Take 81 mg by mouth every evening.     B-12 5000 MCG Subl  Place 1 tablet under the tongue daily.     HYDROcodone-acetaminophen 5-325 MG per tablet  Commonly known as:  NORCO/VICODIN  Take 1 tablet by mouth every 4 (four) hours as needed for moderate pain.     LORazepam 0.5 MG tablet  Commonly known as:  ATIVAN  Take 0.5 mg by mouth at bedtime.     multivitamin with minerals tablet  Take 1 tablet by mouth daily.     OCUVITE PO  Take 2 tablets by mouth daily.  omeprazole 20 MG capsule  Commonly known as:  PRILOSEC  Take 20 mg by mouth daily.     PREMARIN vaginal cream  Generic drug:  conjugated estrogens  APPLY 1/2 GRAM VAGINALLY TWICE WEEKLY           Follow-up Information    Follow up with KNAPP,EVE A, MD.   Specialty:  Family Medicine   Contact information:   [address] [place] [address] [phone]       Follow up with Wylene Simmer, MD. Schedule an appointment as soon as possible for a visit in 1 week.   Specialty:  Orthopedic Surgery   Contact information:   [address] [place] [address] [phone]       Signed: Wylene Simmer 12/03/2014, 1:14 PM

## 2014-12-04 ENCOUNTER — Encounter: Payer: Self-pay | Admitting: Adult Health

## 2014-12-04 ENCOUNTER — Non-Acute Institutional Stay (SKILLED_NURSING_FACILITY): Payer: Medicare Other | Admitting: Adult Health

## 2014-12-04 DIAGNOSIS — G1221 Amyotrophic lateral sclerosis: Secondary | ICD-10-CM

## 2014-12-04 DIAGNOSIS — E538 Deficiency of other specified B group vitamins: Secondary | ICD-10-CM

## 2014-12-04 DIAGNOSIS — F419 Anxiety disorder, unspecified: Secondary | ICD-10-CM | POA: Diagnosis not present

## 2014-12-04 DIAGNOSIS — K219 Gastro-esophageal reflux disease without esophagitis: Secondary | ICD-10-CM

## 2014-12-04 DIAGNOSIS — S82291S Other fracture of shaft of right tibia, sequela: Secondary | ICD-10-CM

## 2014-12-04 DIAGNOSIS — I10 Essential (primary) hypertension: Secondary | ICD-10-CM | POA: Diagnosis not present

## 2014-12-04 DIAGNOSIS — Z79890 Hormone replacement therapy (postmenopausal): Secondary | ICD-10-CM

## 2014-12-04 NOTE — Progress Notes (Signed)
Patient ID: Ashley Vaughan, female   DOB: Nov 11, 1928, 81 y.o.   MRN: [id number]   12/04/2014  Facility:  Nursing Home Location:  [facility] Room Number: [phone] LEVEL OF CARE:  SNF (31)   Chief Complaint  Patient presents with  . Hospitalization Follow-up    Right tibia fracture, hypertension, vitamin B12 deficiency, anxiety, GERD and HRT    HISTORY OF PRESENT ILLNESS:  This is an 81 year old female who has been admitted to [facility] on 12/03/14 from [facility]. She has PMH of hypertension, osteoporosis, migraine, hyperlipidemia, ALS, vitamin B12 deficiency, macular degeneration and right hearing loss. She had a fall landing on her bilateral knees. She was diagnosed with ALS in 2014 and has progressed since then. She is currently on hospice at home. Orthopedics was consulted and recommended nonsurgical intervention. RLE will be immobilized using long-leg splint and will have nonweightbearing.  She has been admitted for a short-term rehabilitation.  PAST MEDICAL HISTORY:  Past Medical History  Diagnosis Date  . Hypertension   . Osteoporosis   . Migraine (atenolol for prophylaxis)  . Macular degeneration   . Kidney stones, calcium oxalate 1993  . Pulmonary hypertension mild,mild AR,mild-mod MR,mild-mod TR  . Hematuria, microscopic DrKimbrough 1993  . Hiatal hernia   . Diverticulosis (L sided) 1994  . Hyperlipidemia   . BCC (basal cell carcinoma of skin) L cheek-DrLupton (6/09)  . Postmenopausal bleeding 2010-DrCole  . Allergy     s/p immunotherapy  . Mitral regurgitation     mild-mod; mild aortic regurgitation, and mild-mod TR  . Atrophic vaginitis   . Hearing loss     right ear  . B12 deficiency   . ALS (amyotrophic lateral sclerosis)     CURRENT MEDICATIONS: Reviewed per MAR/see medication list  Allergies  Allergen Reactions  . Codeine Other (See Comments)    unknown  . Lexapro [Escitalopram] Nausea Only  . Macrobid  [Nitrofurantoin Monohydrate Macrocrystals] Nausea Only    Nausea and headache  . Sulfa Antibiotics Other (See Comments)    dizziness     REVIEW OF SYSTEMS:  GENERAL: no change in appetite, no fatigue, no weight changes, no fever, chills or weakness RESPIRATORY: no cough, SOB, DOE, wheezing, hemoptysis CARDIAC: no chest pain, edema or palpitations GI: no abdominal pain, diarrhea, constipation, heart burn, nausea or vomiting  PHYSICAL EXAMINATION  GENERAL: no acute distress, normal body habitus EYES: conjunctivae normal, sclerae normal, normal eye lids NECK: supple, trachea midline, no neck masses, no thyroid tenderness, no thyromegaly LYMPHATICS: no LAN in the neck, no supraclavicular LAN RESPIRATORY: breathing is even & unlabored, BS CTAB CARDIAC: RRR, no murmur,no extra heart sounds, no edema GI: abdomen soft, normal BS, no masses, no tenderness, no hepatomegaly, no splenomegaly EXTREMITIES:  Able to move 4 extremities; long leg splint on RLE PSYCHIATRIC: the patient is alert & oriented to person, affect & behavior appropriate  LABS/RADIOLOGY:  Dg Knee Complete 4 Views Left  11/30/2014   CLINICAL DATA:  Left anterior knee pain and swelling.  EXAM: LEFT KNEE - COMPLETE 4+ VIEW; RIGHT KNEE - COMPLETE 4+ VIEW  COMPARISON:  None.  FINDINGS: Right knee: There is a nondisplaced fracture of the proximal fibular metaphysis. There is a nondisplaced fracture of the proximal tibial metaphysis. There is a moderate size joint effusion. There is severe medial compartment osteoarthritis with severe joint space narrowing with bone-on-bone appearance, subchondral sclerosis and marginal osteophytosis. There is patellofemoral compartment osteoarthritis. There is generalized osteopenia. There  is soft tissue swelling around the proximal right lower leg.  Left knee: The left knee demonstrates no acute fracture or dislocation. There is no significant joint effusion. The soft tissues are normal. There is no  lytic or sclerotic osseous lesion. There is generalized osteopenia.  IMPRESSION: 1. Nondisplaced fractures of the proximal fibular metaphysis and proximal tibial metaphysis. 2. No acute osseous injury of the left knee. 3. Severe osteoarthritis of the medial femorotibial compartment of the right knee.   Electronically Signed   By: Kathreen Devoid   On: 11/30/2014 15:03   Dg Knee Complete 4 Views Right  11/30/2014   CLINICAL DATA:  Left anterior knee pain and swelling.  EXAM: LEFT KNEE - COMPLETE 4+ VIEW; RIGHT KNEE - COMPLETE 4+ VIEW  COMPARISON:  None.  FINDINGS: Right knee: There is a nondisplaced fracture of the proximal fibular metaphysis. There is a nondisplaced fracture of the proximal tibial metaphysis. There is a moderate size joint effusion. There is severe medial compartment osteoarthritis with severe joint space narrowing with bone-on-bone appearance, subchondral sclerosis and marginal osteophytosis. There is patellofemoral compartment osteoarthritis. There is generalized osteopenia. There is soft tissue swelling around the proximal right lower leg.  Left knee: The left knee demonstrates no acute fracture or dislocation. There is no significant joint effusion. The soft tissues are normal. There is no lytic or sclerotic osseous lesion. There is generalized osteopenia.  IMPRESSION: 1. Nondisplaced fractures of the proximal fibular metaphysis and proximal tibial metaphysis. 2. No acute osseous injury of the left knee. 3. Severe osteoarthritis of the medial femorotibial compartment of the right knee.   Electronically Signed   By: Kathreen Devoid   On: 11/30/2014 15:03    ASSESSMENT/PLAN:  Closed right tibia  Fracture -  continue immobilization with long-leg splint; nonweightbearing; continue Norco 5/325 mg 1 tab by mouth every 4 hours when necessary for pain; and follow-up with Dr. Doran Durand, orthopedic surgeon, in 1 week; for rehabilitation   ALS  - for rehabilitation  Hypertension  - continue Norvasc 5 mg 1  tab by mouth daily   Vitamin B12 deficiency - continue vitamin B12 5000 g 1 tab SL Q D Anxiety - mood is stable; continue Ativan 0.5 mg 1 tab by mouth daily at bedtime GERD - continue Prilosec 20 mg 1 capsule by mouth daily HRT - continue Premarin vaginal cream apply 1/2 gm vaginally twice weekly    Goals of care:  Short-term rehabilitation   Labs/test ordered:  CBC and BMP in 1 week     [facility], NP [place] [phone]

## 2014-12-05 ENCOUNTER — Non-Acute Institutional Stay (SKILLED_NURSING_FACILITY): Payer: Medicare Other | Admitting: Internal Medicine

## 2014-12-05 ENCOUNTER — Telehealth: Payer: Self-pay | Admitting: Family Medicine

## 2014-12-05 DIAGNOSIS — S82201S Unspecified fracture of shaft of right tibia, sequela: Secondary | ICD-10-CM | POA: Diagnosis not present

## 2014-12-05 DIAGNOSIS — K219 Gastro-esophageal reflux disease without esophagitis: Secondary | ICD-10-CM

## 2014-12-05 DIAGNOSIS — R1310 Dysphagia, unspecified: Secondary | ICD-10-CM | POA: Diagnosis not present

## 2014-12-05 DIAGNOSIS — I10 Essential (primary) hypertension: Secondary | ICD-10-CM | POA: Diagnosis not present

## 2014-12-05 DIAGNOSIS — G1221 Amyotrophic lateral sclerosis: Secondary | ICD-10-CM | POA: Diagnosis not present

## 2014-12-05 DIAGNOSIS — F411 Generalized anxiety disorder: Secondary | ICD-10-CM | POA: Diagnosis not present

## 2014-12-05 NOTE — Telephone Encounter (Signed)
kpn report

## 2014-12-05 NOTE — Progress Notes (Signed)
Patient ID: Ashley Vaughan, female   DOB: [birth year], 81 y.o.   MRN: [id number]     [facility]   PCP: KNAPP,EVE A, MD  Code Status: dnr  Allergies  Allergen Reactions  . Codeine Other (See Comments)    unknown  . Lexapro [Escitalopram] Nausea Only  . Macrobid [Nitrofurantoin Monohydrate Macrocrystals] Nausea Only    Nausea and headache  . Sulfa Antibiotics Other (See Comments)    dizziness    Chief Complaint  Patient presents with  . New Admit To SNF     HPI:  81 year old patient is here for short term rehabilitation post hospital admission from 11/30/14-12/03/14 with nondisplaced right tibia fracture. She had a splint placed and is non weight bearing in her right leg. She has PMH of ALS, HTN and GERD and due to her physical limitation from ALS, she has been sent to SNF for rehabilitation. She is seen in her room today. Her pain is controlled with current pain regimen. She complaints of difficulty swallowing water, denies any concerns with swallowing of solid food. She denies any other concerns. She has been working with PT and OT team.  Review of Systems:  Constitutional: Negative for fever, chills, diaphoresis.  HENT: Negative for headache, congestion, nasal discharge Eyes: Negative for eye pain, blurred vision, double vision and discharge.  Respiratory: Negative for cough, shortness of breath and wheezing.   Cardiovascular: Negative for chest pain, palpitations, leg swelling.  Gastrointestinal: Negative for nausea, vomiting, abdominal pain. Has occasional heartburn. Had bowel movement this am Genitourinary: Negative for dysuria Musculoskeletal: Negative for back pain, falls. on wheelchair.  Skin: Negative for itching, rash.  Neurological: Negative for dizziness, tingling, focal weakness Psychiatric/Behavioral: Negative for depression   Past Medical History  Diagnosis Date  . Hypertension   . Osteoporosis   . Migraine (atenolol for  prophylaxis)  . Macular degeneration   . Kidney stones, calcium oxalate 1993  . Pulmonary hypertension mild,mild AR,mild-mod MR,mild-mod TR  . Hematuria, microscopic DrKimbrough 1993  . Hiatal hernia   . Diverticulosis (L sided) 1994  . Hyperlipidemia   . BCC (basal cell carcinoma of skin) L cheek-DrLupton (6/09)  . Postmenopausal bleeding 2010-DrCole  . Allergy     s/p immunotherapy  . Mitral regurgitation     mild-mod; mild aortic regurgitation, and mild-mod TR  . Atrophic vaginitis   . Hearing loss     right ear  . B12 deficiency   . ALS (amyotrophic lateral sclerosis)    Past Surgical History  Procedure Laterality Date  . Hemorroidectomy  1953  . Tonsillectomy and adenoidectomy  age 80   Social History:   reports that she quit smoking about 36 years ago. She has never used smokeless tobacco. She reports that she does not drink alcohol or use illicit drugs.  Family History  Problem Relation Age of Onset  . Cirrhosis Mother   . Cancer Father     ? they took out rotten intestines  . Cancer Brother     prostate  . Stroke Brother   . Stroke Brother 42  . Stroke Sister 42  . Breast cancer Sister 39    breast cancer  . Cancer Sister 75    breast cancer  . Heart disease Neg Hx   . Diabetes Neg Hx     Medications: Patient's Medications  New Prescriptions   No medications on file  Previous Medications   ACETAMINOPHEN (TYLENOL) 325 MG TABLET  Take 2 tablets (650 mg total) by mouth every 6 (six) hours as needed for mild pain (or Temp > 100).   AMLODIPINE (NORVASC) 5 MG TABLET    Take 1 tablet (5 mg total) by mouth daily.   ASPIRIN 81 MG TABLET    Take 81 mg by mouth every evening.    CYANOCOBALAMIN (B-12) 5000 MCG SUBL    Place 1 tablet under the tongue daily.   HYDROCODONE-ACETAMINOPHEN (NORCO/VICODIN) 5-325 MG PER TABLET    Take 1 tablet by mouth every 4 (four) hours as needed for moderate pain.   LORAZEPAM (ATIVAN) 0.5 MG TABLET    Take 0.5 mg by mouth at bedtime.     MULTIPLE VITAMINS-MINERALS (MULTIVITAMIN WITH MINERALS) TABLET    Take 1 tablet by mouth daily.     MULTIPLE VITAMINS-MINERALS (OCUVITE PO)    Take 2 tablets by mouth daily.     OMEPRAZOLE (PRILOSEC) 20 MG CAPSULE    Take 20 mg by mouth daily.   PREMARIN VAGINAL CREAM    APPLY 1/2 GRAM VAGINALLY TWICE WEEKLY  Modified Medications   No medications on file  Discontinued Medications   No medications on file     Physical Exam: Filed Vitals:   12/05/14 1000  BP: 125/89  Pulse: 88  Temp: 97.7 F (36.5 C)  Resp: 18  SpO2: 96%    General- elderly female, thin built, in no acute distress Head- normocephalic, atraumatic Throat- moist mucus membrane Neck- no cervical lymphadenopathy, no jugular vein distension Respiratory- bilateral clear to auscultation, no wheeze, no rhonchi, no crackles, no use of accessory muscles Abdomen- bowel sounds present, soft, non tender Musculoskeletal- able to move all 4 extremities, both hands have contracture of fingers from ALS, right leg has a long splint, able to move her toes, good capillary refill Neurological- no focal deficit Skin- warm and dry Psychiatry- alert and oriented    Labs reviewed: Basic Metabolic Panel:  Recent Labs  03/06/14 0854 08/02/14 1533 11/30/14 1541  NA 141 141 139  K 4.1 3.6 3.6  CL 105 103 102  CO2 27 26  --   GLUCOSE 92 110* 138*  BUN 12 10 15   CREATININE 0.64 0.58 0.70  CALCIUM 9.4 9.7  --    Liver Function Tests:  Recent Labs  03/06/14 0854 08/02/14 1533  AST 15 13  ALT 10 9  ALKPHOS 47 45  BILITOT 0.6 0.5  PROT 6.4 6.4  ALBUMIN 4.2 4.1   No results for input(s): LIPASE, AMYLASE in the last 8760 hours. No results for input(s): AMMONIA in the last 8760 hours. CBC:  Recent Labs  12/14/13 0842 03/06/14 0854 08/02/14 1533 11/30/14 1528 11/30/14 1541  WBC 6.5 7.0 6.6 9.3  --   NEUTROABS 4.3 4.6 4.2  --   --   HGB 11.3* 11.7* 12.2 11.8* 12.2  HCT 33.7* 34.8* 36.9 35.6* 36.0  MCV 94.1 92.8  95.6 94.7  --   PLT 183 184 246 186  --     Assessment/Plan  Closed right tibia fracture S/p splint placement. NWB to right leg. Will have patient work with PT/OT as tolerated to regain strength and restore function.  Fall precautions are in place. Continue Norco 5/325 mg q4h prn pain. Has follow up with orthopedic. Continue baby aspirin for dvt prophylaxis   diffculty swallowing Of liquids only. Will have SLP evaluate further. Likely from her ALS, will get barium swallow study if needed  ALS  Progressive, monitor clinically, assistance with ADL as  needed, to work with therapy team to help restore her function  Hypertension  Stable, continue Norvasc 5 mg daily  gerd Stable, continue prilosec 20 mg daily  Vitamin B12 deficiency continue vitamin B12 supplement  GAD Stable, continue Ativan 0.5 mg daily   Goals of care: short term rehabilitation   Labs/tests ordered: none  Family/ staff Communication: reviewed care plan with patient and nursing supervisor    [name], MD  [facility] [phone] (Monday-Friday 8 am - 5 pm) [phone] (afterhours)

## 2014-12-06 ENCOUNTER — Telehealth: Payer: Self-pay | Admitting: Family Medicine

## 2014-12-06 NOTE — Telephone Encounter (Signed)
Have called patient back twice this afternoon, they were having problems transferring call from nurses station to a cordless phone as patient does not have phone in room. The nurse said she would call me back.

## 2014-12-06 NOTE — Telephone Encounter (Signed)
Pt returned call to [place]. She would like for [place] to call her back

## 2014-12-06 NOTE — Telephone Encounter (Signed)
[place] [phone] room [phone].

## 2014-12-10 DIAGNOSIS — S82191A Other fracture of upper end of right tibia, initial encounter for closed fracture: Secondary | ICD-10-CM | POA: Diagnosis not present

## 2014-12-11 LAB — BASIC METABOLIC PANEL
BUN: 11 mg/dL (ref 4–21)
CREATININE: 0.4 mg/dL — AB (ref 0.5–1.1)
GLUCOSE: 93 mg/dL
POTASSIUM: 4.2 mmol/L (ref 3.4–5.3)
Sodium: 142 mmol/L (ref 137–147)

## 2014-12-13 ENCOUNTER — Ambulatory Visit: Payer: Medicare Other | Admitting: Podiatry

## 2014-12-14 DIAGNOSIS — Z9181 History of falling: Secondary | ICD-10-CM | POA: Diagnosis not present

## 2014-12-14 DIAGNOSIS — R262 Difficulty in walking, not elsewhere classified: Secondary | ICD-10-CM | POA: Diagnosis not present

## 2014-12-14 DIAGNOSIS — E785 Hyperlipidemia, unspecified: Secondary | ICD-10-CM | POA: Diagnosis not present

## 2014-12-14 DIAGNOSIS — R1312 Dysphagia, oropharyngeal phase: Secondary | ICD-10-CM | POA: Diagnosis not present

## 2014-12-14 DIAGNOSIS — H3530 Unspecified macular degeneration: Secondary | ICD-10-CM | POA: Diagnosis not present

## 2014-12-14 DIAGNOSIS — I10 Essential (primary) hypertension: Secondary | ICD-10-CM | POA: Diagnosis not present

## 2014-12-14 DIAGNOSIS — S82191D Other fracture of upper end of right tibia, subsequent encounter for closed fracture with routine healing: Secondary | ICD-10-CM | POA: Diagnosis not present

## 2014-12-14 DIAGNOSIS — Z4789 Encounter for other orthopedic aftercare: Secondary | ICD-10-CM | POA: Diagnosis not present

## 2014-12-14 DIAGNOSIS — R195 Other fecal abnormalities: Secondary | ICD-10-CM | POA: Diagnosis not present

## 2014-12-14 DIAGNOSIS — R278 Other lack of coordination: Secondary | ICD-10-CM | POA: Diagnosis not present

## 2014-12-14 DIAGNOSIS — M79671 Pain in right foot: Secondary | ICD-10-CM | POA: Diagnosis not present

## 2014-12-14 DIAGNOSIS — M80061D Age-related osteoporosis with current pathological fracture, right lower leg, subsequent encounter for fracture with routine healing: Secondary | ICD-10-CM | POA: Diagnosis not present

## 2014-12-14 DIAGNOSIS — F411 Generalized anxiety disorder: Secondary | ICD-10-CM | POA: Diagnosis not present

## 2014-12-14 DIAGNOSIS — R350 Frequency of micturition: Secondary | ICD-10-CM | POA: Diagnosis not present

## 2014-12-14 DIAGNOSIS — S82291S Other fracture of shaft of right tibia, sequela: Secondary | ICD-10-CM | POA: Diagnosis not present

## 2014-12-14 DIAGNOSIS — M79672 Pain in left foot: Secondary | ICD-10-CM | POA: Diagnosis not present

## 2014-12-14 DIAGNOSIS — E538 Deficiency of other specified B group vitamins: Secondary | ICD-10-CM | POA: Diagnosis not present

## 2014-12-14 DIAGNOSIS — G1221 Amyotrophic lateral sclerosis: Secondary | ICD-10-CM | POA: Diagnosis not present

## 2014-12-14 DIAGNOSIS — B351 Tinea unguium: Secondary | ICD-10-CM | POA: Diagnosis not present

## 2014-12-14 DIAGNOSIS — M6281 Muscle weakness (generalized): Secondary | ICD-10-CM | POA: Diagnosis not present

## 2014-12-14 DIAGNOSIS — F419 Anxiety disorder, unspecified: Secondary | ICD-10-CM | POA: Diagnosis not present

## 2014-12-14 DIAGNOSIS — K219 Gastro-esophageal reflux disease without esophagitis: Secondary | ICD-10-CM | POA: Diagnosis not present

## 2014-12-14 LAB — CBC AND DIFFERENTIAL
HCT: 33 % — AB (ref 36–46)
HEMOGLOBIN: 10.6 g/dL — AB (ref 12.0–16.0)
Platelets: 250 10*3/uL (ref 150–399)
WBC: 5.1 10*3/mL

## 2014-12-25 LAB — CBC AND DIFFERENTIAL
HCT: 33 % — AB (ref 36–46)
HEMOGLOBIN: 10.6 g/dL — AB (ref 12.0–16.0)
Platelets: 250 10*3/uL (ref 150–399)
WBC: 5.1 10*3/mL

## 2015-01-03 ENCOUNTER — Other Ambulatory Visit: Payer: Self-pay

## 2015-01-03 MED ORDER — LORAZEPAM 0.5 MG PO TABS: 0.5000 mg | ORAL_TABLET | Freq: Every day | ORAL | Status: DC

## 2015-01-03 NOTE — Telephone Encounter (Signed)
Rx faxed to [facility] @ [phone], phone number [phone]  

## 2015-01-04 ENCOUNTER — Encounter: Payer: Self-pay | Admitting: Internal Medicine

## 2015-01-04 ENCOUNTER — Non-Acute Institutional Stay (SKILLED_NURSING_FACILITY): Payer: Medicare Other | Admitting: Internal Medicine

## 2015-01-04 DIAGNOSIS — R350 Frequency of micturition: Secondary | ICD-10-CM

## 2015-01-04 DIAGNOSIS — R195 Other fecal abnormalities: Secondary | ICD-10-CM

## 2015-01-04 NOTE — Progress Notes (Signed)
Patient ID: Ashley Vaughan, female   DOB: June 22, 1928, 81 y.o.   MRN: [id number]   [place]  Code Status: DNR  Allergies  Allergen Reactions  . Codeine Other (See Comments)    unknown  . Lexapro [Escitalopram] Nausea Only  . Macrobid [Nitrofurantoin Monohydrate Macrocrystals] Nausea Only    Nausea and headache  . Sulfa Antibiotics Other (See Comments)    dizziness   Chief Complaint  Patient presents with  . Acute Visit    Increased urinary frequency and loose stool     HPI:  81 year old patient is here for short term rehabilitation post nondisplaced right tibia fracture. She had a splint placed and is non weight bearing in her right leg. She is see today with concerns form increased urinary frequency. She was recently treated for e.coli UTI. Denies dysuria, hematuria, flank pain. Denies vaginal discharge. She also complaints of loose stool. She is currently on stool softner and laxative on daily basis. She has PMH of ALS, HTN and GERD   Review of Systems:  Constitutional: Negative for fever, chills, diaphoresis.  HENT: Negative for headache, congestion, nasal discharge Gastrointestinal: Negative for nausea, vomiting, abdominal pain.  Skin: Negative for itching, rash.  Neurological: Negative for dizziness, tingling, focal weakness Psychiatric/Behavioral: Negative for depression  Past Medical History  Diagnosis Date  . Hypertension   . Osteoporosis   . Migraine (atenolol for prophylaxis)  . Macular degeneration   . Kidney stones, calcium oxalate 1993  . Pulmonary hypertension mild,mild AR,mild-mod MR,mild-mod TR  . Hematuria, microscopic DrKimbrough 1993  . Hiatal hernia   . Diverticulosis (L sided) 1994  . Hyperlipidemia   . BCC (basal cell carcinoma of skin) L cheek-DrLupton (6/09)  . Postmenopausal bleeding 2010-DrCole  . Allergy     s/p immunotherapy  . Mitral regurgitation     mild-mod; mild aortic regurgitation, and mild-mod TR  . Atrophic  vaginitis   . Hearing loss     right ear  . B12 deficiency   . ALS (amyotrophic lateral sclerosis)        Medication List       This list is accurate as of: 01/04/15  2:37 PM.  Always use your most recent med list.               acetaminophen 325 MG tablet  Commonly known as:  TYLENOL  Take 2 tablets (650 mg total) by mouth every 6 (six) hours as needed for mild pain (or Temp > 100).     amLODipine 5 MG tablet  Commonly known as:  NORVASC  Take 1 tablet (5 mg total) by mouth daily.     aspirin 81 MG tablet  Take 81 mg by mouth every evening.     B-12 5000 MCG Subl  Place 1 tablet under the tongue daily.     HYDROcodone-acetaminophen 5-325 MG per tablet  Commonly known as:  NORCO/VICODIN  Take 1 tablet by mouth every 4 (four) hours as needed for moderate pain.     LORazepam 0.5 MG tablet  Commonly known as:  ATIVAN  Take 1 tablet (0.5 mg total) by mouth at bedtime.     multivitamin with minerals tablet  Take 1 tablet by mouth daily.     OCUVITE PO  Take 2 tablets by mouth daily.     omeprazole 20 MG capsule  Commonly known as:  PRILOSEC  Take 20 mg by mouth daily.     UNABLE TO FIND  Med Name: Med Pass 90 mL by mouth twice daily for nutritional support        Physical exam BP 118/72 mmHg  Pulse 88  Temp(Src) 98.1 F (36.7 C) (Oral)  Resp 18  SpO2 95%  General- elderly female, thin built, in no acute distress Throat- moist mucus membrane Neck- no cervical lymphadenopathy, no jugular vein distension Respiratory- bilateral clear to auscultation, no wheeze, no rhonchi, no crackles, no use of accessory muscles Abdomen- bowel sounds present, soft, no cva tenderness, no suprapubic tenderness Musculoskeletal- able to move all 4 extremities, both hands have contracture of fingers from ALS, right leg has a long splint, able to move her toes, good capillary refill Neurological- no focal deficit Skin- warm and dry Psychiatry- alert and oriented    Assessment/plan  Urinary frequency Due to recent e.coli UTI, will Send another u/a with c/s. Concern for urinary incontinence. Start myrbetriq 25 mg daily and reassess. Hold off on antibiotics until culture is resulted  Loose stools Likely iatrogenic with her stool softner and laxative. D/c miralax. D/c senna s. Start Colace 100 mg po daily and miralax daily prn only and reassess.   [name], MD  [facility] [phone] (Monday-Friday 8 am - 5 pm) [phone] (afterhours)

## 2015-01-21 ENCOUNTER — Other Ambulatory Visit: Payer: Self-pay | Admitting: *Deleted

## 2015-01-21 MED ORDER — HYDROCODONE-ACETAMINOPHEN 5-325 MG PO TABS: ORAL_TABLET | ORAL | Status: DC

## 2015-01-21 NOTE — Telephone Encounter (Signed)
[facility] 

## 2015-02-01 ENCOUNTER — Encounter: Payer: Self-pay | Admitting: Adult Health

## 2015-02-01 ENCOUNTER — Non-Acute Institutional Stay (SKILLED_NURSING_FACILITY): Payer: Medicare Other | Admitting: Adult Health

## 2015-02-01 DIAGNOSIS — E538 Deficiency of other specified B group vitamins: Secondary | ICD-10-CM

## 2015-02-01 DIAGNOSIS — G1221 Amyotrophic lateral sclerosis: Secondary | ICD-10-CM | POA: Diagnosis not present

## 2015-02-01 DIAGNOSIS — K5900 Constipation, unspecified: Secondary | ICD-10-CM

## 2015-02-01 DIAGNOSIS — S82291S Other fracture of shaft of right tibia, sequela: Secondary | ICD-10-CM

## 2015-02-01 DIAGNOSIS — Z79890 Hormone replacement therapy (postmenopausal): Secondary | ICD-10-CM | POA: Diagnosis not present

## 2015-02-01 DIAGNOSIS — R350 Frequency of micturition: Secondary | ICD-10-CM

## 2015-02-01 DIAGNOSIS — I10 Essential (primary) hypertension: Secondary | ICD-10-CM | POA: Diagnosis not present

## 2015-02-01 DIAGNOSIS — F419 Anxiety disorder, unspecified: Secondary | ICD-10-CM

## 2015-02-01 DIAGNOSIS — K219 Gastro-esophageal reflux disease without esophagitis: Secondary | ICD-10-CM

## 2015-02-08 DIAGNOSIS — M79672 Pain in left foot: Secondary | ICD-10-CM | POA: Diagnosis not present

## 2015-02-08 DIAGNOSIS — B351 Tinea unguium: Secondary | ICD-10-CM | POA: Diagnosis not present

## 2015-02-08 DIAGNOSIS — R262 Difficulty in walking, not elsewhere classified: Secondary | ICD-10-CM | POA: Diagnosis not present

## 2015-02-08 DIAGNOSIS — M79671 Pain in right foot: Secondary | ICD-10-CM | POA: Diagnosis not present

## 2015-02-27 DIAGNOSIS — G1221 Amyotrophic lateral sclerosis: Secondary | ICD-10-CM | POA: Diagnosis not present

## 2015-03-08 ENCOUNTER — Telehealth: Payer: Self-pay | Admitting: Family Medicine

## 2015-03-08 NOTE — Telephone Encounter (Signed)
Jeanett Schlein, a RN from [facility] called requesting to speak to Dr Tomi Bamberger or Verdene Lennert regarding a research study on osteoporosis due to the fracture that the patient had on 6/27. [employer] will reach out to the patient after speaking to the providers office. Call [facility] at [phone]

## 2015-03-08 NOTE — Telephone Encounter (Signed)
V--please call [employer].  I doubt she will be any kind of candidate given her diagnosis/prognosis

## 2015-03-11 NOTE — Telephone Encounter (Signed)
Spoke with [facility] and gave her info that she needed....GJ:FTNB Dexa date, med list, etc. She did not let me know whether or not patient qualified for study.

## 2015-03-31 DIAGNOSIS — M25579 Pain in unspecified ankle and joints of unspecified foot: Secondary | ICD-10-CM | POA: Diagnosis not present

## 2015-03-31 DIAGNOSIS — M25569 Pain in unspecified knee: Secondary | ICD-10-CM | POA: Diagnosis not present

## 2015-04-09 ENCOUNTER — Other Ambulatory Visit: Payer: Self-pay | Admitting: *Deleted

## 2015-04-09 MED ORDER — HYDROCODONE-ACETAMINOPHEN 5-325 MG PO TABS: ORAL_TABLET | ORAL | Status: DC

## 2015-04-09 NOTE — Telephone Encounter (Signed)
[facility] 

## 2015-04-30 ENCOUNTER — Non-Acute Institutional Stay (SKILLED_NURSING_FACILITY): Payer: Medicare Other | Admitting: Adult Health

## 2015-04-30 DIAGNOSIS — K5900 Constipation, unspecified: Secondary | ICD-10-CM | POA: Diagnosis not present

## 2015-04-30 DIAGNOSIS — E538 Deficiency of other specified B group vitamins: Secondary | ICD-10-CM

## 2015-04-30 DIAGNOSIS — G4700 Insomnia, unspecified: Secondary | ICD-10-CM

## 2015-04-30 DIAGNOSIS — Z79890 Hormone replacement therapy (postmenopausal): Secondary | ICD-10-CM

## 2015-04-30 DIAGNOSIS — I10 Essential (primary) hypertension: Secondary | ICD-10-CM

## 2015-04-30 DIAGNOSIS — S82291S Other fracture of shaft of right tibia, sequela: Secondary | ICD-10-CM

## 2015-04-30 DIAGNOSIS — G1221 Amyotrophic lateral sclerosis: Secondary | ICD-10-CM

## 2015-04-30 DIAGNOSIS — R350 Frequency of micturition: Secondary | ICD-10-CM

## 2015-04-30 DIAGNOSIS — K219 Gastro-esophageal reflux disease without esophagitis: Secondary | ICD-10-CM | POA: Diagnosis not present

## 2015-04-30 DIAGNOSIS — F419 Anxiety disorder, unspecified: Secondary | ICD-10-CM | POA: Diagnosis not present

## 2015-05-01 DIAGNOSIS — I10 Essential (primary) hypertension: Secondary | ICD-10-CM | POA: Diagnosis not present

## 2015-05-01 LAB — HEPATIC FUNCTION PANEL
ALT: 7 U/L (ref 7–35)
AST: 10 U/L — AB (ref 13–35)
Alkaline Phosphatase: 58 U/L (ref 25–125)
BILIRUBIN, TOTAL: 0.4 mg/dL

## 2015-05-01 LAB — BASIC METABOLIC PANEL
BUN: 14 mg/dL (ref 4–21)
CREATININE: 0.4 mg/dL — AB (ref 0.5–1.1)
Glucose: 88 mg/dL
Potassium: 3.9 mmol/L (ref 3.4–5.3)
Sodium: 143 mmol/L (ref 137–147)

## 2015-05-01 LAB — CBC AND DIFFERENTIAL
HCT: 37 % (ref 36–46)
Hemoglobin: 11.9 g/dL — AB (ref 12.0–16.0)
Platelets: 206 10*3/uL (ref 150–399)
WBC: 5 10*3/mL

## 2015-05-04 ENCOUNTER — Encounter: Payer: Self-pay | Admitting: Adult Health

## 2015-05-04 NOTE — Progress Notes (Signed)
Patient ID: Ashley Vaughan, female   DOB: 1929-03-03, 81 y.o.   MRN: [id number] P  04/30/15  Facility:  Nursing Home Location:  [place] Room Number: [id number] LEVEL OF CARE:  SNF (31)   Chief Complaint  Patient presents with  . Medical Management of Chronic Issues    Right tibia fracture, ALS, hypertension, vitamin B12 deficiency, anxiety, GERD and HRT    HISTORY OF PRESENT ILLNESS:  This is an 81 year old female who has been admitted to [facility] on 12/03/14 from [facility]. She has PMH of hypertension, osteoporosis, migraine, hyperlipidemia, ALS, vitamin B12 deficiency, macular degeneration and right hearing loss. She had a fall landing on her bilateral knees. She was diagnosed with ALS in 2014 and has progressed since then.  Orthopedics was consulted and recommended nonsurgical intervention.   She had short-term rehabilitation. Daughter-in-law and patient has requested for hospice consultation. Patient was seen @ [place] [facility] @ [facility] last 10/16, Dr. Freida Busman.  PAST MEDICAL HISTORY:  Past Medical History  Diagnosis Date  . Hypertension   . Osteoporosis   . Migraine (atenolol for prophylaxis)  . Macular degeneration   . Kidney stones, calcium oxalate 1993  . Pulmonary hypertension (HCC) mild,mild AR,mild-mod MR,mild-mod TR  . Hematuria, microscopic DrKimbrough 1993  . Hiatal hernia   . Diverticulosis (L sided) 1994  . Hyperlipidemia   . BCC (basal cell carcinoma of skin) L cheek-DrLupton (6/09)  . Postmenopausal bleeding 2010-DrCole  . Allergy     s/p immunotherapy  . Mitral regurgitation     mild-mod; mild aortic regurgitation, and mild-mod TR  . Atrophic vaginitis   . Hearing loss     right ear  . B12 deficiency   . ALS (amyotrophic lateral sclerosis) ([place])     CURRENT MEDICATIONS: Reviewed per MAR/see medication list   Medication List       This list is accurate as of: 04/30/15 11:59  PM.  Always use your most recent med list.               acetaminophen 325 MG tablet  Commonly known as:  TYLENOL  Take 2 tablets (650 mg total) by mouth every 6 (six) hours as needed for mild pain (or Temp > 100).     amLODipine 5 MG tablet  Commonly known as:  NORVASC  Take 1 tablet (5 mg total) by mouth daily.     aspirin 81 MG tablet  Take 81 mg by mouth every evening.     B-12 5000 MCG Subl  Place 1 tablet under the tongue daily.     loratadine 10 MG tablet  Commonly known as:  CLARITIN  Take 10 mg by mouth daily.     LORazepam 0.5 MG tablet  Commonly known as:  ATIVAN  Take 1 tablet (0.5 mg total) by mouth at bedtime.     Melatonin 3 MG Tabs  Take 3 mg by mouth at bedtime.     multivitamin with minerals tablet  Take 1 tablet by mouth daily.     OCUVITE PO  Take 2 tablets by mouth daily.     omeprazole 20 MG capsule  Commonly known as:  PRILOSEC  Take 20 mg by mouth daily.     polyethylene glycol packet  Commonly known as:  MIRALAX / GLYCOLAX  Take 17 g by mouth at bedtime. Take 8.5 g only Q HS     UNABLE TO FIND  Med  Name: Med Pass 90 mL by mouth twice daily for nutritional support            Allergies  Allergen Reactions  . Codeine Other (See Comments)    unknown  . Lexapro [Escitalopram] Nausea Only  . Macrobid [Nitrofurantoin Monohydrate Macrocrystals] Nausea Only    Nausea and headache  . Sulfa Antibiotics Other (See Comments)    dizziness     REVIEW OF SYSTEMS:  GENERAL: no change in appetite, no fatigue, no weight changes, no fever, chills or weakness RESPIRATORY: no cough, SOB, DOE, wheezing, hemoptysis CARDIAC: no chest pain, edema or palpitations GI: no abdominal pain, diarrhea, constipation, heart burn, nausea or vomiting  PHYSICAL EXAMINATION  GENERAL: no acute distress, normal body habitus EYES: conjunctivae normal, sclerae normal, normal eye lids NECK: supple, trachea midline, no neck masses, no thyroid tenderness, no  thyromegaly LYMPHATICS: no LAN in the neck, no supraclavicular LAN RESPIRATORY: breathing is even & unlabored, BS CTAB CARDIAC: RRR, no murmur,no extra heart sounds, no edema GI: abdomen soft, normal BS, no masses, no tenderness, no hepatomegaly, no splenomegaly EXTREMITIES:  Able to move 4 extremities; generalized weakness BLE PSYCHIATRIC: the patient is alert & oriented to person, affect & behavior appropriate  LABS/RADIOLOGY: Labs Reviewed: 12/25/14  WBC 5.1 hemoglobin 10.6 hematocrit 32.9 MCV 92.7 platelet 250 12/11/14  WBC 5.8 hemoglobin 8.6 hematocrit 26.4 MCV 93.0 platelet 314 sodium 142 potassium 4.2 glucose 93 BUN 11 creatinine 0.43 calcium 8.4  ASSESSMENT/PLAN:  Closed right tibia  Fracture -  Now on restorative care; continue Norco 5/325 mg 1 tab by mouth Q HS and every 4 hours when necessary for pain   ALS  - continue restorative care; hospice consultation with [facility]  Hypertension  - continue Norvasc 5 mg 1 tab by mouth daily   Vitamin B12 deficiency - continue vitamin B12 5000 g 1 tab SL Q D  Anxiety - mood is stable; continue Ativan 0.5 mg 1 tab by mouth daily at bedtime  GERD - continue Prilosec 20 mg 1 capsule by mouth daily  HRT - continue Premarin vaginal cream apply 1/2 gm vaginally twice weekly   Urinary frequency - continue Myrbetriq treat 25 mg 1 tab by mouth daily  Constipation - continue  MiraLAX 8.5 g by mouth @ HS  Insomnia - continue melatonin 3 mg 1 tab by mouth daily at bedtime   Goals of care:  Long-term rehabilitation    [facility], NP [facility] [phone]

## 2015-05-04 NOTE — Progress Notes (Signed)
Patient ID: Ashley Vaughan, female   DOB: 05/24/1929, 81 y.o.   MRN: [id number] P  02/01/15  Facility:  Nursing Home Location:  [place] Room Number: [id number] LEVEL OF CARE:  SNF (31)   Chief Complaint  Patient presents with  . Medical Management of Chronic Issues    Right tibia fracture, ALS, hypertension, vitamin B12 deficiency, anxiety, GERD and HRT    HISTORY OF PRESENT ILLNESS:  This is an 82 year old female who has been admitted to [facility] on 12/03/14 from [facility]. She has PMH of hypertension, osteoporosis, migraine, hyperlipidemia, ALS, vitamin B12 deficiency, macular degeneration and right hearing loss. She had a fall landing on her bilateral knees. She was diagnosed with ALS in 2014 and has progressed since then. She is currently on hospice at home. Orthopedics was consulted and recommended nonsurgical intervention.   She has been admitted for a short-term rehabilitation.  PAST MEDICAL HISTORY:  Past Medical History  Diagnosis Date  . Hypertension   . Osteoporosis   . Migraine (atenolol for prophylaxis)  . Macular degeneration   . Kidney stones, calcium oxalate 1993  . Pulmonary hypertension mild,mild AR,mild-mod MR,mild-mod TR  . Hematuria, microscopic DrKimbrough 1993  . Hiatal hernia   . Diverticulosis (L sided) 1994  . Hyperlipidemia   . BCC (basal cell carcinoma of skin) L cheek-DrLupton (6/09)  . Postmenopausal bleeding 2010-DrCole  . Allergy     s/p immunotherapy  . Mitral regurgitation     mild-mod; mild aortic regurgitation, and mild-mod TR  . Atrophic vaginitis   . Hearing loss     right ear  . B12 deficiency   . ALS (amyotrophic lateral sclerosis)     CURRENT MEDICATIONS: Reviewed per MAR/see medication list    Medication List       This list is accurate as of: 02/01/15 11:59 PM.  Always use your most recent med list.               acetaminophen 325 MG tablet  Commonly known as:  TYLENOL  Take  2 tablets (650 mg total) by mouth every 6 (six) hours as needed for mild pain (or Temp > 100).     amLODipine 5 MG tablet  Commonly known as:  NORVASC  Take 1 tablet (5 mg total) by mouth daily.     aspirin 81 MG tablet  Take 81 mg by mouth every evening.     B-12 5000 MCG Subl  Place 1 tablet under the tongue daily.     HYDROcodone-acetaminophen 5-325 MG tablet  Commonly known as:  NORCO/VICODIN  Take one tablet by mouth every night at bedtime for pain. Do not exceed 4gm of Tylenol in 24 hours     LORazepam 0.5 MG tablet  Commonly known as:  ATIVAN  Take 1 tablet (0.5 mg total) by mouth at bedtime.     multivitamin with minerals tablet  Take 1 tablet by mouth daily.     OCUVITE PO  Take 2 tablets by mouth daily.     omeprazole 20 MG capsule  Commonly known as:  PRILOSEC  Take 20 mg by mouth daily.     polyethylene glycol packet  Commonly known as:  MIRALAX / GLYCOLAX  Take 17 g by mouth daily asneeded     Colace 100 mg capsule  Colace 100 mg 1 capsule by mouth twice a day       [place] Name: Med Pass  90 mL by mouth twice daily for nutritional support         Allergies  Allergen Reactions  . Codeine Other (See Comments)    unknown  . Lexapro [Escitalopram] Nausea Only  . Macrobid [Nitrofurantoin Monohydrate Macrocrystals] Nausea Only    Nausea and headache  . Sulfa Antibiotics Other (See Comments)    dizziness     REVIEW OF SYSTEMS:  GENERAL: no change in appetite, no fatigue, no weight changes, no fever, chills or weakness RESPIRATORY: no cough, SOB, DOE, wheezing, hemoptysis CARDIAC: no chest pain, edema or palpitations GI: no abdominal pain, diarrhea, constipation, heart burn, nausea or vomiting  PHYSICAL EXAMINATION  GENERAL: no acute distress, normal body habitus EYES: conjunctivae normal, sclerae normal, normal eye lids NECK: supple, trachea midline, no neck masses, no thyroid tenderness, no thyromegaly LYMPHATICS: no LAN in the  neck, no supraclavicular LAN RESPIRATORY: breathing is even & unlabored, BS CTAB CARDIAC: RRR, no murmur,no extra heart sounds, no edema GI: abdomen soft, normal BS, no masses, no tenderness, no hepatomegaly, no splenomegaly EXTREMITIES:  Able to move 4 extremities; long leg splint on RLE PSYCHIATRIC: the patient is alert & oriented to person, affect & behavior appropriate  LABS/RADIOLOGY: Labs Reviewed: 12/25/14  WBC 5.1 hemoglobin 10.6 hematocrit 32.9 MCV 92.7 platelet 250 12/11/14  WBC 5.8 hemoglobin 8.6 hematocrit 26.4 MCV 93.0 platelet 314 sodium 142 potassium 4.2 glucose 93 BUN 11 creatinine 0.43 calcium 8.4  ASSESSMENT/PLAN:  Closed right tibia  Fracture -  Continue rehabilitation; continue Norco 5/325 mg 1 tab by mouth every 4 hours when necessary for pain; and follow-up with Dr. Doran Durand, orthopedic surgeon   ALS  - for rehabilitation   Hypertension  - continue Norvasc 5 mg 1 tab by mouth daily   Vitamin B12 deficiency - continue vitamin B12 5000 g 1 tab SL Q D  Anxiety - mood is stable; continue Ativan 0.5 mg 1 tab by mouth daily at bedtime  GERD - continue Prilosec 20 mg 1 capsule by mouth daily  HRT - continue Premarin vaginal cream apply 1/2 gm vaginally twice weekly   Urinary frequency - continue Myrbetriq treat 25 mg 1 tab by mouth daily  Constipation - continue Colace 100 mg 1 capsule by mouth daily and MiraLAX 17 g by mouth daily when necessary     Goals of care:  Short-term rehabilitation    [facility], NP [place]

## 2015-05-27 DIAGNOSIS — F411 Generalized anxiety disorder: Secondary | ICD-10-CM | POA: Diagnosis not present

## 2015-05-27 DIAGNOSIS — Z9181 History of falling: Secondary | ICD-10-CM | POA: Diagnosis not present

## 2015-06-10 DIAGNOSIS — M79671 Pain in right foot: Secondary | ICD-10-CM | POA: Diagnosis not present

## 2015-06-10 DIAGNOSIS — M79672 Pain in left foot: Secondary | ICD-10-CM | POA: Diagnosis not present

## 2015-06-10 DIAGNOSIS — B351 Tinea unguium: Secondary | ICD-10-CM | POA: Diagnosis not present

## 2015-06-14 ENCOUNTER — Other Ambulatory Visit: Payer: Self-pay | Admitting: *Deleted

## 2015-06-14 MED ORDER — LORAZEPAM 0.5 MG PO TABS: ORAL_TABLET | ORAL | Status: DC

## 2015-06-14 NOTE — Telephone Encounter (Signed)
[facility] 

## 2015-07-08 ENCOUNTER — Encounter: Payer: Self-pay | Admitting: Adult Health

## 2015-07-08 ENCOUNTER — Non-Acute Institutional Stay (SKILLED_NURSING_FACILITY): Payer: Medicare Other | Admitting: Adult Health

## 2015-07-08 DIAGNOSIS — J309 Allergic rhinitis, unspecified: Secondary | ICD-10-CM

## 2015-07-08 DIAGNOSIS — G1221 Amyotrophic lateral sclerosis: Secondary | ICD-10-CM

## 2015-07-08 DIAGNOSIS — R350 Frequency of micturition: Secondary | ICD-10-CM | POA: Diagnosis not present

## 2015-07-08 DIAGNOSIS — G4700 Insomnia, unspecified: Secondary | ICD-10-CM

## 2015-07-08 DIAGNOSIS — E538 Deficiency of other specified B group vitamins: Secondary | ICD-10-CM | POA: Diagnosis not present

## 2015-07-08 DIAGNOSIS — I10 Essential (primary) hypertension: Secondary | ICD-10-CM

## 2015-07-08 DIAGNOSIS — S82291S Other fracture of shaft of right tibia, sequela: Secondary | ICD-10-CM

## 2015-07-08 DIAGNOSIS — Z79890 Hormone replacement therapy (postmenopausal): Secondary | ICD-10-CM

## 2015-07-08 DIAGNOSIS — K219 Gastro-esophageal reflux disease without esophagitis: Secondary | ICD-10-CM

## 2015-07-08 DIAGNOSIS — F419 Anxiety disorder, unspecified: Secondary | ICD-10-CM | POA: Diagnosis not present

## 2015-07-08 DIAGNOSIS — K5900 Constipation, unspecified: Secondary | ICD-10-CM | POA: Diagnosis not present

## 2015-07-08 NOTE — Progress Notes (Signed)
Patient ID: Ashley Vaughan, female   DOB: 1929-03-28, 82 y.o.   MRN: [id number] P  DATE:    07/08/15  Facility:  Nursing Home Location:  [place] Room Number: [id number] LEVEL OF CARE:  SNF (31)   Chief Complaint  Patient presents with  . Medical Management of Chronic Issues    Right tibia fracture, ALS, hypertension, vitamin B12 deficiency, anxiety, GERD and HRT    HISTORY OF PRESENT ILLNESS:  This is an 82 year old female who is being seen for a routine visit. She is comfort care/hospice and followed-up by [facility]. She is now taking Claritin 10 mg @ HS for allergic rhinitis.  She has been admitted to [facility] on 12/03/14 from [facility]. She has PMH of hypertension, osteoporosis, migraine, hyperlipidemia, ALS, vitamin B12 deficiency, macular degeneration and right hearing loss. She had a fall landing on her bilateral knees. She was diagnosed with ALS in 2014 and has progressed since then.  Orthopedics was consulted and recommended nonsurgical intervention.   PAST MEDICAL HISTORY:  Past Medical History  Diagnosis Date  . Hypertension   . Osteoporosis   . Migraine (atenolol for prophylaxis)  . Macular degeneration   . Kidney stones, calcium oxalate 1993  . Pulmonary hypertension (HCC) mild,mild AR,mild-mod MR,mild-mod TR  . Hematuria, microscopic DrKimbrough 1993  . Hiatal hernia   . Diverticulosis (L sided) 1994  . Hyperlipidemia   . BCC (basal cell carcinoma of skin) L cheek-DrLupton (6/09)  . Postmenopausal bleeding 2010-DrCole  . Allergy     s/p immunotherapy  . Mitral regurgitation     mild-mod; mild aortic regurgitation, and mild-mod TR  . Atrophic vaginitis   . Hearing loss     right ear  . B12 deficiency   . ALS (amyotrophic lateral sclerosis) ([place])     CURRENT MEDICATIONS: Reviewed per MAR/see medication list   Medication List       This list is accurate as of: 07/08/15  6:39 PM.  Always use your most recent med list.               acetaminophen 325 MG tablet  Commonly known as:  TYLENOL  Take 650 mg by mouth every 4 (four) hours as needed for mild pain or fever (Fever >100).     amLODipine 5 MG tablet  Commonly known as:  NORVASC  Take 1 tablet (5 mg total) by mouth daily.     aspirin 81 MG tablet  Take 81 mg by mouth every evening.     B-12 5000 MCG Subl  Place 1 tablet under the tongue daily.     estrogens (conjugated) 0.625 MG tablet  Commonly known as:  PREMARIN  Take 0.625 mg by mouth 2 (two) times a week. Insert half gram vaginally     HYDROcodone-acetaminophen 5-325 MG tablet  Commonly known as:  NORCO/VICODIN  Take 1 tablet by mouth every 4 (four) hours as needed for moderate pain.     HYDROcodone-acetaminophen 5-325 MG tablet  Commonly known as:  NORCO/VICODIN  Take 1 tablet by mouth at bedtime. Do not exceed 4 gm of Tylenol in 24 hours     loratadine 10 MG tablet  Commonly known as:  CLARITIN  Take 10 mg by mouth at bedtime.     LORazepam 0.5 MG tablet  Commonly known as:  ATIVAN  Take one tablet by mouth every night at bedtime for anxiety     Melatonin 3 MG Tabs  Take  3 mg by mouth at bedtime.     mirabegron ER 25 MG Tb24 tablet  Commonly known as:  MYRBETRIQ  Take 25 mg by mouth daily.     OCUVITE EYE + MULTI Tabs  Take 2 tablets by mouth.     multivitamin with minerals tablet  Take 1 tablet by mouth daily.     omeprazole 20 MG capsule  Commonly known as:  PRILOSEC  Take 20 mg by mouth daily.     polyethylene glycol packet  Commonly known as:  MIRALAX / GLYCOLAX  Take 17 g by mouth daily.     sodium chloride 0.65 % Soln nasal spray  Commonly known as:  OCEAN  Place 2 sprays into both nostrils as needed for congestion.     UNABLE TO FIND  Med Name: Med Pass 90 mL by mouth twice daily for nutritional support             Allergies  Allergen Reactions  . Codeine Other (See Comments)    unknown  . Lexapro [Escitalopram] Nausea Only  . Macrobid  [Nitrofurantoin Monohydrate Macrocrystals] Nausea Only    Nausea and headache  . Sulfa Antibiotics Other (See Comments)    dizziness     REVIEW OF SYSTEMS:  GENERAL: no change in appetite, no fatigue, no weight changes, no fever, chills or weakness RESPIRATORY: no cough, SOB, DOE, wheezing, hemoptysis CARDIAC: no chest pain, edema or palpitations GI: no abdominal pain, diarrhea, constipation, heart burn, nausea or vomiting  PHYSICAL EXAMINATION  GENERAL: no acute distress, normal body habitus EYES: conjunctivae normal, sclerae normal, normal eye lids NECK: supple, trachea midline, no neck masses, no thyroid tenderness, no thyromegaly LYMPHATICS: no LAN in the neck, no supraclavicular LAN RESPIRATORY: breathing is even & unlabored, BS CTAB CARDIAC: RRR, no murmur,no extra heart sounds, no edema GI: abdomen soft, normal BS, no masses, no tenderness, no hepatomegaly, no splenomegaly EXTREMITIES:  Able to move 4 extremities; generalized weakness BLE PSYCHIATRIC: the patient is alert & oriented to person, affect & behavior appropriate  LABS/RADIOLOGY: Labs Reviewed: Labs reviewed: Basic Metabolic Panel:  Recent Labs  08/02/14 1533 11/30/14 1541 12/11/14 05/01/15  NA 141 139 142 143  K 3.6 3.6 4.2 3.9  CL 103 102  --   --   CO2 26  --   --   --   GLUCOSE 110* 138*  --   --   BUN 10 15 11 14   CREATININE 0.58 0.70 0.4* 0.4*  CALCIUM 9.7  --   --   --    Liver Function Tests:  Recent Labs  08/02/14 1533 05/01/15  AST 13 10*  ALT 9 7  ALKPHOS 45 58  BILITOT 0.5  --   PROT 6.4  --   ALBUMIN 4.1  --    CBC:  Recent Labs  08/02/14 1533 11/30/14 1528  12/14/14 12/25/14 05/01/15  WBC 6.6 9.3  --  5.1 5.1 5.0  NEUTROABS 4.2  --   --   --   --   --   HGB 12.2 11.8*  < > 10.6* 10.6* 11.9*  HCT 36.9 35.6*  < > 33* 33* 37  MCV 95.6 94.7  --   --   --   --   PLT 246 186  --  250 250 206  < > = values in this interval not displayed.  12/25/14  WBC 5.1 hemoglobin 10.6  hematocrit 32.9 MCV 92.7 platelet 250 12/11/14  WBC 5.8 hemoglobin 8.6 hematocrit 26.4  MCV 93.0 platelet 314 sodium 142 potassium 4.2 glucose 93 BUN 11 creatinine 0.43 calcium 8.4     ASSESSMENT/PLAN:  Closed right tibia  Fracture -  continue Norco 5/325 mg 1 tab by mouth daily at bedtime and every 4 hours when necessary for pain   ALS  - on hospice/comfort care  Hypertension  - continue Norvasc 5 mg 1 tab by mouth daily   Vitamin B12 deficiency - continue vitamin B12 5000 g 1 tab SL Q D  Anxiety - mood is stable; continue Ativan 0.5 mg 1 tab by mouth daily at bedtime  GERD - continue Prilosec 20 mg 1 capsule by mouth daily  HRT - continue Premarin vaginal cream apply 1/2 gm vaginally twice weekly   Urinary frequency - continue Myrbetriq treat 25 mg 1 tab by mouth daily  Constipation - continue  MiraLAX 8.5 g by mouth @ HS  Insomnia - continue melatonin 3 mg 1 tab by mouth daily at bedtime  Allergic rhinitis - continue Claritin 10 mg 1 tab by mouth daily at bedtime     Goals of care:  Long-term care/hospice    [facility], NP [place]

## 2015-08-05 ENCOUNTER — Encounter: Payer: Self-pay | Admitting: Adult Health

## 2015-08-05 ENCOUNTER — Non-Acute Institutional Stay (SKILLED_NURSING_FACILITY): Payer: Medicare Other | Admitting: Adult Health

## 2015-08-05 DIAGNOSIS — I10 Essential (primary) hypertension: Secondary | ICD-10-CM

## 2015-08-05 DIAGNOSIS — G1221 Amyotrophic lateral sclerosis: Secondary | ICD-10-CM

## 2015-08-05 DIAGNOSIS — Z79890 Hormone replacement therapy (postmenopausal): Secondary | ICD-10-CM

## 2015-08-05 DIAGNOSIS — G4700 Insomnia, unspecified: Secondary | ICD-10-CM

## 2015-08-05 DIAGNOSIS — J309 Allergic rhinitis, unspecified: Secondary | ICD-10-CM | POA: Diagnosis not present

## 2015-08-05 DIAGNOSIS — F419 Anxiety disorder, unspecified: Secondary | ICD-10-CM | POA: Diagnosis not present

## 2015-08-05 DIAGNOSIS — R350 Frequency of micturition: Secondary | ICD-10-CM | POA: Diagnosis not present

## 2015-08-05 DIAGNOSIS — S82291S Other fracture of shaft of right tibia, sequela: Secondary | ICD-10-CM | POA: Diagnosis not present

## 2015-08-05 DIAGNOSIS — E538 Deficiency of other specified B group vitamins: Secondary | ICD-10-CM

## 2015-08-05 DIAGNOSIS — K219 Gastro-esophageal reflux disease without esophagitis: Secondary | ICD-10-CM

## 2015-08-05 DIAGNOSIS — K5900 Constipation, unspecified: Secondary | ICD-10-CM

## 2015-08-05 NOTE — Progress Notes (Signed)
This encounter was created in error - please disregard.

## 2015-08-05 NOTE — Progress Notes (Signed)
Patient ID: Ashley Vaughan, female   DOB: [birth year], 82 y.o.   MRN: [id number]    DATE:    08/05/15  Facility:  Nursing Home Location:  [place] Room Number: [id number] LEVEL OF CARE:  SNF (31)   Chief Complaint  Patient presents with  . Medical Management of Chronic Issues    HISTORY OF PRESENT ILLNESS:  This is an 82 year old female who is being seen for a routine visit. She is comfort care/hospice and followed-up by [facility]. She complains that she does not sleep the whole night and requested that her medication for insomnia to be increased. She said that she has to be awake in the middle of the night to be changed since she is incontinent. She is requesting that her multivitamins be reinstituted.  She has PMH of hypertension, osteoporosis, migraine, hyperlipidemia, ALS, vitamin B12 deficiency, macular degeneration and right hearing loss. She had a fall landing on her bilateral knees. Orthopedics was consulted and recommended nonsurgical intervention. She was diagnosed with ALS in 2014 and has progressed since then.   PAST MEDICAL HISTORY:  Past Medical History  Diagnosis Date  . Hypertension   . Osteoporosis   . Migraine (atenolol for prophylaxis)  . Macular degeneration   . Kidney stones, calcium oxalate 1993  . Pulmonary hypertension (HCC) mild,mild AR,mild-mod MR,mild-mod TR  . Hematuria, microscopic DrKimbrough 1993  . Hiatal hernia   . Diverticulosis (L sided) 1994  . Hyperlipidemia   . BCC (basal cell carcinoma of skin) L cheek-DrLupton (6/09)  . Postmenopausal bleeding 2010-DrCole  . Allergy     s/p immunotherapy  . Mitral regurgitation     mild-mod; mild aortic regurgitation, and mild-mod TR  . Atrophic vaginitis   . Hearing loss     right ear  . B12 deficiency   . ALS (amyotrophic lateral sclerosis) ([place])     CURRENT MEDICATIONS: Reviewed per MAR/see medication list   Medication List       This list is accurate as of: 08/05/15   7:05 PM.  Always use your most recent med list.               acetaminophen 325 MG tablet  Commonly known as:  TYLENOL  Take 650 mg by mouth every 4 (four) hours as needed for mild pain or fever (Fever >100).     amLODipine 5 MG tablet  Commonly known as:  NORVASC  Take 1 tablet (5 mg total) by mouth daily.     aspirin 81 MG tablet  Take 81 mg by mouth at bedtime.     B-12 5000 MCG Subl  Place 1 tablet under the tongue daily.     estrogens (conjugated) 0.625 MG tablet  Commonly known as:  PREMARIN  Take 0.625 mg by mouth 2 (two) times a week. Insert half gram vaginally     HYDROcodone-acetaminophen 5-325 MG tablet  Commonly known as:  NORCO/VICODIN  Take 1 tablet by mouth every 4 (four) hours as needed for moderate pain.     HYDROcodone-acetaminophen 5-325 MG tablet  Commonly known as:  NORCO/VICODIN  Take 1 tablet by mouth at bedtime. Do not exceed 4 gm of Tylenol in 24 hours     LORazepam 0.5 MG tablet  Commonly known as:  ATIVAN  Take one tablet by mouth every night at bedtime for anxiety     Melatonin 3 MG Tabs  Take 3 mg by mouth at bedtime.     mirabegron ER  25 MG Tb24 tablet  Commonly known as:  MYRBETRIQ  Take 25 mg by mouth daily.     OCUVITE EYE + MULTI Tabs  Take 2 tablets by mouth.     omeprazole 20 MG capsule  Commonly known as:  PRILOSEC  Take 20 mg by mouth daily.     polyethylene glycol packet  Commonly known as:  MIRALAX / GLYCOLAX  Take 17 g by mouth daily.     sodium chloride 0.65 % Soln nasal spray  Commonly known as:  OCEAN  Place 2 sprays into both nostrils as needed for congestion.     UNABLE TO FIND  Med Name: Med Pass 90 mL by mouth twice daily for nutritional support             Allergies  Allergen Reactions  . Codeine Other (See Comments)    unknown  . Lexapro [Escitalopram] Nausea Only  . Macrobid [Nitrofurantoin Monohydrate Macrocrystals] Nausea Only    Nausea and headache  . Sulfa Antibiotics Other (See  Comments)    dizziness     REVIEW OF SYSTEMS:  GENERAL: no change in appetite, no fatigue, no weight changes, no fever, chills or weakness RESPIRATORY: no cough, SOB, DOE, wheezing, hemoptysis CARDIAC: no chest pain, edema or palpitations GI: no abdominal pain, diarrhea, constipation, heart burn, nausea or vomiting  PHYSICAL EXAMINATION  GENERAL: no acute distress, normal body habitus EYES: conjunctivae normal, sclerae normal, normal eye lids NECK: supple, trachea midline, no neck masses, no thyroid tenderness, no thyromegaly LYMPHATICS: no LAN in the neck, no supraclavicular LAN RESPIRATORY: breathing is even & unlabored, BS CTAB CARDIAC: RRR, no murmur,no extra heart sounds, no edema GI: abdomen soft, normal BS, no masses, no tenderness, no hepatomegaly, no splenomegaly EXTREMITIES:  Able to move 4 extremities; generalized weakness BLE; contracted fingers on bilateral hands; refusing to wear splints since she feels tied down PSYCHIATRIC: the patient is alert & oriented to person, affect & behavior appropriate  LABS/RADIOLOGY: Labs Reviewed: Labs reviewed: Basic Metabolic Panel:  Recent Labs  11/30/14 1541 12/11/14 05/01/15  NA 139 142 143  K 3.6 4.2 3.9  CL 102  --   --   GLUCOSE 138*  --   --   BUN 15 11 14   CREATININE 0.70 0.4* 0.4*   Liver Function Tests:  Recent Labs  05/01/15  AST 10*  ALT 7  ALKPHOS 58   CBC:  Recent Labs  11/30/14 1528  12/14/14 12/25/14 05/01/15  WBC 9.3  --  5.1 5.1 5.0  HGB 11.8*  < > 10.6* 10.6* 11.9*  HCT 35.6*  < > 33* 33* 37  MCV 94.7  --   --   --   --   PLT 186  --  250 250 206  < > = values in this interval not displayed.  12/25/14  WBC 5.1 hemoglobin 10.6 hematocrit 32.9 MCV 92.7 platelet 250 12/11/14  WBC 5.8 hemoglobin 8.6 hematocrit 26.4 MCV 93.0 platelet 314 sodium 142 potassium 4.2 glucose 93 BUN 11 creatinine 0.43 calcium 8.4     ASSESSMENT/PLAN:  Closed right tibia  Fracture -  continue Norco 5/325 mg 1 tab  by mouth daily at bedtime and every 4 hours when necessary for pain   ALS  - on hospice/comfort care  Hypertension  - continue Norvasc 5 mg 1 tab by mouth daily; check CMP   Vitamin B12 deficiency - continue vitamin B12 5000 g 1 tab SL Q D  Anxiety - mood is stable;  continue Ativan 0.5 mg 1 tab by mouth daily at bedtime  GERD - continue Prilosec 20 mg 1 capsule by mouth daily  HRT - continue Premarin vaginal cream apply 1/2 gm vaginally twice weekly   Urinary frequency - change Myrbetriq  25 mg 1 tab by mouth Q HS instead of AM; check CBC  Constipation - continue  MiraLAX 8.5 g by mouth @ HS  Insomnia - increase melatonin to 5 mg 1 tab by mouth daily at bedtime  Allergic rhinitis - stable; Claritin was recently discontinued     Goals of care:  Long-term care/hospice    [facility], NP [employer] [phone]

## 2015-08-06 DIAGNOSIS — Z79899 Other long term (current) drug therapy: Secondary | ICD-10-CM | POA: Diagnosis not present

## 2015-08-06 LAB — BASIC METABOLIC PANEL
BUN: 15 mg/dL (ref 4–21)
CREATININE: 0.4 mg/dL — AB (ref 0.5–1.1)
GLUCOSE: 94 mg/dL
POTASSIUM: 3.7 mmol/L (ref 3.4–5.3)
Sodium: 143 mmol/L (ref 137–147)

## 2015-08-06 LAB — CBC AND DIFFERENTIAL
HCT: 38 % (ref 36–46)
Hemoglobin: 12.7 g/dL (ref 12.0–16.0)
Neutrophils Absolute: 3 /uL
Platelets: 186 10*3/uL (ref 150–399)
WBC: 5.9 10*3/mL

## 2015-08-06 LAB — HEPATIC FUNCTION PANEL
ALT: 7 U/L (ref 7–35)
AST: 10 U/L — AB (ref 13–35)
Alkaline Phosphatase: 55 U/L (ref 25–125)
Bilirubin, Total: 0.5 mg/dL

## 2015-08-14 DIAGNOSIS — H3530 Unspecified macular degeneration: Secondary | ICD-10-CM | POA: Diagnosis not present

## 2015-08-14 DIAGNOSIS — R0600 Dyspnea, unspecified: Secondary | ICD-10-CM | POA: Diagnosis not present

## 2015-08-14 DIAGNOSIS — N952 Postmenopausal atrophic vaginitis: Secondary | ICD-10-CM | POA: Diagnosis not present

## 2015-08-14 DIAGNOSIS — R471 Dysarthria and anarthria: Secondary | ICD-10-CM | POA: Diagnosis not present

## 2015-08-14 DIAGNOSIS — R1310 Dysphagia, unspecified: Secondary | ICD-10-CM | POA: Diagnosis not present

## 2015-08-14 DIAGNOSIS — E46 Unspecified protein-calorie malnutrition: Secondary | ICD-10-CM | POA: Diagnosis not present

## 2015-08-14 DIAGNOSIS — N3281 Overactive bladder: Secondary | ICD-10-CM | POA: Diagnosis not present

## 2015-08-14 DIAGNOSIS — J309 Allergic rhinitis, unspecified: Secondary | ICD-10-CM | POA: Diagnosis not present

## 2015-08-14 DIAGNOSIS — E785 Hyperlipidemia, unspecified: Secondary | ICD-10-CM | POA: Diagnosis not present

## 2015-08-14 DIAGNOSIS — M2450 Contracture, unspecified joint: Secondary | ICD-10-CM | POA: Diagnosis not present

## 2015-08-14 DIAGNOSIS — C4490 Unspecified malignant neoplasm of skin, unspecified: Secondary | ICD-10-CM | POA: Diagnosis not present

## 2015-08-14 DIAGNOSIS — I10 Essential (primary) hypertension: Secondary | ICD-10-CM | POA: Diagnosis not present

## 2015-08-14 DIAGNOSIS — G1221 Amyotrophic lateral sclerosis: Secondary | ICD-10-CM | POA: Diagnosis not present

## 2015-08-15 DIAGNOSIS — I10 Essential (primary) hypertension: Secondary | ICD-10-CM | POA: Diagnosis not present

## 2015-08-15 DIAGNOSIS — E785 Hyperlipidemia, unspecified: Secondary | ICD-10-CM | POA: Diagnosis not present

## 2015-08-15 DIAGNOSIS — N952 Postmenopausal atrophic vaginitis: Secondary | ICD-10-CM | POA: Diagnosis not present

## 2015-08-15 DIAGNOSIS — R0600 Dyspnea, unspecified: Secondary | ICD-10-CM | POA: Diagnosis not present

## 2015-08-15 DIAGNOSIS — E46 Unspecified protein-calorie malnutrition: Secondary | ICD-10-CM | POA: Diagnosis not present

## 2015-08-15 DIAGNOSIS — N3281 Overactive bladder: Secondary | ICD-10-CM | POA: Diagnosis not present

## 2015-08-15 DIAGNOSIS — R1310 Dysphagia, unspecified: Secondary | ICD-10-CM | POA: Diagnosis not present

## 2015-08-15 DIAGNOSIS — J309 Allergic rhinitis, unspecified: Secondary | ICD-10-CM | POA: Diagnosis not present

## 2015-08-15 DIAGNOSIS — M2450 Contracture, unspecified joint: Secondary | ICD-10-CM | POA: Diagnosis not present

## 2015-08-15 DIAGNOSIS — R471 Dysarthria and anarthria: Secondary | ICD-10-CM | POA: Diagnosis not present

## 2015-08-15 DIAGNOSIS — H3530 Unspecified macular degeneration: Secondary | ICD-10-CM | POA: Diagnosis not present

## 2015-08-15 DIAGNOSIS — G1221 Amyotrophic lateral sclerosis: Secondary | ICD-10-CM | POA: Diagnosis not present

## 2015-08-15 DIAGNOSIS — C4490 Unspecified malignant neoplasm of skin, unspecified: Secondary | ICD-10-CM | POA: Diagnosis not present

## 2015-08-16 DIAGNOSIS — I10 Essential (primary) hypertension: Secondary | ICD-10-CM | POA: Diagnosis not present

## 2015-08-16 DIAGNOSIS — M2450 Contracture, unspecified joint: Secondary | ICD-10-CM | POA: Diagnosis not present

## 2015-08-16 DIAGNOSIS — R471 Dysarthria and anarthria: Secondary | ICD-10-CM | POA: Diagnosis not present

## 2015-08-16 DIAGNOSIS — E785 Hyperlipidemia, unspecified: Secondary | ICD-10-CM | POA: Diagnosis not present

## 2015-08-16 DIAGNOSIS — E46 Unspecified protein-calorie malnutrition: Secondary | ICD-10-CM | POA: Diagnosis not present

## 2015-08-16 DIAGNOSIS — R1310 Dysphagia, unspecified: Secondary | ICD-10-CM | POA: Diagnosis not present

## 2015-08-16 DIAGNOSIS — H3530 Unspecified macular degeneration: Secondary | ICD-10-CM | POA: Diagnosis not present

## 2015-08-16 DIAGNOSIS — J309 Allergic rhinitis, unspecified: Secondary | ICD-10-CM | POA: Diagnosis not present

## 2015-08-16 DIAGNOSIS — R0600 Dyspnea, unspecified: Secondary | ICD-10-CM | POA: Diagnosis not present

## 2015-08-16 DIAGNOSIS — N952 Postmenopausal atrophic vaginitis: Secondary | ICD-10-CM | POA: Diagnosis not present

## 2015-08-16 DIAGNOSIS — N3281 Overactive bladder: Secondary | ICD-10-CM | POA: Diagnosis not present

## 2015-08-16 DIAGNOSIS — C4490 Unspecified malignant neoplasm of skin, unspecified: Secondary | ICD-10-CM | POA: Diagnosis not present

## 2015-08-16 DIAGNOSIS — G1221 Amyotrophic lateral sclerosis: Secondary | ICD-10-CM | POA: Diagnosis not present

## 2015-08-17 DIAGNOSIS — E46 Unspecified protein-calorie malnutrition: Secondary | ICD-10-CM | POA: Diagnosis not present

## 2015-08-17 DIAGNOSIS — E785 Hyperlipidemia, unspecified: Secondary | ICD-10-CM | POA: Diagnosis not present

## 2015-08-17 DIAGNOSIS — C4490 Unspecified malignant neoplasm of skin, unspecified: Secondary | ICD-10-CM | POA: Diagnosis not present

## 2015-08-17 DIAGNOSIS — M2450 Contracture, unspecified joint: Secondary | ICD-10-CM | POA: Diagnosis not present

## 2015-08-17 DIAGNOSIS — N3281 Overactive bladder: Secondary | ICD-10-CM | POA: Diagnosis not present

## 2015-08-17 DIAGNOSIS — G1221 Amyotrophic lateral sclerosis: Secondary | ICD-10-CM | POA: Diagnosis not present

## 2015-08-17 DIAGNOSIS — J309 Allergic rhinitis, unspecified: Secondary | ICD-10-CM | POA: Diagnosis not present

## 2015-08-17 DIAGNOSIS — H3530 Unspecified macular degeneration: Secondary | ICD-10-CM | POA: Diagnosis not present

## 2015-08-17 DIAGNOSIS — R0600 Dyspnea, unspecified: Secondary | ICD-10-CM | POA: Diagnosis not present

## 2015-08-17 DIAGNOSIS — R471 Dysarthria and anarthria: Secondary | ICD-10-CM | POA: Diagnosis not present

## 2015-08-17 DIAGNOSIS — N952 Postmenopausal atrophic vaginitis: Secondary | ICD-10-CM | POA: Diagnosis not present

## 2015-08-17 DIAGNOSIS — I10 Essential (primary) hypertension: Secondary | ICD-10-CM | POA: Diagnosis not present

## 2015-08-17 DIAGNOSIS — R1310 Dysphagia, unspecified: Secondary | ICD-10-CM | POA: Diagnosis not present

## 2015-08-18 DIAGNOSIS — M2450 Contracture, unspecified joint: Secondary | ICD-10-CM | POA: Diagnosis not present

## 2015-08-18 DIAGNOSIS — E46 Unspecified protein-calorie malnutrition: Secondary | ICD-10-CM | POA: Diagnosis not present

## 2015-08-18 DIAGNOSIS — H3530 Unspecified macular degeneration: Secondary | ICD-10-CM | POA: Diagnosis not present

## 2015-08-18 DIAGNOSIS — N952 Postmenopausal atrophic vaginitis: Secondary | ICD-10-CM | POA: Diagnosis not present

## 2015-08-18 DIAGNOSIS — G1221 Amyotrophic lateral sclerosis: Secondary | ICD-10-CM | POA: Diagnosis not present

## 2015-08-18 DIAGNOSIS — R1310 Dysphagia, unspecified: Secondary | ICD-10-CM | POA: Diagnosis not present

## 2015-08-18 DIAGNOSIS — C4490 Unspecified malignant neoplasm of skin, unspecified: Secondary | ICD-10-CM | POA: Diagnosis not present

## 2015-08-18 DIAGNOSIS — J309 Allergic rhinitis, unspecified: Secondary | ICD-10-CM | POA: Diagnosis not present

## 2015-08-18 DIAGNOSIS — R0600 Dyspnea, unspecified: Secondary | ICD-10-CM | POA: Diagnosis not present

## 2015-08-18 DIAGNOSIS — N3281 Overactive bladder: Secondary | ICD-10-CM | POA: Diagnosis not present

## 2015-08-18 DIAGNOSIS — I10 Essential (primary) hypertension: Secondary | ICD-10-CM | POA: Diagnosis not present

## 2015-08-18 DIAGNOSIS — R471 Dysarthria and anarthria: Secondary | ICD-10-CM | POA: Diagnosis not present

## 2015-08-18 DIAGNOSIS — E785 Hyperlipidemia, unspecified: Secondary | ICD-10-CM | POA: Diagnosis not present

## 2015-08-19 DIAGNOSIS — N3281 Overactive bladder: Secondary | ICD-10-CM | POA: Diagnosis not present

## 2015-08-19 DIAGNOSIS — M2450 Contracture, unspecified joint: Secondary | ICD-10-CM | POA: Diagnosis not present

## 2015-08-19 DIAGNOSIS — R471 Dysarthria and anarthria: Secondary | ICD-10-CM | POA: Diagnosis not present

## 2015-08-19 DIAGNOSIS — I10 Essential (primary) hypertension: Secondary | ICD-10-CM | POA: Diagnosis not present

## 2015-08-19 DIAGNOSIS — E785 Hyperlipidemia, unspecified: Secondary | ICD-10-CM | POA: Diagnosis not present

## 2015-08-19 DIAGNOSIS — N952 Postmenopausal atrophic vaginitis: Secondary | ICD-10-CM | POA: Diagnosis not present

## 2015-08-19 DIAGNOSIS — R1310 Dysphagia, unspecified: Secondary | ICD-10-CM | POA: Diagnosis not present

## 2015-08-19 DIAGNOSIS — H3530 Unspecified macular degeneration: Secondary | ICD-10-CM | POA: Diagnosis not present

## 2015-08-19 DIAGNOSIS — G1221 Amyotrophic lateral sclerosis: Secondary | ICD-10-CM | POA: Diagnosis not present

## 2015-08-19 DIAGNOSIS — R0600 Dyspnea, unspecified: Secondary | ICD-10-CM | POA: Diagnosis not present

## 2015-08-19 DIAGNOSIS — J309 Allergic rhinitis, unspecified: Secondary | ICD-10-CM | POA: Diagnosis not present

## 2015-08-19 DIAGNOSIS — C4490 Unspecified malignant neoplasm of skin, unspecified: Secondary | ICD-10-CM | POA: Diagnosis not present

## 2015-08-19 DIAGNOSIS — E46 Unspecified protein-calorie malnutrition: Secondary | ICD-10-CM | POA: Diagnosis not present

## 2015-08-20 DIAGNOSIS — I10 Essential (primary) hypertension: Secondary | ICD-10-CM | POA: Diagnosis not present

## 2015-08-20 DIAGNOSIS — N952 Postmenopausal atrophic vaginitis: Secondary | ICD-10-CM | POA: Diagnosis not present

## 2015-08-20 DIAGNOSIS — R0600 Dyspnea, unspecified: Secondary | ICD-10-CM | POA: Diagnosis not present

## 2015-08-20 DIAGNOSIS — H3530 Unspecified macular degeneration: Secondary | ICD-10-CM | POA: Diagnosis not present

## 2015-08-20 DIAGNOSIS — M2450 Contracture, unspecified joint: Secondary | ICD-10-CM | POA: Diagnosis not present

## 2015-08-20 DIAGNOSIS — J309 Allergic rhinitis, unspecified: Secondary | ICD-10-CM | POA: Diagnosis not present

## 2015-08-20 DIAGNOSIS — N3281 Overactive bladder: Secondary | ICD-10-CM | POA: Diagnosis not present

## 2015-08-20 DIAGNOSIS — R1310 Dysphagia, unspecified: Secondary | ICD-10-CM | POA: Diagnosis not present

## 2015-08-20 DIAGNOSIS — E46 Unspecified protein-calorie malnutrition: Secondary | ICD-10-CM | POA: Diagnosis not present

## 2015-08-20 DIAGNOSIS — R471 Dysarthria and anarthria: Secondary | ICD-10-CM | POA: Diagnosis not present

## 2015-08-20 DIAGNOSIS — G1221 Amyotrophic lateral sclerosis: Secondary | ICD-10-CM | POA: Diagnosis not present

## 2015-08-20 DIAGNOSIS — C4490 Unspecified malignant neoplasm of skin, unspecified: Secondary | ICD-10-CM | POA: Diagnosis not present

## 2015-08-20 DIAGNOSIS — E785 Hyperlipidemia, unspecified: Secondary | ICD-10-CM | POA: Diagnosis not present

## 2015-08-21 DIAGNOSIS — N952 Postmenopausal atrophic vaginitis: Secondary | ICD-10-CM | POA: Diagnosis not present

## 2015-08-21 DIAGNOSIS — E46 Unspecified protein-calorie malnutrition: Secondary | ICD-10-CM | POA: Diagnosis not present

## 2015-08-21 DIAGNOSIS — C4490 Unspecified malignant neoplasm of skin, unspecified: Secondary | ICD-10-CM | POA: Diagnosis not present

## 2015-08-21 DIAGNOSIS — R0600 Dyspnea, unspecified: Secondary | ICD-10-CM | POA: Diagnosis not present

## 2015-08-21 DIAGNOSIS — H3530 Unspecified macular degeneration: Secondary | ICD-10-CM | POA: Diagnosis not present

## 2015-08-21 DIAGNOSIS — R471 Dysarthria and anarthria: Secondary | ICD-10-CM | POA: Diagnosis not present

## 2015-08-21 DIAGNOSIS — G1221 Amyotrophic lateral sclerosis: Secondary | ICD-10-CM | POA: Diagnosis not present

## 2015-08-21 DIAGNOSIS — J309 Allergic rhinitis, unspecified: Secondary | ICD-10-CM | POA: Diagnosis not present

## 2015-08-21 DIAGNOSIS — E785 Hyperlipidemia, unspecified: Secondary | ICD-10-CM | POA: Diagnosis not present

## 2015-08-21 DIAGNOSIS — M2450 Contracture, unspecified joint: Secondary | ICD-10-CM | POA: Diagnosis not present

## 2015-08-21 DIAGNOSIS — R1310 Dysphagia, unspecified: Secondary | ICD-10-CM | POA: Diagnosis not present

## 2015-08-21 DIAGNOSIS — I10 Essential (primary) hypertension: Secondary | ICD-10-CM | POA: Diagnosis not present

## 2015-08-21 DIAGNOSIS — N3281 Overactive bladder: Secondary | ICD-10-CM | POA: Diagnosis not present

## 2015-08-22 DIAGNOSIS — C4490 Unspecified malignant neoplasm of skin, unspecified: Secondary | ICD-10-CM | POA: Diagnosis not present

## 2015-08-22 DIAGNOSIS — E46 Unspecified protein-calorie malnutrition: Secondary | ICD-10-CM | POA: Diagnosis not present

## 2015-08-22 DIAGNOSIS — N952 Postmenopausal atrophic vaginitis: Secondary | ICD-10-CM | POA: Diagnosis not present

## 2015-08-22 DIAGNOSIS — J309 Allergic rhinitis, unspecified: Secondary | ICD-10-CM | POA: Diagnosis not present

## 2015-08-22 DIAGNOSIS — R1310 Dysphagia, unspecified: Secondary | ICD-10-CM | POA: Diagnosis not present

## 2015-08-22 DIAGNOSIS — H3530 Unspecified macular degeneration: Secondary | ICD-10-CM | POA: Diagnosis not present

## 2015-08-22 DIAGNOSIS — N3281 Overactive bladder: Secondary | ICD-10-CM | POA: Diagnosis not present

## 2015-08-22 DIAGNOSIS — R0600 Dyspnea, unspecified: Secondary | ICD-10-CM | POA: Diagnosis not present

## 2015-08-22 DIAGNOSIS — R471 Dysarthria and anarthria: Secondary | ICD-10-CM | POA: Diagnosis not present

## 2015-08-22 DIAGNOSIS — G1221 Amyotrophic lateral sclerosis: Secondary | ICD-10-CM | POA: Diagnosis not present

## 2015-08-22 DIAGNOSIS — I10 Essential (primary) hypertension: Secondary | ICD-10-CM | POA: Diagnosis not present

## 2015-08-22 DIAGNOSIS — M2450 Contracture, unspecified joint: Secondary | ICD-10-CM | POA: Diagnosis not present

## 2015-08-22 DIAGNOSIS — E785 Hyperlipidemia, unspecified: Secondary | ICD-10-CM | POA: Diagnosis not present

## 2015-08-23 DIAGNOSIS — E46 Unspecified protein-calorie malnutrition: Secondary | ICD-10-CM | POA: Diagnosis not present

## 2015-08-23 DIAGNOSIS — M2450 Contracture, unspecified joint: Secondary | ICD-10-CM | POA: Diagnosis not present

## 2015-08-23 DIAGNOSIS — N3281 Overactive bladder: Secondary | ICD-10-CM | POA: Diagnosis not present

## 2015-08-23 DIAGNOSIS — R471 Dysarthria and anarthria: Secondary | ICD-10-CM | POA: Diagnosis not present

## 2015-08-23 DIAGNOSIS — C4490 Unspecified malignant neoplasm of skin, unspecified: Secondary | ICD-10-CM | POA: Diagnosis not present

## 2015-08-23 DIAGNOSIS — H3530 Unspecified macular degeneration: Secondary | ICD-10-CM | POA: Diagnosis not present

## 2015-08-23 DIAGNOSIS — R0600 Dyspnea, unspecified: Secondary | ICD-10-CM | POA: Diagnosis not present

## 2015-08-23 DIAGNOSIS — G1221 Amyotrophic lateral sclerosis: Secondary | ICD-10-CM | POA: Diagnosis not present

## 2015-08-23 DIAGNOSIS — N952 Postmenopausal atrophic vaginitis: Secondary | ICD-10-CM | POA: Diagnosis not present

## 2015-08-23 DIAGNOSIS — R1310 Dysphagia, unspecified: Secondary | ICD-10-CM | POA: Diagnosis not present

## 2015-08-23 DIAGNOSIS — I10 Essential (primary) hypertension: Secondary | ICD-10-CM | POA: Diagnosis not present

## 2015-08-23 DIAGNOSIS — J309 Allergic rhinitis, unspecified: Secondary | ICD-10-CM | POA: Diagnosis not present

## 2015-08-23 DIAGNOSIS — E785 Hyperlipidemia, unspecified: Secondary | ICD-10-CM | POA: Diagnosis not present

## 2015-08-24 DIAGNOSIS — E46 Unspecified protein-calorie malnutrition: Secondary | ICD-10-CM | POA: Diagnosis not present

## 2015-08-24 DIAGNOSIS — I10 Essential (primary) hypertension: Secondary | ICD-10-CM | POA: Diagnosis not present

## 2015-08-24 DIAGNOSIS — R1310 Dysphagia, unspecified: Secondary | ICD-10-CM | POA: Diagnosis not present

## 2015-08-24 DIAGNOSIS — C4490 Unspecified malignant neoplasm of skin, unspecified: Secondary | ICD-10-CM | POA: Diagnosis not present

## 2015-08-24 DIAGNOSIS — G1221 Amyotrophic lateral sclerosis: Secondary | ICD-10-CM | POA: Diagnosis not present

## 2015-08-24 DIAGNOSIS — R0600 Dyspnea, unspecified: Secondary | ICD-10-CM | POA: Diagnosis not present

## 2015-08-24 DIAGNOSIS — N952 Postmenopausal atrophic vaginitis: Secondary | ICD-10-CM | POA: Diagnosis not present

## 2015-08-24 DIAGNOSIS — N3281 Overactive bladder: Secondary | ICD-10-CM | POA: Diagnosis not present

## 2015-08-24 DIAGNOSIS — H3530 Unspecified macular degeneration: Secondary | ICD-10-CM | POA: Diagnosis not present

## 2015-08-24 DIAGNOSIS — E785 Hyperlipidemia, unspecified: Secondary | ICD-10-CM | POA: Diagnosis not present

## 2015-08-24 DIAGNOSIS — J309 Allergic rhinitis, unspecified: Secondary | ICD-10-CM | POA: Diagnosis not present

## 2015-08-24 DIAGNOSIS — M2450 Contracture, unspecified joint: Secondary | ICD-10-CM | POA: Diagnosis not present

## 2015-08-24 DIAGNOSIS — R471 Dysarthria and anarthria: Secondary | ICD-10-CM | POA: Diagnosis not present

## 2015-08-25 DIAGNOSIS — R1310 Dysphagia, unspecified: Secondary | ICD-10-CM | POA: Diagnosis not present

## 2015-08-25 DIAGNOSIS — H3530 Unspecified macular degeneration: Secondary | ICD-10-CM | POA: Diagnosis not present

## 2015-08-25 DIAGNOSIS — C4490 Unspecified malignant neoplasm of skin, unspecified: Secondary | ICD-10-CM | POA: Diagnosis not present

## 2015-08-25 DIAGNOSIS — E46 Unspecified protein-calorie malnutrition: Secondary | ICD-10-CM | POA: Diagnosis not present

## 2015-08-25 DIAGNOSIS — N952 Postmenopausal atrophic vaginitis: Secondary | ICD-10-CM | POA: Diagnosis not present

## 2015-08-25 DIAGNOSIS — J309 Allergic rhinitis, unspecified: Secondary | ICD-10-CM | POA: Diagnosis not present

## 2015-08-25 DIAGNOSIS — I10 Essential (primary) hypertension: Secondary | ICD-10-CM | POA: Diagnosis not present

## 2015-08-25 DIAGNOSIS — R0600 Dyspnea, unspecified: Secondary | ICD-10-CM | POA: Diagnosis not present

## 2015-08-25 DIAGNOSIS — N3281 Overactive bladder: Secondary | ICD-10-CM | POA: Diagnosis not present

## 2015-08-25 DIAGNOSIS — E785 Hyperlipidemia, unspecified: Secondary | ICD-10-CM | POA: Diagnosis not present

## 2015-08-25 DIAGNOSIS — R471 Dysarthria and anarthria: Secondary | ICD-10-CM | POA: Diagnosis not present

## 2015-08-25 DIAGNOSIS — M2450 Contracture, unspecified joint: Secondary | ICD-10-CM | POA: Diagnosis not present

## 2015-08-25 DIAGNOSIS — G1221 Amyotrophic lateral sclerosis: Secondary | ICD-10-CM | POA: Diagnosis not present

## 2015-08-26 DIAGNOSIS — N3281 Overactive bladder: Secondary | ICD-10-CM | POA: Diagnosis not present

## 2015-08-26 DIAGNOSIS — R0600 Dyspnea, unspecified: Secondary | ICD-10-CM | POA: Diagnosis not present

## 2015-08-26 DIAGNOSIS — E785 Hyperlipidemia, unspecified: Secondary | ICD-10-CM | POA: Diagnosis not present

## 2015-08-26 DIAGNOSIS — R1310 Dysphagia, unspecified: Secondary | ICD-10-CM | POA: Diagnosis not present

## 2015-08-26 DIAGNOSIS — J309 Allergic rhinitis, unspecified: Secondary | ICD-10-CM | POA: Diagnosis not present

## 2015-08-26 DIAGNOSIS — M2450 Contracture, unspecified joint: Secondary | ICD-10-CM | POA: Diagnosis not present

## 2015-08-26 DIAGNOSIS — G1221 Amyotrophic lateral sclerosis: Secondary | ICD-10-CM | POA: Diagnosis not present

## 2015-08-26 DIAGNOSIS — N952 Postmenopausal atrophic vaginitis: Secondary | ICD-10-CM | POA: Diagnosis not present

## 2015-08-26 DIAGNOSIS — C4490 Unspecified malignant neoplasm of skin, unspecified: Secondary | ICD-10-CM | POA: Diagnosis not present

## 2015-08-26 DIAGNOSIS — R471 Dysarthria and anarthria: Secondary | ICD-10-CM | POA: Diagnosis not present

## 2015-08-26 DIAGNOSIS — H3530 Unspecified macular degeneration: Secondary | ICD-10-CM | POA: Diagnosis not present

## 2015-08-26 DIAGNOSIS — E46 Unspecified protein-calorie malnutrition: Secondary | ICD-10-CM | POA: Diagnosis not present

## 2015-08-26 DIAGNOSIS — I10 Essential (primary) hypertension: Secondary | ICD-10-CM | POA: Diagnosis not present

## 2015-08-27 ENCOUNTER — Non-Acute Institutional Stay (SKILLED_NURSING_FACILITY): Payer: Medicare Other | Admitting: Adult Health

## 2015-08-27 ENCOUNTER — Encounter: Payer: Self-pay | Admitting: Adult Health

## 2015-08-27 DIAGNOSIS — S82291S Other fracture of shaft of right tibia, sequela: Secondary | ICD-10-CM

## 2015-08-27 DIAGNOSIS — E538 Deficiency of other specified B group vitamins: Secondary | ICD-10-CM | POA: Diagnosis not present

## 2015-08-27 DIAGNOSIS — I10 Essential (primary) hypertension: Secondary | ICD-10-CM | POA: Diagnosis not present

## 2015-08-27 DIAGNOSIS — F419 Anxiety disorder, unspecified: Secondary | ICD-10-CM | POA: Diagnosis not present

## 2015-08-27 DIAGNOSIS — J309 Allergic rhinitis, unspecified: Secondary | ICD-10-CM | POA: Diagnosis not present

## 2015-08-27 DIAGNOSIS — E785 Hyperlipidemia, unspecified: Secondary | ICD-10-CM | POA: Diagnosis not present

## 2015-08-27 DIAGNOSIS — G629 Polyneuropathy, unspecified: Secondary | ICD-10-CM

## 2015-08-27 DIAGNOSIS — H3530 Unspecified macular degeneration: Secondary | ICD-10-CM | POA: Diagnosis not present

## 2015-08-27 DIAGNOSIS — N3281 Overactive bladder: Secondary | ICD-10-CM | POA: Diagnosis not present

## 2015-08-27 DIAGNOSIS — R0600 Dyspnea, unspecified: Secondary | ICD-10-CM | POA: Diagnosis not present

## 2015-08-27 DIAGNOSIS — M2450 Contracture, unspecified joint: Secondary | ICD-10-CM | POA: Diagnosis not present

## 2015-08-27 DIAGNOSIS — G4700 Insomnia, unspecified: Secondary | ICD-10-CM | POA: Diagnosis not present

## 2015-08-27 DIAGNOSIS — N952 Postmenopausal atrophic vaginitis: Secondary | ICD-10-CM | POA: Diagnosis not present

## 2015-08-27 DIAGNOSIS — R471 Dysarthria and anarthria: Secondary | ICD-10-CM | POA: Diagnosis not present

## 2015-08-27 DIAGNOSIS — E46 Unspecified protein-calorie malnutrition: Secondary | ICD-10-CM | POA: Diagnosis not present

## 2015-08-27 DIAGNOSIS — C4490 Unspecified malignant neoplasm of skin, unspecified: Secondary | ICD-10-CM | POA: Diagnosis not present

## 2015-08-27 DIAGNOSIS — K219 Gastro-esophageal reflux disease without esophagitis: Secondary | ICD-10-CM

## 2015-08-27 DIAGNOSIS — R1310 Dysphagia, unspecified: Secondary | ICD-10-CM | POA: Diagnosis not present

## 2015-08-27 DIAGNOSIS — Z79890 Hormone replacement therapy (postmenopausal): Secondary | ICD-10-CM | POA: Diagnosis not present

## 2015-08-27 DIAGNOSIS — G1221 Amyotrophic lateral sclerosis: Secondary | ICD-10-CM

## 2015-08-27 DIAGNOSIS — K5900 Constipation, unspecified: Secondary | ICD-10-CM | POA: Diagnosis not present

## 2015-08-27 NOTE — Progress Notes (Signed)
Patient ID: Ashley Vaughan, female   DOB: [birth year], 82 y.o.   MRN: [id number]    DATE:    08/27/15  Facility:  Nursing Home Location:  [place] Room Number: [id number] LEVEL OF CARE:  SNF (31)   Chief Complaint  Patient presents with  . Medical Management of Chronic Issues    HISTORY OF PRESENT ILLNESS:  This is an 82 year old female who is being seen for a routine visit. She is comfort care/hospice and followed-up by [facility]. She was recently started on Neurontin 100 mg PO Q HS for neuropathy on lower extremities. Her mood is stable and currently takes Ativan Q HS. No complaints of insomnia at this time.   She has PMH of hypertension, osteoporosis, migraine, hyperlipidemia, ALS, vitamin B12 deficiency, macular degeneration and right hearing loss. She had a fall landing on her bilateral knees. Orthopedics was consulted and recommended nonsurgical intervention. She was diagnosed with ALS in 2014 and has progressed since then.   PAST MEDICAL HISTORY:  Past Medical History  Diagnosis Date  . Hypertension   . Osteoporosis   . Migraine (atenolol for prophylaxis)  . Macular degeneration   . Kidney stones, calcium oxalate 1993  . Pulmonary hypertension (HCC) mild,mild AR,mild-mod MR,mild-mod TR  . Hematuria, microscopic DrKimbrough 1993  . Hiatal hernia   . Diverticulosis (L sided) 1994  . Hyperlipidemia   . BCC (basal cell carcinoma of skin) L cheek-DrLupton (6/09)  . Postmenopausal bleeding 2010-DrCole  . Allergy     s/p immunotherapy  . Mitral regurgitation     mild-mod; mild aortic regurgitation, and mild-mod TR  . Atrophic vaginitis   . Hearing loss     right ear  . B12 deficiency   . ALS (amyotrophic lateral sclerosis) ([place])     CURRENT MEDICATIONS: Reviewed per MAR/see medication list   Medication List       This list is accurate as of: 08/27/15 11:59 PM.  Always use your most recent med list.               acetaminophen 325 MG tablet   Commonly known as:  TYLENOL  Take 650 mg by mouth every 4 (four) hours as needed for mild pain or fever (Fever >100).     amLODipine 5 MG tablet  Commonly known as:  NORVASC  Take 1 tablet (5 mg total) by mouth daily.     aspirin 81 MG tablet  Take 81 mg by mouth at bedtime.     B-12 5000 MCG Subl  Place 1 tablet under the tongue daily.     estrogens (conjugated) 0.625 MG tablet  Commonly known as:  PREMARIN  Place 0.625 mg vaginally 2 (two) times a week. Insert half gram vaginally     gabapentin 100 MG capsule  Commonly known as:  NEURONTIN  Take 100 mg by mouth at bedtime.     HYDROcodone-acetaminophen 5-325 MG tablet  Commonly known as:  NORCO/VICODIN  Take 1 tablet by mouth every 4 (four) hours as needed for moderate pain.     HYDROcodone-acetaminophen 5-325 MG tablet  Commonly known as:  NORCO/VICODIN  Take 1 tablet by mouth at bedtime. Do not exceed 4 gm of Tylenol in 24 hours     LORazepam 0.5 MG tablet  Commonly known as:  ATIVAN  Take one tablet by mouth every night at bedtime for anxiety     Melatonin 5 MG Tabs  Take 5 mg by mouth at bedtime.  mirabegron ER 25 MG Tb24 tablet  Commonly known as:  MYRBETRIQ  Take 25 mg by mouth at bedtime.     OCUVITE EYE + MULTI Tabs  Take 1 tablet by mouth daily.     omeprazole 20 MG capsule  Commonly known as:  PRILOSEC  Take 20 mg by mouth daily.     polyethylene glycol packet  Commonly known as:  MIRALAX / GLYCOLAX  Take 17 g by mouth daily.     sodium chloride 0.65 % Soln nasal spray  Commonly known as:  OCEAN  Place 2 sprays into both nostrils as needed for congestion.     UNABLE TO FIND  Med Name: Med Pass 90 mL by mouth twice daily for nutritional support             Allergies  Allergen Reactions  . Codeine Other (See Comments)    unknown  . Lexapro [Escitalopram] Nausea Only  . Macrobid [Nitrofurantoin Monohydrate Macrocrystals] Nausea Only    Nausea and headache  . Sulfa Antibiotics  Other (See Comments)    dizziness     REVIEW OF SYSTEMS:  GENERAL: no change in appetite, no fatigue, no weight changes, no fever, chills or weakness RESPIRATORY: no cough, SOB, DOE, wheezing, hemoptysis CARDIAC: no chest pain, edema or palpitations GI: no abdominal pain, diarrhea, constipation, heart burn, nausea or vomiting  PHYSICAL EXAMINATION  GENERAL: no acute distress, normal body habitus SKIN:  Skin is warm and dry. EYES: conjunctivae normal, sclerae normal, normal eye lids NECK: supple, trachea midline, no neck masses, no thyroid tenderness, no thyromegaly LYMPHATICS: no LAN in the neck, no supraclavicular LAN RESPIRATORY: breathing is even & unlabored, BS CTAB CARDIAC: RRR, no murmur,no extra heart sounds, no edema GI: abdomen soft, normal BS, no masses, no tenderness, no hepatomegaly, no splenomegaly EXTREMITIES:  Able to move 4 extremities; generalized weakness BLE; contracted fingers on bilateral hands; refusing to wear splints since she feels tied down PSYCHIATRIC: the patient is alert & oriented to person, affect & behavior appropriate  LABS/RADIOLOGY:  Labs reviewed: Basic Metabolic Panel:  Recent Labs  11/30/14 1541 12/11/14 05/01/15 08/06/15  NA 139 142 143 143  K 3.6 4.2 3.9 3.7  CL 102  --   --   --   GLUCOSE 138*  --   --   --   BUN 15 11 14 15   CREATININE 0.70 0.4* 0.4* 0.4*   Liver Function Tests:  Recent Labs  05/01/15 08/06/15  AST 10* 10*  ALT 7 7  ALKPHOS 58 55   CBC:  Recent Labs  11/30/14 1528  12/25/14 05/01/15 08/06/15  WBC 9.3  < > 5.1 5.0 5.9  NEUTROABS  --   --   --   --  3  HGB 11.8*  < > 10.6* 11.9* 12.7  HCT 35.6*  < > 33* 37 38  MCV 94.7  --   --   --   --   PLT 186  < > 250 206 186  < > = values in this interval not displayed.  12/25/14  WBC 5.1 hemoglobin 10.6 hematocrit 32.9 MCV 92.7 platelet 250 12/11/14  WBC 5.8 hemoglobin 8.6 hematocrit 26.4 MCV 93.0 platelet 314 sodium 142 potassium 4.2 glucose 93 BUN 11  creatinine 0.43 calcium 8.4     ASSESSMENT/PLAN:  Neuropathy - continue Neurontin 100 mg 1 capsule Q HS   ALS  - on hospice/comfort care  Hypertension  - continue Norvasc 5 mg 1 tab by mouth daily;  check CMP   Vitamin B12 deficiency - continue vitamin B12 5000 g 1 tab SL Q D  Anxiety - mood is stable; continue Ativan 0.5 mg 1 tab by mouth daily at bedtime  GERD - continue Prilosec 20 mg 1 capsule by mouth daily  Closed right tibia  Fracture -  continue Norco 5/325 mg 1 tab by mouth daily at bedtime and every 4 hours when necessary for pain  HRT - continue Premarin vaginal cream apply 1/2 gm vaginally twice weekly   Urinary frequency - change Myrbetriq  25 mg 1 tab by mouth Q HS instead of AM; check CBC  Constipation - continue  MiraLAX 8.5 g by mouth @ HS  Insomnia - continue melatonin to 5 mg 1 tab by mouth daily at bedtime      Goals of care:  Long-term care/hospice    [facility], NP [place]

## 2015-08-28 DIAGNOSIS — C4490 Unspecified malignant neoplasm of skin, unspecified: Secondary | ICD-10-CM | POA: Diagnosis not present

## 2015-08-28 DIAGNOSIS — E46 Unspecified protein-calorie malnutrition: Secondary | ICD-10-CM | POA: Diagnosis not present

## 2015-08-28 DIAGNOSIS — N3281 Overactive bladder: Secondary | ICD-10-CM | POA: Diagnosis not present

## 2015-08-28 DIAGNOSIS — R0600 Dyspnea, unspecified: Secondary | ICD-10-CM | POA: Diagnosis not present

## 2015-08-28 DIAGNOSIS — R1310 Dysphagia, unspecified: Secondary | ICD-10-CM | POA: Diagnosis not present

## 2015-08-28 DIAGNOSIS — H3530 Unspecified macular degeneration: Secondary | ICD-10-CM | POA: Diagnosis not present

## 2015-08-28 DIAGNOSIS — N952 Postmenopausal atrophic vaginitis: Secondary | ICD-10-CM | POA: Diagnosis not present

## 2015-08-28 DIAGNOSIS — G1221 Amyotrophic lateral sclerosis: Secondary | ICD-10-CM | POA: Diagnosis not present

## 2015-08-28 DIAGNOSIS — M2450 Contracture, unspecified joint: Secondary | ICD-10-CM | POA: Diagnosis not present

## 2015-08-28 DIAGNOSIS — R471 Dysarthria and anarthria: Secondary | ICD-10-CM | POA: Diagnosis not present

## 2015-08-28 DIAGNOSIS — I10 Essential (primary) hypertension: Secondary | ICD-10-CM | POA: Diagnosis not present

## 2015-08-28 DIAGNOSIS — J309 Allergic rhinitis, unspecified: Secondary | ICD-10-CM | POA: Diagnosis not present

## 2015-08-28 DIAGNOSIS — E785 Hyperlipidemia, unspecified: Secondary | ICD-10-CM | POA: Diagnosis not present

## 2015-08-29 DIAGNOSIS — G1221 Amyotrophic lateral sclerosis: Secondary | ICD-10-CM | POA: Diagnosis not present

## 2015-08-29 DIAGNOSIS — C4490 Unspecified malignant neoplasm of skin, unspecified: Secondary | ICD-10-CM | POA: Diagnosis not present

## 2015-08-29 DIAGNOSIS — N952 Postmenopausal atrophic vaginitis: Secondary | ICD-10-CM | POA: Diagnosis not present

## 2015-08-29 DIAGNOSIS — E785 Hyperlipidemia, unspecified: Secondary | ICD-10-CM | POA: Diagnosis not present

## 2015-08-29 DIAGNOSIS — R1310 Dysphagia, unspecified: Secondary | ICD-10-CM | POA: Diagnosis not present

## 2015-08-29 DIAGNOSIS — E46 Unspecified protein-calorie malnutrition: Secondary | ICD-10-CM | POA: Diagnosis not present

## 2015-08-29 DIAGNOSIS — H3530 Unspecified macular degeneration: Secondary | ICD-10-CM | POA: Diagnosis not present

## 2015-08-29 DIAGNOSIS — N3281 Overactive bladder: Secondary | ICD-10-CM | POA: Diagnosis not present

## 2015-08-29 DIAGNOSIS — R0600 Dyspnea, unspecified: Secondary | ICD-10-CM | POA: Diagnosis not present

## 2015-08-29 DIAGNOSIS — I10 Essential (primary) hypertension: Secondary | ICD-10-CM | POA: Diagnosis not present

## 2015-08-29 DIAGNOSIS — J309 Allergic rhinitis, unspecified: Secondary | ICD-10-CM | POA: Diagnosis not present

## 2015-08-29 DIAGNOSIS — R471 Dysarthria and anarthria: Secondary | ICD-10-CM | POA: Diagnosis not present

## 2015-08-29 DIAGNOSIS — M2450 Contracture, unspecified joint: Secondary | ICD-10-CM | POA: Diagnosis not present

## 2015-08-30 DIAGNOSIS — J309 Allergic rhinitis, unspecified: Secondary | ICD-10-CM | POA: Diagnosis not present

## 2015-08-30 DIAGNOSIS — N3281 Overactive bladder: Secondary | ICD-10-CM | POA: Diagnosis not present

## 2015-08-30 DIAGNOSIS — H3530 Unspecified macular degeneration: Secondary | ICD-10-CM | POA: Diagnosis not present

## 2015-08-30 DIAGNOSIS — E785 Hyperlipidemia, unspecified: Secondary | ICD-10-CM | POA: Diagnosis not present

## 2015-08-30 DIAGNOSIS — C4490 Unspecified malignant neoplasm of skin, unspecified: Secondary | ICD-10-CM | POA: Diagnosis not present

## 2015-08-30 DIAGNOSIS — G1221 Amyotrophic lateral sclerosis: Secondary | ICD-10-CM | POA: Diagnosis not present

## 2015-08-30 DIAGNOSIS — M2450 Contracture, unspecified joint: Secondary | ICD-10-CM | POA: Diagnosis not present

## 2015-08-30 DIAGNOSIS — E46 Unspecified protein-calorie malnutrition: Secondary | ICD-10-CM | POA: Diagnosis not present

## 2015-08-30 DIAGNOSIS — I10 Essential (primary) hypertension: Secondary | ICD-10-CM | POA: Diagnosis not present

## 2015-08-30 DIAGNOSIS — N952 Postmenopausal atrophic vaginitis: Secondary | ICD-10-CM | POA: Diagnosis not present

## 2015-08-30 DIAGNOSIS — R0600 Dyspnea, unspecified: Secondary | ICD-10-CM | POA: Diagnosis not present

## 2015-08-30 DIAGNOSIS — R471 Dysarthria and anarthria: Secondary | ICD-10-CM | POA: Diagnosis not present

## 2015-08-30 DIAGNOSIS — R1310 Dysphagia, unspecified: Secondary | ICD-10-CM | POA: Diagnosis not present

## 2015-08-31 DIAGNOSIS — N952 Postmenopausal atrophic vaginitis: Secondary | ICD-10-CM | POA: Diagnosis not present

## 2015-08-31 DIAGNOSIS — J309 Allergic rhinitis, unspecified: Secondary | ICD-10-CM | POA: Diagnosis not present

## 2015-08-31 DIAGNOSIS — N3281 Overactive bladder: Secondary | ICD-10-CM | POA: Diagnosis not present

## 2015-08-31 DIAGNOSIS — E46 Unspecified protein-calorie malnutrition: Secondary | ICD-10-CM | POA: Diagnosis not present

## 2015-08-31 DIAGNOSIS — R0600 Dyspnea, unspecified: Secondary | ICD-10-CM | POA: Diagnosis not present

## 2015-08-31 DIAGNOSIS — G1221 Amyotrophic lateral sclerosis: Secondary | ICD-10-CM | POA: Diagnosis not present

## 2015-08-31 DIAGNOSIS — I10 Essential (primary) hypertension: Secondary | ICD-10-CM | POA: Diagnosis not present

## 2015-08-31 DIAGNOSIS — C4490 Unspecified malignant neoplasm of skin, unspecified: Secondary | ICD-10-CM | POA: Diagnosis not present

## 2015-08-31 DIAGNOSIS — M2450 Contracture, unspecified joint: Secondary | ICD-10-CM | POA: Diagnosis not present

## 2015-08-31 DIAGNOSIS — H3530 Unspecified macular degeneration: Secondary | ICD-10-CM | POA: Diagnosis not present

## 2015-08-31 DIAGNOSIS — R1310 Dysphagia, unspecified: Secondary | ICD-10-CM | POA: Diagnosis not present

## 2015-08-31 DIAGNOSIS — E785 Hyperlipidemia, unspecified: Secondary | ICD-10-CM | POA: Diagnosis not present

## 2015-08-31 DIAGNOSIS — R471 Dysarthria and anarthria: Secondary | ICD-10-CM | POA: Diagnosis not present

## 2015-09-01 DIAGNOSIS — I10 Essential (primary) hypertension: Secondary | ICD-10-CM | POA: Diagnosis not present

## 2015-09-01 DIAGNOSIS — E46 Unspecified protein-calorie malnutrition: Secondary | ICD-10-CM | POA: Diagnosis not present

## 2015-09-01 DIAGNOSIS — G1221 Amyotrophic lateral sclerosis: Secondary | ICD-10-CM | POA: Diagnosis not present

## 2015-09-01 DIAGNOSIS — R0600 Dyspnea, unspecified: Secondary | ICD-10-CM | POA: Diagnosis not present

## 2015-09-01 DIAGNOSIS — R339 Retention of urine, unspecified: Secondary | ICD-10-CM | POA: Diagnosis not present

## 2015-09-01 DIAGNOSIS — R471 Dysarthria and anarthria: Secondary | ICD-10-CM | POA: Diagnosis not present

## 2015-09-01 DIAGNOSIS — N3281 Overactive bladder: Secondary | ICD-10-CM | POA: Diagnosis not present

## 2015-09-01 DIAGNOSIS — H3530 Unspecified macular degeneration: Secondary | ICD-10-CM | POA: Diagnosis not present

## 2015-09-01 DIAGNOSIS — C4490 Unspecified malignant neoplasm of skin, unspecified: Secondary | ICD-10-CM | POA: Diagnosis not present

## 2015-09-01 DIAGNOSIS — R1310 Dysphagia, unspecified: Secondary | ICD-10-CM | POA: Diagnosis not present

## 2015-09-01 DIAGNOSIS — J309 Allergic rhinitis, unspecified: Secondary | ICD-10-CM | POA: Diagnosis not present

## 2015-09-01 DIAGNOSIS — N952 Postmenopausal atrophic vaginitis: Secondary | ICD-10-CM | POA: Diagnosis not present

## 2015-09-01 DIAGNOSIS — E785 Hyperlipidemia, unspecified: Secondary | ICD-10-CM | POA: Diagnosis not present

## 2015-09-01 DIAGNOSIS — M2450 Contracture, unspecified joint: Secondary | ICD-10-CM | POA: Diagnosis not present

## 2015-09-02 DIAGNOSIS — R471 Dysarthria and anarthria: Secondary | ICD-10-CM | POA: Diagnosis not present

## 2015-09-02 DIAGNOSIS — N952 Postmenopausal atrophic vaginitis: Secondary | ICD-10-CM | POA: Diagnosis not present

## 2015-09-02 DIAGNOSIS — E46 Unspecified protein-calorie malnutrition: Secondary | ICD-10-CM | POA: Diagnosis not present

## 2015-09-02 DIAGNOSIS — H3530 Unspecified macular degeneration: Secondary | ICD-10-CM | POA: Diagnosis not present

## 2015-09-02 DIAGNOSIS — I10 Essential (primary) hypertension: Secondary | ICD-10-CM | POA: Diagnosis not present

## 2015-09-02 DIAGNOSIS — N3281 Overactive bladder: Secondary | ICD-10-CM | POA: Diagnosis not present

## 2015-09-02 DIAGNOSIS — G1221 Amyotrophic lateral sclerosis: Secondary | ICD-10-CM | POA: Diagnosis not present

## 2015-09-02 DIAGNOSIS — R1310 Dysphagia, unspecified: Secondary | ICD-10-CM | POA: Diagnosis not present

## 2015-09-02 DIAGNOSIS — E785 Hyperlipidemia, unspecified: Secondary | ICD-10-CM | POA: Diagnosis not present

## 2015-09-02 DIAGNOSIS — M2450 Contracture, unspecified joint: Secondary | ICD-10-CM | POA: Diagnosis not present

## 2015-09-02 DIAGNOSIS — J309 Allergic rhinitis, unspecified: Secondary | ICD-10-CM | POA: Diagnosis not present

## 2015-09-02 DIAGNOSIS — R0600 Dyspnea, unspecified: Secondary | ICD-10-CM | POA: Diagnosis not present

## 2015-09-02 DIAGNOSIS — C4490 Unspecified malignant neoplasm of skin, unspecified: Secondary | ICD-10-CM | POA: Diagnosis not present

## 2015-09-03 DIAGNOSIS — N3281 Overactive bladder: Secondary | ICD-10-CM | POA: Diagnosis not present

## 2015-09-03 DIAGNOSIS — G1221 Amyotrophic lateral sclerosis: Secondary | ICD-10-CM | POA: Diagnosis not present

## 2015-09-03 DIAGNOSIS — E46 Unspecified protein-calorie malnutrition: Secondary | ICD-10-CM | POA: Diagnosis not present

## 2015-09-03 DIAGNOSIS — I10 Essential (primary) hypertension: Secondary | ICD-10-CM | POA: Diagnosis not present

## 2015-09-03 DIAGNOSIS — M2450 Contracture, unspecified joint: Secondary | ICD-10-CM | POA: Diagnosis not present

## 2015-09-03 DIAGNOSIS — E785 Hyperlipidemia, unspecified: Secondary | ICD-10-CM | POA: Diagnosis not present

## 2015-09-03 DIAGNOSIS — R0600 Dyspnea, unspecified: Secondary | ICD-10-CM | POA: Diagnosis not present

## 2015-09-03 DIAGNOSIS — R471 Dysarthria and anarthria: Secondary | ICD-10-CM | POA: Diagnosis not present

## 2015-09-03 DIAGNOSIS — C4490 Unspecified malignant neoplasm of skin, unspecified: Secondary | ICD-10-CM | POA: Diagnosis not present

## 2015-09-03 DIAGNOSIS — R1310 Dysphagia, unspecified: Secondary | ICD-10-CM | POA: Diagnosis not present

## 2015-09-03 DIAGNOSIS — J309 Allergic rhinitis, unspecified: Secondary | ICD-10-CM | POA: Diagnosis not present

## 2015-09-03 DIAGNOSIS — H3530 Unspecified macular degeneration: Secondary | ICD-10-CM | POA: Diagnosis not present

## 2015-09-03 DIAGNOSIS — N952 Postmenopausal atrophic vaginitis: Secondary | ICD-10-CM | POA: Diagnosis not present

## 2015-09-04 DIAGNOSIS — H3530 Unspecified macular degeneration: Secondary | ICD-10-CM | POA: Diagnosis not present

## 2015-09-04 DIAGNOSIS — I10 Essential (primary) hypertension: Secondary | ICD-10-CM | POA: Diagnosis not present

## 2015-09-04 DIAGNOSIS — N3281 Overactive bladder: Secondary | ICD-10-CM | POA: Diagnosis not present

## 2015-09-04 DIAGNOSIS — C4490 Unspecified malignant neoplasm of skin, unspecified: Secondary | ICD-10-CM | POA: Diagnosis not present

## 2015-09-04 DIAGNOSIS — R1310 Dysphagia, unspecified: Secondary | ICD-10-CM | POA: Diagnosis not present

## 2015-09-04 DIAGNOSIS — R471 Dysarthria and anarthria: Secondary | ICD-10-CM | POA: Diagnosis not present

## 2015-09-04 DIAGNOSIS — J309 Allergic rhinitis, unspecified: Secondary | ICD-10-CM | POA: Diagnosis not present

## 2015-09-04 DIAGNOSIS — R0600 Dyspnea, unspecified: Secondary | ICD-10-CM | POA: Diagnosis not present

## 2015-09-04 DIAGNOSIS — E785 Hyperlipidemia, unspecified: Secondary | ICD-10-CM | POA: Diagnosis not present

## 2015-09-04 DIAGNOSIS — E46 Unspecified protein-calorie malnutrition: Secondary | ICD-10-CM | POA: Diagnosis not present

## 2015-09-04 DIAGNOSIS — M2450 Contracture, unspecified joint: Secondary | ICD-10-CM | POA: Diagnosis not present

## 2015-09-04 DIAGNOSIS — G1221 Amyotrophic lateral sclerosis: Secondary | ICD-10-CM | POA: Diagnosis not present

## 2015-09-04 DIAGNOSIS — N952 Postmenopausal atrophic vaginitis: Secondary | ICD-10-CM | POA: Diagnosis not present

## 2015-09-05 DIAGNOSIS — R1310 Dysphagia, unspecified: Secondary | ICD-10-CM | POA: Diagnosis not present

## 2015-09-05 DIAGNOSIS — N3281 Overactive bladder: Secondary | ICD-10-CM | POA: Diagnosis not present

## 2015-09-05 DIAGNOSIS — C4490 Unspecified malignant neoplasm of skin, unspecified: Secondary | ICD-10-CM | POA: Diagnosis not present

## 2015-09-05 DIAGNOSIS — R471 Dysarthria and anarthria: Secondary | ICD-10-CM | POA: Diagnosis not present

## 2015-09-05 DIAGNOSIS — E785 Hyperlipidemia, unspecified: Secondary | ICD-10-CM | POA: Diagnosis not present

## 2015-09-05 DIAGNOSIS — H3530 Unspecified macular degeneration: Secondary | ICD-10-CM | POA: Diagnosis not present

## 2015-09-05 DIAGNOSIS — E46 Unspecified protein-calorie malnutrition: Secondary | ICD-10-CM | POA: Diagnosis not present

## 2015-09-05 DIAGNOSIS — G1221 Amyotrophic lateral sclerosis: Secondary | ICD-10-CM | POA: Diagnosis not present

## 2015-09-05 DIAGNOSIS — R0600 Dyspnea, unspecified: Secondary | ICD-10-CM | POA: Diagnosis not present

## 2015-09-05 DIAGNOSIS — N952 Postmenopausal atrophic vaginitis: Secondary | ICD-10-CM | POA: Diagnosis not present

## 2015-09-05 DIAGNOSIS — M2450 Contracture, unspecified joint: Secondary | ICD-10-CM | POA: Diagnosis not present

## 2015-09-05 DIAGNOSIS — J309 Allergic rhinitis, unspecified: Secondary | ICD-10-CM | POA: Diagnosis not present

## 2015-09-05 DIAGNOSIS — I10 Essential (primary) hypertension: Secondary | ICD-10-CM | POA: Diagnosis not present

## 2015-09-06 DIAGNOSIS — E46 Unspecified protein-calorie malnutrition: Secondary | ICD-10-CM | POA: Diagnosis not present

## 2015-09-06 DIAGNOSIS — R471 Dysarthria and anarthria: Secondary | ICD-10-CM | POA: Diagnosis not present

## 2015-09-06 DIAGNOSIS — R1310 Dysphagia, unspecified: Secondary | ICD-10-CM | POA: Diagnosis not present

## 2015-09-06 DIAGNOSIS — E785 Hyperlipidemia, unspecified: Secondary | ICD-10-CM | POA: Diagnosis not present

## 2015-09-06 DIAGNOSIS — R0600 Dyspnea, unspecified: Secondary | ICD-10-CM | POA: Diagnosis not present

## 2015-09-06 DIAGNOSIS — J309 Allergic rhinitis, unspecified: Secondary | ICD-10-CM | POA: Diagnosis not present

## 2015-09-06 DIAGNOSIS — G1221 Amyotrophic lateral sclerosis: Secondary | ICD-10-CM | POA: Diagnosis not present

## 2015-09-06 DIAGNOSIS — N3281 Overactive bladder: Secondary | ICD-10-CM | POA: Diagnosis not present

## 2015-09-06 DIAGNOSIS — H3530 Unspecified macular degeneration: Secondary | ICD-10-CM | POA: Diagnosis not present

## 2015-09-06 DIAGNOSIS — I10 Essential (primary) hypertension: Secondary | ICD-10-CM | POA: Diagnosis not present

## 2015-09-06 DIAGNOSIS — N952 Postmenopausal atrophic vaginitis: Secondary | ICD-10-CM | POA: Diagnosis not present

## 2015-09-06 DIAGNOSIS — C4490 Unspecified malignant neoplasm of skin, unspecified: Secondary | ICD-10-CM | POA: Diagnosis not present

## 2015-09-06 DIAGNOSIS — M2450 Contracture, unspecified joint: Secondary | ICD-10-CM | POA: Diagnosis not present

## 2015-09-07 DIAGNOSIS — J309 Allergic rhinitis, unspecified: Secondary | ICD-10-CM | POA: Diagnosis not present

## 2015-09-07 DIAGNOSIS — N3281 Overactive bladder: Secondary | ICD-10-CM | POA: Diagnosis not present

## 2015-09-07 DIAGNOSIS — G1221 Amyotrophic lateral sclerosis: Secondary | ICD-10-CM | POA: Diagnosis not present

## 2015-09-07 DIAGNOSIS — M2450 Contracture, unspecified joint: Secondary | ICD-10-CM | POA: Diagnosis not present

## 2015-09-07 DIAGNOSIS — R471 Dysarthria and anarthria: Secondary | ICD-10-CM | POA: Diagnosis not present

## 2015-09-07 DIAGNOSIS — C4490 Unspecified malignant neoplasm of skin, unspecified: Secondary | ICD-10-CM | POA: Diagnosis not present

## 2015-09-07 DIAGNOSIS — E785 Hyperlipidemia, unspecified: Secondary | ICD-10-CM | POA: Diagnosis not present

## 2015-09-07 DIAGNOSIS — N952 Postmenopausal atrophic vaginitis: Secondary | ICD-10-CM | POA: Diagnosis not present

## 2015-09-07 DIAGNOSIS — E46 Unspecified protein-calorie malnutrition: Secondary | ICD-10-CM | POA: Diagnosis not present

## 2015-09-07 DIAGNOSIS — R0600 Dyspnea, unspecified: Secondary | ICD-10-CM | POA: Diagnosis not present

## 2015-09-07 DIAGNOSIS — R1310 Dysphagia, unspecified: Secondary | ICD-10-CM | POA: Diagnosis not present

## 2015-09-07 DIAGNOSIS — H3530 Unspecified macular degeneration: Secondary | ICD-10-CM | POA: Diagnosis not present

## 2015-09-07 DIAGNOSIS — I10 Essential (primary) hypertension: Secondary | ICD-10-CM | POA: Diagnosis not present

## 2015-09-08 DIAGNOSIS — R471 Dysarthria and anarthria: Secondary | ICD-10-CM | POA: Diagnosis not present

## 2015-09-08 DIAGNOSIS — C4490 Unspecified malignant neoplasm of skin, unspecified: Secondary | ICD-10-CM | POA: Diagnosis not present

## 2015-09-08 DIAGNOSIS — R1310 Dysphagia, unspecified: Secondary | ICD-10-CM | POA: Diagnosis not present

## 2015-09-08 DIAGNOSIS — N952 Postmenopausal atrophic vaginitis: Secondary | ICD-10-CM | POA: Diagnosis not present

## 2015-09-08 DIAGNOSIS — H3530 Unspecified macular degeneration: Secondary | ICD-10-CM | POA: Diagnosis not present

## 2015-09-08 DIAGNOSIS — E785 Hyperlipidemia, unspecified: Secondary | ICD-10-CM | POA: Diagnosis not present

## 2015-09-08 DIAGNOSIS — E46 Unspecified protein-calorie malnutrition: Secondary | ICD-10-CM | POA: Diagnosis not present

## 2015-09-08 DIAGNOSIS — J309 Allergic rhinitis, unspecified: Secondary | ICD-10-CM | POA: Diagnosis not present

## 2015-09-08 DIAGNOSIS — N3281 Overactive bladder: Secondary | ICD-10-CM | POA: Diagnosis not present

## 2015-09-08 DIAGNOSIS — R0600 Dyspnea, unspecified: Secondary | ICD-10-CM | POA: Diagnosis not present

## 2015-09-08 DIAGNOSIS — I10 Essential (primary) hypertension: Secondary | ICD-10-CM | POA: Diagnosis not present

## 2015-09-08 DIAGNOSIS — M2450 Contracture, unspecified joint: Secondary | ICD-10-CM | POA: Diagnosis not present

## 2015-09-08 DIAGNOSIS — G1221 Amyotrophic lateral sclerosis: Secondary | ICD-10-CM | POA: Diagnosis not present

## 2015-09-09 DIAGNOSIS — I10 Essential (primary) hypertension: Secondary | ICD-10-CM | POA: Diagnosis not present

## 2015-09-09 DIAGNOSIS — R471 Dysarthria and anarthria: Secondary | ICD-10-CM | POA: Diagnosis not present

## 2015-09-09 DIAGNOSIS — R0600 Dyspnea, unspecified: Secondary | ICD-10-CM | POA: Diagnosis not present

## 2015-09-09 DIAGNOSIS — N3281 Overactive bladder: Secondary | ICD-10-CM | POA: Diagnosis not present

## 2015-09-09 DIAGNOSIS — E46 Unspecified protein-calorie malnutrition: Secondary | ICD-10-CM | POA: Diagnosis not present

## 2015-09-09 DIAGNOSIS — C4490 Unspecified malignant neoplasm of skin, unspecified: Secondary | ICD-10-CM | POA: Diagnosis not present

## 2015-09-09 DIAGNOSIS — R1310 Dysphagia, unspecified: Secondary | ICD-10-CM | POA: Diagnosis not present

## 2015-09-09 DIAGNOSIS — H3530 Unspecified macular degeneration: Secondary | ICD-10-CM | POA: Diagnosis not present

## 2015-09-09 DIAGNOSIS — E785 Hyperlipidemia, unspecified: Secondary | ICD-10-CM | POA: Diagnosis not present

## 2015-09-09 DIAGNOSIS — G1221 Amyotrophic lateral sclerosis: Secondary | ICD-10-CM | POA: Diagnosis not present

## 2015-09-09 DIAGNOSIS — N952 Postmenopausal atrophic vaginitis: Secondary | ICD-10-CM | POA: Diagnosis not present

## 2015-09-09 DIAGNOSIS — J309 Allergic rhinitis, unspecified: Secondary | ICD-10-CM | POA: Diagnosis not present

## 2015-09-09 DIAGNOSIS — M2450 Contracture, unspecified joint: Secondary | ICD-10-CM | POA: Diagnosis not present

## 2015-09-10 DIAGNOSIS — R471 Dysarthria and anarthria: Secondary | ICD-10-CM | POA: Diagnosis not present

## 2015-09-10 DIAGNOSIS — H3530 Unspecified macular degeneration: Secondary | ICD-10-CM | POA: Diagnosis not present

## 2015-09-10 DIAGNOSIS — E785 Hyperlipidemia, unspecified: Secondary | ICD-10-CM | POA: Diagnosis not present

## 2015-09-10 DIAGNOSIS — E46 Unspecified protein-calorie malnutrition: Secondary | ICD-10-CM | POA: Diagnosis not present

## 2015-09-10 DIAGNOSIS — J309 Allergic rhinitis, unspecified: Secondary | ICD-10-CM | POA: Diagnosis not present

## 2015-09-10 DIAGNOSIS — R0600 Dyspnea, unspecified: Secondary | ICD-10-CM | POA: Diagnosis not present

## 2015-09-10 DIAGNOSIS — N3281 Overactive bladder: Secondary | ICD-10-CM | POA: Diagnosis not present

## 2015-09-10 DIAGNOSIS — I10 Essential (primary) hypertension: Secondary | ICD-10-CM | POA: Diagnosis not present

## 2015-09-10 DIAGNOSIS — G1221 Amyotrophic lateral sclerosis: Secondary | ICD-10-CM | POA: Diagnosis not present

## 2015-09-10 DIAGNOSIS — C4490 Unspecified malignant neoplasm of skin, unspecified: Secondary | ICD-10-CM | POA: Diagnosis not present

## 2015-09-10 DIAGNOSIS — M2450 Contracture, unspecified joint: Secondary | ICD-10-CM | POA: Diagnosis not present

## 2015-09-10 DIAGNOSIS — R1310 Dysphagia, unspecified: Secondary | ICD-10-CM | POA: Diagnosis not present

## 2015-09-10 DIAGNOSIS — N952 Postmenopausal atrophic vaginitis: Secondary | ICD-10-CM | POA: Diagnosis not present

## 2015-09-11 DIAGNOSIS — E785 Hyperlipidemia, unspecified: Secondary | ICD-10-CM | POA: Diagnosis not present

## 2015-09-11 DIAGNOSIS — R1310 Dysphagia, unspecified: Secondary | ICD-10-CM | POA: Diagnosis not present

## 2015-09-11 DIAGNOSIS — N3281 Overactive bladder: Secondary | ICD-10-CM | POA: Diagnosis not present

## 2015-09-11 DIAGNOSIS — R0600 Dyspnea, unspecified: Secondary | ICD-10-CM | POA: Diagnosis not present

## 2015-09-11 DIAGNOSIS — R471 Dysarthria and anarthria: Secondary | ICD-10-CM | POA: Diagnosis not present

## 2015-09-11 DIAGNOSIS — G1221 Amyotrophic lateral sclerosis: Secondary | ICD-10-CM | POA: Diagnosis not present

## 2015-09-11 DIAGNOSIS — M2450 Contracture, unspecified joint: Secondary | ICD-10-CM | POA: Diagnosis not present

## 2015-09-11 DIAGNOSIS — I10 Essential (primary) hypertension: Secondary | ICD-10-CM | POA: Diagnosis not present

## 2015-09-11 DIAGNOSIS — C4490 Unspecified malignant neoplasm of skin, unspecified: Secondary | ICD-10-CM | POA: Diagnosis not present

## 2015-09-11 DIAGNOSIS — J309 Allergic rhinitis, unspecified: Secondary | ICD-10-CM | POA: Diagnosis not present

## 2015-09-11 DIAGNOSIS — N952 Postmenopausal atrophic vaginitis: Secondary | ICD-10-CM | POA: Diagnosis not present

## 2015-09-11 DIAGNOSIS — H3530 Unspecified macular degeneration: Secondary | ICD-10-CM | POA: Diagnosis not present

## 2015-09-11 DIAGNOSIS — E46 Unspecified protein-calorie malnutrition: Secondary | ICD-10-CM | POA: Diagnosis not present

## 2015-09-12 DIAGNOSIS — R471 Dysarthria and anarthria: Secondary | ICD-10-CM | POA: Diagnosis not present

## 2015-09-12 DIAGNOSIS — I10 Essential (primary) hypertension: Secondary | ICD-10-CM | POA: Diagnosis not present

## 2015-09-12 DIAGNOSIS — N3281 Overactive bladder: Secondary | ICD-10-CM | POA: Diagnosis not present

## 2015-09-12 DIAGNOSIS — H3530 Unspecified macular degeneration: Secondary | ICD-10-CM | POA: Diagnosis not present

## 2015-09-12 DIAGNOSIS — G1221 Amyotrophic lateral sclerosis: Secondary | ICD-10-CM | POA: Diagnosis not present

## 2015-09-12 DIAGNOSIS — E785 Hyperlipidemia, unspecified: Secondary | ICD-10-CM | POA: Diagnosis not present

## 2015-09-12 DIAGNOSIS — J309 Allergic rhinitis, unspecified: Secondary | ICD-10-CM | POA: Diagnosis not present

## 2015-09-12 DIAGNOSIS — C4490 Unspecified malignant neoplasm of skin, unspecified: Secondary | ICD-10-CM | POA: Diagnosis not present

## 2015-09-12 DIAGNOSIS — E46 Unspecified protein-calorie malnutrition: Secondary | ICD-10-CM | POA: Diagnosis not present

## 2015-09-12 DIAGNOSIS — M2450 Contracture, unspecified joint: Secondary | ICD-10-CM | POA: Diagnosis not present

## 2015-09-12 DIAGNOSIS — R1310 Dysphagia, unspecified: Secondary | ICD-10-CM | POA: Diagnosis not present

## 2015-09-12 DIAGNOSIS — R0600 Dyspnea, unspecified: Secondary | ICD-10-CM | POA: Diagnosis not present

## 2015-09-12 DIAGNOSIS — N952 Postmenopausal atrophic vaginitis: Secondary | ICD-10-CM | POA: Diagnosis not present

## 2015-09-13 DIAGNOSIS — M2450 Contracture, unspecified joint: Secondary | ICD-10-CM | POA: Diagnosis not present

## 2015-09-13 DIAGNOSIS — I10 Essential (primary) hypertension: Secondary | ICD-10-CM | POA: Diagnosis not present

## 2015-09-13 DIAGNOSIS — R0600 Dyspnea, unspecified: Secondary | ICD-10-CM | POA: Diagnosis not present

## 2015-09-13 DIAGNOSIS — E785 Hyperlipidemia, unspecified: Secondary | ICD-10-CM | POA: Diagnosis not present

## 2015-09-13 DIAGNOSIS — G1221 Amyotrophic lateral sclerosis: Secondary | ICD-10-CM | POA: Diagnosis not present

## 2015-09-13 DIAGNOSIS — E46 Unspecified protein-calorie malnutrition: Secondary | ICD-10-CM | POA: Diagnosis not present

## 2015-09-13 DIAGNOSIS — C4490 Unspecified malignant neoplasm of skin, unspecified: Secondary | ICD-10-CM | POA: Diagnosis not present

## 2015-09-13 DIAGNOSIS — R1310 Dysphagia, unspecified: Secondary | ICD-10-CM | POA: Diagnosis not present

## 2015-09-13 DIAGNOSIS — N3281 Overactive bladder: Secondary | ICD-10-CM | POA: Diagnosis not present

## 2015-09-13 DIAGNOSIS — J309 Allergic rhinitis, unspecified: Secondary | ICD-10-CM | POA: Diagnosis not present

## 2015-09-13 DIAGNOSIS — H3530 Unspecified macular degeneration: Secondary | ICD-10-CM | POA: Diagnosis not present

## 2015-09-13 DIAGNOSIS — R471 Dysarthria and anarthria: Secondary | ICD-10-CM | POA: Diagnosis not present

## 2015-09-13 DIAGNOSIS — N952 Postmenopausal atrophic vaginitis: Secondary | ICD-10-CM | POA: Diagnosis not present

## 2015-09-14 DIAGNOSIS — R471 Dysarthria and anarthria: Secondary | ICD-10-CM | POA: Diagnosis not present

## 2015-09-14 DIAGNOSIS — E785 Hyperlipidemia, unspecified: Secondary | ICD-10-CM | POA: Diagnosis not present

## 2015-09-14 DIAGNOSIS — R1310 Dysphagia, unspecified: Secondary | ICD-10-CM | POA: Diagnosis not present

## 2015-09-14 DIAGNOSIS — N3281 Overactive bladder: Secondary | ICD-10-CM | POA: Diagnosis not present

## 2015-09-14 DIAGNOSIS — H3530 Unspecified macular degeneration: Secondary | ICD-10-CM | POA: Diagnosis not present

## 2015-09-14 DIAGNOSIS — J309 Allergic rhinitis, unspecified: Secondary | ICD-10-CM | POA: Diagnosis not present

## 2015-09-14 DIAGNOSIS — C4490 Unspecified malignant neoplasm of skin, unspecified: Secondary | ICD-10-CM | POA: Diagnosis not present

## 2015-09-14 DIAGNOSIS — N952 Postmenopausal atrophic vaginitis: Secondary | ICD-10-CM | POA: Diagnosis not present

## 2015-09-14 DIAGNOSIS — M2450 Contracture, unspecified joint: Secondary | ICD-10-CM | POA: Diagnosis not present

## 2015-09-14 DIAGNOSIS — G1221 Amyotrophic lateral sclerosis: Secondary | ICD-10-CM | POA: Diagnosis not present

## 2015-09-14 DIAGNOSIS — R0600 Dyspnea, unspecified: Secondary | ICD-10-CM | POA: Diagnosis not present

## 2015-09-14 DIAGNOSIS — I10 Essential (primary) hypertension: Secondary | ICD-10-CM | POA: Diagnosis not present

## 2015-09-14 DIAGNOSIS — E46 Unspecified protein-calorie malnutrition: Secondary | ICD-10-CM | POA: Diagnosis not present

## 2015-09-15 DIAGNOSIS — E785 Hyperlipidemia, unspecified: Secondary | ICD-10-CM | POA: Diagnosis not present

## 2015-09-15 DIAGNOSIS — R0600 Dyspnea, unspecified: Secondary | ICD-10-CM | POA: Diagnosis not present

## 2015-09-15 DIAGNOSIS — M2450 Contracture, unspecified joint: Secondary | ICD-10-CM | POA: Diagnosis not present

## 2015-09-15 DIAGNOSIS — N952 Postmenopausal atrophic vaginitis: Secondary | ICD-10-CM | POA: Diagnosis not present

## 2015-09-15 DIAGNOSIS — R1310 Dysphagia, unspecified: Secondary | ICD-10-CM | POA: Diagnosis not present

## 2015-09-15 DIAGNOSIS — R471 Dysarthria and anarthria: Secondary | ICD-10-CM | POA: Diagnosis not present

## 2015-09-15 DIAGNOSIS — H3530 Unspecified macular degeneration: Secondary | ICD-10-CM | POA: Diagnosis not present

## 2015-09-15 DIAGNOSIS — G1221 Amyotrophic lateral sclerosis: Secondary | ICD-10-CM | POA: Diagnosis not present

## 2015-09-15 DIAGNOSIS — E46 Unspecified protein-calorie malnutrition: Secondary | ICD-10-CM | POA: Diagnosis not present

## 2015-09-15 DIAGNOSIS — C4490 Unspecified malignant neoplasm of skin, unspecified: Secondary | ICD-10-CM | POA: Diagnosis not present

## 2015-09-15 DIAGNOSIS — I10 Essential (primary) hypertension: Secondary | ICD-10-CM | POA: Diagnosis not present

## 2015-09-15 DIAGNOSIS — J309 Allergic rhinitis, unspecified: Secondary | ICD-10-CM | POA: Diagnosis not present

## 2015-09-15 DIAGNOSIS — N3281 Overactive bladder: Secondary | ICD-10-CM | POA: Diagnosis not present

## 2015-09-16 DIAGNOSIS — J309 Allergic rhinitis, unspecified: Secondary | ICD-10-CM | POA: Diagnosis not present

## 2015-09-16 DIAGNOSIS — R471 Dysarthria and anarthria: Secondary | ICD-10-CM | POA: Diagnosis not present

## 2015-09-16 DIAGNOSIS — C4490 Unspecified malignant neoplasm of skin, unspecified: Secondary | ICD-10-CM | POA: Diagnosis not present

## 2015-09-16 DIAGNOSIS — G1221 Amyotrophic lateral sclerosis: Secondary | ICD-10-CM | POA: Diagnosis not present

## 2015-09-16 DIAGNOSIS — N952 Postmenopausal atrophic vaginitis: Secondary | ICD-10-CM | POA: Diagnosis not present

## 2015-09-16 DIAGNOSIS — E785 Hyperlipidemia, unspecified: Secondary | ICD-10-CM | POA: Diagnosis not present

## 2015-09-16 DIAGNOSIS — M2450 Contracture, unspecified joint: Secondary | ICD-10-CM | POA: Diagnosis not present

## 2015-09-16 DIAGNOSIS — H3530 Unspecified macular degeneration: Secondary | ICD-10-CM | POA: Diagnosis not present

## 2015-09-16 DIAGNOSIS — N3281 Overactive bladder: Secondary | ICD-10-CM | POA: Diagnosis not present

## 2015-09-16 DIAGNOSIS — R1310 Dysphagia, unspecified: Secondary | ICD-10-CM | POA: Diagnosis not present

## 2015-09-16 DIAGNOSIS — I10 Essential (primary) hypertension: Secondary | ICD-10-CM | POA: Diagnosis not present

## 2015-09-16 DIAGNOSIS — R0600 Dyspnea, unspecified: Secondary | ICD-10-CM | POA: Diagnosis not present

## 2015-09-16 DIAGNOSIS — E46 Unspecified protein-calorie malnutrition: Secondary | ICD-10-CM | POA: Diagnosis not present

## 2015-09-17 DIAGNOSIS — E46 Unspecified protein-calorie malnutrition: Secondary | ICD-10-CM | POA: Diagnosis not present

## 2015-09-17 DIAGNOSIS — J309 Allergic rhinitis, unspecified: Secondary | ICD-10-CM | POA: Diagnosis not present

## 2015-09-17 DIAGNOSIS — G1221 Amyotrophic lateral sclerosis: Secondary | ICD-10-CM | POA: Diagnosis not present

## 2015-09-17 DIAGNOSIS — N952 Postmenopausal atrophic vaginitis: Secondary | ICD-10-CM | POA: Diagnosis not present

## 2015-09-17 DIAGNOSIS — I10 Essential (primary) hypertension: Secondary | ICD-10-CM | POA: Diagnosis not present

## 2015-09-17 DIAGNOSIS — R471 Dysarthria and anarthria: Secondary | ICD-10-CM | POA: Diagnosis not present

## 2015-09-17 DIAGNOSIS — R0600 Dyspnea, unspecified: Secondary | ICD-10-CM | POA: Diagnosis not present

## 2015-09-17 DIAGNOSIS — M2450 Contracture, unspecified joint: Secondary | ICD-10-CM | POA: Diagnosis not present

## 2015-09-17 DIAGNOSIS — R1310 Dysphagia, unspecified: Secondary | ICD-10-CM | POA: Diagnosis not present

## 2015-09-17 DIAGNOSIS — C4490 Unspecified malignant neoplasm of skin, unspecified: Secondary | ICD-10-CM | POA: Diagnosis not present

## 2015-09-17 DIAGNOSIS — N3281 Overactive bladder: Secondary | ICD-10-CM | POA: Diagnosis not present

## 2015-09-17 DIAGNOSIS — E785 Hyperlipidemia, unspecified: Secondary | ICD-10-CM | POA: Diagnosis not present

## 2015-09-17 DIAGNOSIS — H3530 Unspecified macular degeneration: Secondary | ICD-10-CM | POA: Diagnosis not present

## 2015-09-18 DIAGNOSIS — E785 Hyperlipidemia, unspecified: Secondary | ICD-10-CM | POA: Diagnosis not present

## 2015-09-18 DIAGNOSIS — R0600 Dyspnea, unspecified: Secondary | ICD-10-CM | POA: Diagnosis not present

## 2015-09-18 DIAGNOSIS — N952 Postmenopausal atrophic vaginitis: Secondary | ICD-10-CM | POA: Diagnosis not present

## 2015-09-18 DIAGNOSIS — R471 Dysarthria and anarthria: Secondary | ICD-10-CM | POA: Diagnosis not present

## 2015-09-18 DIAGNOSIS — I10 Essential (primary) hypertension: Secondary | ICD-10-CM | POA: Diagnosis not present

## 2015-09-18 DIAGNOSIS — M2450 Contracture, unspecified joint: Secondary | ICD-10-CM | POA: Diagnosis not present

## 2015-09-18 DIAGNOSIS — N3281 Overactive bladder: Secondary | ICD-10-CM | POA: Diagnosis not present

## 2015-09-18 DIAGNOSIS — G1221 Amyotrophic lateral sclerosis: Secondary | ICD-10-CM | POA: Diagnosis not present

## 2015-09-18 DIAGNOSIS — C4490 Unspecified malignant neoplasm of skin, unspecified: Secondary | ICD-10-CM | POA: Diagnosis not present

## 2015-09-18 DIAGNOSIS — R1310 Dysphagia, unspecified: Secondary | ICD-10-CM | POA: Diagnosis not present

## 2015-09-18 DIAGNOSIS — H3530 Unspecified macular degeneration: Secondary | ICD-10-CM | POA: Diagnosis not present

## 2015-09-18 DIAGNOSIS — J309 Allergic rhinitis, unspecified: Secondary | ICD-10-CM | POA: Diagnosis not present

## 2015-09-18 DIAGNOSIS — E46 Unspecified protein-calorie malnutrition: Secondary | ICD-10-CM | POA: Diagnosis not present

## 2015-09-19 DIAGNOSIS — E46 Unspecified protein-calorie malnutrition: Secondary | ICD-10-CM | POA: Diagnosis not present

## 2015-09-19 DIAGNOSIS — C4490 Unspecified malignant neoplasm of skin, unspecified: Secondary | ICD-10-CM | POA: Diagnosis not present

## 2015-09-19 DIAGNOSIS — R0600 Dyspnea, unspecified: Secondary | ICD-10-CM | POA: Diagnosis not present

## 2015-09-19 DIAGNOSIS — E785 Hyperlipidemia, unspecified: Secondary | ICD-10-CM | POA: Diagnosis not present

## 2015-09-19 DIAGNOSIS — M2450 Contracture, unspecified joint: Secondary | ICD-10-CM | POA: Diagnosis not present

## 2015-09-19 DIAGNOSIS — R1310 Dysphagia, unspecified: Secondary | ICD-10-CM | POA: Diagnosis not present

## 2015-09-19 DIAGNOSIS — J309 Allergic rhinitis, unspecified: Secondary | ICD-10-CM | POA: Diagnosis not present

## 2015-09-19 DIAGNOSIS — I10 Essential (primary) hypertension: Secondary | ICD-10-CM | POA: Diagnosis not present

## 2015-09-19 DIAGNOSIS — N3281 Overactive bladder: Secondary | ICD-10-CM | POA: Diagnosis not present

## 2015-09-19 DIAGNOSIS — R471 Dysarthria and anarthria: Secondary | ICD-10-CM | POA: Diagnosis not present

## 2015-09-19 DIAGNOSIS — H3530 Unspecified macular degeneration: Secondary | ICD-10-CM | POA: Diagnosis not present

## 2015-09-19 DIAGNOSIS — N952 Postmenopausal atrophic vaginitis: Secondary | ICD-10-CM | POA: Diagnosis not present

## 2015-09-19 DIAGNOSIS — G1221 Amyotrophic lateral sclerosis: Secondary | ICD-10-CM | POA: Diagnosis not present

## 2015-09-20 DIAGNOSIS — I10 Essential (primary) hypertension: Secondary | ICD-10-CM | POA: Diagnosis not present

## 2015-09-20 DIAGNOSIS — R0600 Dyspnea, unspecified: Secondary | ICD-10-CM | POA: Diagnosis not present

## 2015-09-20 DIAGNOSIS — E46 Unspecified protein-calorie malnutrition: Secondary | ICD-10-CM | POA: Diagnosis not present

## 2015-09-20 DIAGNOSIS — N952 Postmenopausal atrophic vaginitis: Secondary | ICD-10-CM | POA: Diagnosis not present

## 2015-09-20 DIAGNOSIS — C4490 Unspecified malignant neoplasm of skin, unspecified: Secondary | ICD-10-CM | POA: Diagnosis not present

## 2015-09-20 DIAGNOSIS — Z79899 Other long term (current) drug therapy: Secondary | ICD-10-CM | POA: Diagnosis not present

## 2015-09-20 DIAGNOSIS — H3530 Unspecified macular degeneration: Secondary | ICD-10-CM | POA: Diagnosis not present

## 2015-09-20 DIAGNOSIS — R1310 Dysphagia, unspecified: Secondary | ICD-10-CM | POA: Diagnosis not present

## 2015-09-20 DIAGNOSIS — G1221 Amyotrophic lateral sclerosis: Secondary | ICD-10-CM | POA: Diagnosis not present

## 2015-09-20 DIAGNOSIS — M2450 Contracture, unspecified joint: Secondary | ICD-10-CM | POA: Diagnosis not present

## 2015-09-20 DIAGNOSIS — J309 Allergic rhinitis, unspecified: Secondary | ICD-10-CM | POA: Diagnosis not present

## 2015-09-20 DIAGNOSIS — E785 Hyperlipidemia, unspecified: Secondary | ICD-10-CM | POA: Diagnosis not present

## 2015-09-20 DIAGNOSIS — N3281 Overactive bladder: Secondary | ICD-10-CM | POA: Diagnosis not present

## 2015-09-20 DIAGNOSIS — R471 Dysarthria and anarthria: Secondary | ICD-10-CM | POA: Diagnosis not present

## 2015-09-21 DIAGNOSIS — G1221 Amyotrophic lateral sclerosis: Secondary | ICD-10-CM | POA: Diagnosis not present

## 2015-09-21 DIAGNOSIS — R1310 Dysphagia, unspecified: Secondary | ICD-10-CM | POA: Diagnosis not present

## 2015-09-21 DIAGNOSIS — R0600 Dyspnea, unspecified: Secondary | ICD-10-CM | POA: Diagnosis not present

## 2015-09-21 DIAGNOSIS — I10 Essential (primary) hypertension: Secondary | ICD-10-CM | POA: Diagnosis not present

## 2015-09-21 DIAGNOSIS — M2450 Contracture, unspecified joint: Secondary | ICD-10-CM | POA: Diagnosis not present

## 2015-09-21 DIAGNOSIS — E46 Unspecified protein-calorie malnutrition: Secondary | ICD-10-CM | POA: Diagnosis not present

## 2015-09-21 DIAGNOSIS — E785 Hyperlipidemia, unspecified: Secondary | ICD-10-CM | POA: Diagnosis not present

## 2015-09-21 DIAGNOSIS — H3530 Unspecified macular degeneration: Secondary | ICD-10-CM | POA: Diagnosis not present

## 2015-09-21 DIAGNOSIS — R471 Dysarthria and anarthria: Secondary | ICD-10-CM | POA: Diagnosis not present

## 2015-09-21 DIAGNOSIS — N952 Postmenopausal atrophic vaginitis: Secondary | ICD-10-CM | POA: Diagnosis not present

## 2015-09-21 DIAGNOSIS — C4490 Unspecified malignant neoplasm of skin, unspecified: Secondary | ICD-10-CM | POA: Diagnosis not present

## 2015-09-21 DIAGNOSIS — N3281 Overactive bladder: Secondary | ICD-10-CM | POA: Diagnosis not present

## 2015-09-21 DIAGNOSIS — J309 Allergic rhinitis, unspecified: Secondary | ICD-10-CM | POA: Diagnosis not present

## 2015-09-22 DIAGNOSIS — M2450 Contracture, unspecified joint: Secondary | ICD-10-CM | POA: Diagnosis not present

## 2015-09-22 DIAGNOSIS — J309 Allergic rhinitis, unspecified: Secondary | ICD-10-CM | POA: Diagnosis not present

## 2015-09-22 DIAGNOSIS — R471 Dysarthria and anarthria: Secondary | ICD-10-CM | POA: Diagnosis not present

## 2015-09-22 DIAGNOSIS — E46 Unspecified protein-calorie malnutrition: Secondary | ICD-10-CM | POA: Diagnosis not present

## 2015-09-22 DIAGNOSIS — N3281 Overactive bladder: Secondary | ICD-10-CM | POA: Diagnosis not present

## 2015-09-22 DIAGNOSIS — G1221 Amyotrophic lateral sclerosis: Secondary | ICD-10-CM | POA: Diagnosis not present

## 2015-09-22 DIAGNOSIS — E785 Hyperlipidemia, unspecified: Secondary | ICD-10-CM | POA: Diagnosis not present

## 2015-09-22 DIAGNOSIS — I10 Essential (primary) hypertension: Secondary | ICD-10-CM | POA: Diagnosis not present

## 2015-09-22 DIAGNOSIS — H3530 Unspecified macular degeneration: Secondary | ICD-10-CM | POA: Diagnosis not present

## 2015-09-22 DIAGNOSIS — C4490 Unspecified malignant neoplasm of skin, unspecified: Secondary | ICD-10-CM | POA: Diagnosis not present

## 2015-09-22 DIAGNOSIS — R1310 Dysphagia, unspecified: Secondary | ICD-10-CM | POA: Diagnosis not present

## 2015-09-22 DIAGNOSIS — R0600 Dyspnea, unspecified: Secondary | ICD-10-CM | POA: Diagnosis not present

## 2015-09-22 DIAGNOSIS — N952 Postmenopausal atrophic vaginitis: Secondary | ICD-10-CM | POA: Diagnosis not present

## 2015-09-23 ENCOUNTER — Encounter: Payer: Self-pay | Admitting: Adult Health

## 2015-09-23 ENCOUNTER — Non-Acute Institutional Stay (SKILLED_NURSING_FACILITY): Payer: Medicare Other | Admitting: Adult Health

## 2015-09-23 DIAGNOSIS — N390 Urinary tract infection, site not specified: Secondary | ICD-10-CM

## 2015-09-23 DIAGNOSIS — E785 Hyperlipidemia, unspecified: Secondary | ICD-10-CM | POA: Diagnosis not present

## 2015-09-23 DIAGNOSIS — J309 Allergic rhinitis, unspecified: Secondary | ICD-10-CM | POA: Diagnosis not present

## 2015-09-23 DIAGNOSIS — R1310 Dysphagia, unspecified: Secondary | ICD-10-CM | POA: Diagnosis not present

## 2015-09-23 DIAGNOSIS — C4490 Unspecified malignant neoplasm of skin, unspecified: Secondary | ICD-10-CM | POA: Diagnosis not present

## 2015-09-23 DIAGNOSIS — R471 Dysarthria and anarthria: Secondary | ICD-10-CM | POA: Diagnosis not present

## 2015-09-23 DIAGNOSIS — R0600 Dyspnea, unspecified: Secondary | ICD-10-CM | POA: Diagnosis not present

## 2015-09-23 DIAGNOSIS — N3281 Overactive bladder: Secondary | ICD-10-CM | POA: Diagnosis not present

## 2015-09-23 DIAGNOSIS — G1221 Amyotrophic lateral sclerosis: Secondary | ICD-10-CM | POA: Diagnosis not present

## 2015-09-23 DIAGNOSIS — H3530 Unspecified macular degeneration: Secondary | ICD-10-CM | POA: Diagnosis not present

## 2015-09-23 DIAGNOSIS — N952 Postmenopausal atrophic vaginitis: Secondary | ICD-10-CM | POA: Diagnosis not present

## 2015-09-23 DIAGNOSIS — M2450 Contracture, unspecified joint: Secondary | ICD-10-CM | POA: Diagnosis not present

## 2015-09-23 DIAGNOSIS — E46 Unspecified protein-calorie malnutrition: Secondary | ICD-10-CM | POA: Diagnosis not present

## 2015-09-23 DIAGNOSIS — I10 Essential (primary) hypertension: Secondary | ICD-10-CM | POA: Diagnosis not present

## 2015-09-23 NOTE — Progress Notes (Signed)
Patient ID: Ashley Vaughan, female   DOB: Sep 28, 1928, 82 y.o.   MRN: [id number]    DATE:    09/23/15  Facility:  Nursing Home Location:  [place] Room Number: [id number] LEVEL OF CARE:  SNF (31)   Chief Complaint  Patient presents with  . Acute Visit    UTI    HISTORY OF PRESENT ILLNESS:  This is an 82 year old female who complained of dysuria. Urine culture showed >100,000 CFU/ml proteus mirabilis. No fever nor hematuria was noted.   PAST MEDICAL HISTORY:  Past Medical History  Diagnosis Date  . Hypertension   . Osteoporosis   . Migraine (atenolol for prophylaxis)  . Macular degeneration   . Kidney stones, calcium oxalate 1993  . Pulmonary hypertension (HCC) mild,mild AR,mild-mod MR,mild-mod TR  . Hematuria, microscopic DrKimbrough 1993  . Hiatal hernia   . Diverticulosis (L sided) 1994  . Hyperlipidemia   . BCC (basal cell carcinoma of skin) L cheek-DrLupton (6/09)  . Postmenopausal bleeding 2010-DrCole  . Allergy     s/p immunotherapy  . Mitral regurgitation     mild-mod; mild aortic regurgitation, and mild-mod TR  . Atrophic vaginitis   . Hearing loss     right ear  . B12 deficiency   . ALS (amyotrophic lateral sclerosis) ([place])   . Neuropathy ([place])   . Closed right tibial fracture   . Anxiety   . GERD (gastroesophageal reflux disease)     Esophagitis presence not specified  . Insomnia   . Constipation   . OAB (overactive bladder)   . Postmenopausal HRT (hormone replacement therapy)     CURRENT MEDICATIONS: Reviewed per MAR/see medication list   Medication List       This list is accurate as of: 09/23/15 11:59 PM.  Always use your most recent med list.               acetaminophen 325 MG tablet  Commonly known as:  TYLENOL  Take 650 mg by mouth every 4 (four) hours as needed for mild pain or fever (Fever >100).     amLODipine 5 MG tablet  Commonly known as:  NORVASC  Take 1 tablet (5 mg total) by mouth daily.     aspirin 81 MG chewable tablet  Chew 81 mg by mouth at bedtime.     aspirin 81 MG tablet  Take 81 mg by mouth at bedtime.     B-12 5000 MCG Subl  Place 1 tablet under the tongue daily.     ciprofloxacin 500 MG tablet  Commonly known as:  CIPRO  Take 500 mg by mouth 2 (two) times daily. For seven (7) days     estrogens (conjugated) 0.625 MG tablet  Commonly known as:  PREMARIN  Place 0.625 mg vaginally 2 (two) times a week. Insert half gram vaginally     eucerin cream  Apply 1 application topically daily. Apply to bilateral feet     gabapentin 100 MG capsule  Commonly known as:  NEURONTIN  Take 200 mg by mouth at bedtime. Give two (2) 100 mg tablets to = 200 mg QHS     HYDROcodone-acetaminophen 5-325 MG tablet  Commonly known as:  NORCO/VICODIN  Take 1 tablet by mouth every 4 (four) hours as needed for moderate pain.     HYDROcodone-acetaminophen 5-325 MG tablet  Commonly known as:  NORCO/VICODIN  Take 1 tablet by mouth at bedtime. Do not exceed 4 gm of Tylenol in 24  hours     LORazepam 0.5 MG tablet  Commonly known as:  ATIVAN  Take one tablet by mouth every night at bedtime for anxiety     Melatonin 5 MG Tabs  Take 5 mg by mouth at bedtime.     mirabegron ER 25 MG Tb24 tablet  Commonly known as:  MYRBETRIQ  Take 25 mg by mouth at bedtime.     MIRALAX PO  Take by mouth at bedtime. 1/2 capful QHS     OCUVITE EYE + MULTI Tabs  Take 2 tablets by mouth daily.     omeprazole 20 MG capsule  Commonly known as:  PRILOSEC  Take 20 mg by mouth daily.     saccharomyces boulardii 250 MG capsule  Commonly known as:  FLORASTOR  Take 250 mg by mouth 2 (two) times daily. Take for 10 days     sodium chloride 0.65 % Soln nasal spray  Commonly known as:  OCEAN  Place 1-2 sprays into both nostrils as needed for congestion.     UNABLE TO FIND  Med Name: Med Pass 90 mL by mouth twice daily for nutritional support             Allergies  Allergen Reactions  . Codeine  Other (See Comments)    unknown  . Lexapro [Escitalopram] Nausea Only  . Macrobid [Nitrofurantoin Monohydrate Macrocrystals] Nausea Only    Nausea and headache  . Sulfa Antibiotics Other (See Comments)    dizziness     REVIEW OF SYSTEMS:  GENERAL: no change in appetite, no fatigue, no weight changes, no fever, chills or weakness RESPIRATORY: no cough, SOB, DOE, wheezing, hemoptysis CARDIAC: no chest pain, edema or palpitations GI: no abdominal pain, diarrhea, constipation, heart burn, nausea or vomiting  PHYSICAL EXAMINATION  GENERAL: no acute distress, normal body habitus SKIN:  Skin is warm and dry. EYES: conjunctivae normal, sclerae normal, normal eye lids NECK: supple, trachea midline, no neck masses, no thyroid tenderness, no thyromegaly LYMPHATICS: no LAN in the neck, no supraclavicular LAN RESPIRATORY: breathing is even & unlabored, BS CTAB CARDIAC: RRR, no murmur,no extra heart sounds, no edema GI: abdomen soft, normal BS, no masses, no tenderness, no hepatomegaly, no splenomegaly EXTREMITIES:  Able to move 4 extremities; generalized weakness BLE; contracted fingers on bilateral hands PSYCHIATRIC: the patient is alert & oriented to person, affect & behavior appropriate  LABS/RADIOLOGY:  Labs reviewed: Basic Metabolic Panel:  Recent Labs  11/30/14 1541 12/11/14 05/01/15 08/06/15  NA 139 142 143 143  K 3.6 4.2 3.9 3.7  CL 102  --   --   --   GLUCOSE 138*  --   --   --   BUN 15 11 14 15   CREATININE 0.70 0.4* 0.4* 0.4*   Liver Function Tests:  Recent Labs  05/01/15 08/06/15  AST 10* 10*  ALT 7 7  ALKPHOS 58 55   CBC:  Recent Labs  11/30/14 1528  12/25/14 05/01/15 08/06/15  WBC 9.3  < > 5.1 5.0 5.9  NEUTROABS  --   --   --   --  3  HGB 11.8*  < > 10.6* 11.9* 12.7  HCT 35.6*  < > 33* 37 38  MCV 94.7  --   --   --   --   PLT 186  < > 250 206 186  < > = values in this interval not displayed.  12/25/14  WBC 5.1 hemoglobin 10.6 hematocrit 32.9 MCV 92.7  platelet 250 12/11/14  WBC 5.8 hemoglobin 8.6 hematocrit 26.4 MCV 93.0 platelet 314 sodium 142 potassium 4.2 glucose 93 BUN 11 creatinine 0.43 calcium 8.4     ASSESSMENT/PLAN:  UTI - start Cipro 500 mg 1 tab PO BID X 7 days and Florastor 250 mg 1 capsule PO BID X 10 days    [facility], NP [employer] [phone]

## 2015-09-24 DIAGNOSIS — N3281 Overactive bladder: Secondary | ICD-10-CM | POA: Diagnosis not present

## 2015-09-24 DIAGNOSIS — G1221 Amyotrophic lateral sclerosis: Secondary | ICD-10-CM | POA: Diagnosis not present

## 2015-09-24 DIAGNOSIS — M2450 Contracture, unspecified joint: Secondary | ICD-10-CM | POA: Diagnosis not present

## 2015-09-24 DIAGNOSIS — I10 Essential (primary) hypertension: Secondary | ICD-10-CM | POA: Diagnosis not present

## 2015-09-24 DIAGNOSIS — E785 Hyperlipidemia, unspecified: Secondary | ICD-10-CM | POA: Diagnosis not present

## 2015-09-24 DIAGNOSIS — N952 Postmenopausal atrophic vaginitis: Secondary | ICD-10-CM | POA: Diagnosis not present

## 2015-09-24 DIAGNOSIS — R471 Dysarthria and anarthria: Secondary | ICD-10-CM | POA: Diagnosis not present

## 2015-09-24 DIAGNOSIS — J309 Allergic rhinitis, unspecified: Secondary | ICD-10-CM | POA: Diagnosis not present

## 2015-09-24 DIAGNOSIS — C4490 Unspecified malignant neoplasm of skin, unspecified: Secondary | ICD-10-CM | POA: Diagnosis not present

## 2015-09-24 DIAGNOSIS — E46 Unspecified protein-calorie malnutrition: Secondary | ICD-10-CM | POA: Diagnosis not present

## 2015-09-24 DIAGNOSIS — R1310 Dysphagia, unspecified: Secondary | ICD-10-CM | POA: Diagnosis not present

## 2015-09-24 DIAGNOSIS — R0600 Dyspnea, unspecified: Secondary | ICD-10-CM | POA: Diagnosis not present

## 2015-09-24 DIAGNOSIS — H3530 Unspecified macular degeneration: Secondary | ICD-10-CM | POA: Diagnosis not present

## 2015-09-25 DIAGNOSIS — N952 Postmenopausal atrophic vaginitis: Secondary | ICD-10-CM | POA: Diagnosis not present

## 2015-09-25 DIAGNOSIS — H3530 Unspecified macular degeneration: Secondary | ICD-10-CM | POA: Diagnosis not present

## 2015-09-25 DIAGNOSIS — R0600 Dyspnea, unspecified: Secondary | ICD-10-CM | POA: Diagnosis not present

## 2015-09-25 DIAGNOSIS — J309 Allergic rhinitis, unspecified: Secondary | ICD-10-CM | POA: Diagnosis not present

## 2015-09-25 DIAGNOSIS — E46 Unspecified protein-calorie malnutrition: Secondary | ICD-10-CM | POA: Diagnosis not present

## 2015-09-25 DIAGNOSIS — R471 Dysarthria and anarthria: Secondary | ICD-10-CM | POA: Diagnosis not present

## 2015-09-25 DIAGNOSIS — C4490 Unspecified malignant neoplasm of skin, unspecified: Secondary | ICD-10-CM | POA: Diagnosis not present

## 2015-09-25 DIAGNOSIS — G1221 Amyotrophic lateral sclerosis: Secondary | ICD-10-CM | POA: Diagnosis not present

## 2015-09-25 DIAGNOSIS — N3281 Overactive bladder: Secondary | ICD-10-CM | POA: Diagnosis not present

## 2015-09-25 DIAGNOSIS — E785 Hyperlipidemia, unspecified: Secondary | ICD-10-CM | POA: Diagnosis not present

## 2015-09-25 DIAGNOSIS — M2450 Contracture, unspecified joint: Secondary | ICD-10-CM | POA: Diagnosis not present

## 2015-09-25 DIAGNOSIS — R1310 Dysphagia, unspecified: Secondary | ICD-10-CM | POA: Diagnosis not present

## 2015-09-25 DIAGNOSIS — I10 Essential (primary) hypertension: Secondary | ICD-10-CM | POA: Diagnosis not present

## 2015-09-26 DIAGNOSIS — G1221 Amyotrophic lateral sclerosis: Secondary | ICD-10-CM | POA: Diagnosis not present

## 2015-09-26 DIAGNOSIS — R471 Dysarthria and anarthria: Secondary | ICD-10-CM | POA: Diagnosis not present

## 2015-09-26 DIAGNOSIS — C4490 Unspecified malignant neoplasm of skin, unspecified: Secondary | ICD-10-CM | POA: Diagnosis not present

## 2015-09-26 DIAGNOSIS — R1310 Dysphagia, unspecified: Secondary | ICD-10-CM | POA: Diagnosis not present

## 2015-09-26 DIAGNOSIS — J309 Allergic rhinitis, unspecified: Secondary | ICD-10-CM | POA: Diagnosis not present

## 2015-09-26 DIAGNOSIS — N952 Postmenopausal atrophic vaginitis: Secondary | ICD-10-CM | POA: Diagnosis not present

## 2015-09-26 DIAGNOSIS — I10 Essential (primary) hypertension: Secondary | ICD-10-CM | POA: Diagnosis not present

## 2015-09-26 DIAGNOSIS — M2450 Contracture, unspecified joint: Secondary | ICD-10-CM | POA: Diagnosis not present

## 2015-09-26 DIAGNOSIS — E46 Unspecified protein-calorie malnutrition: Secondary | ICD-10-CM | POA: Diagnosis not present

## 2015-09-26 DIAGNOSIS — R0600 Dyspnea, unspecified: Secondary | ICD-10-CM | POA: Diagnosis not present

## 2015-09-26 DIAGNOSIS — H3530 Unspecified macular degeneration: Secondary | ICD-10-CM | POA: Diagnosis not present

## 2015-09-26 DIAGNOSIS — E785 Hyperlipidemia, unspecified: Secondary | ICD-10-CM | POA: Diagnosis not present

## 2015-09-26 DIAGNOSIS — N3281 Overactive bladder: Secondary | ICD-10-CM | POA: Diagnosis not present

## 2015-09-27 DIAGNOSIS — E46 Unspecified protein-calorie malnutrition: Secondary | ICD-10-CM | POA: Diagnosis not present

## 2015-09-27 DIAGNOSIS — C4490 Unspecified malignant neoplasm of skin, unspecified: Secondary | ICD-10-CM | POA: Diagnosis not present

## 2015-09-27 DIAGNOSIS — R1310 Dysphagia, unspecified: Secondary | ICD-10-CM | POA: Diagnosis not present

## 2015-09-27 DIAGNOSIS — J309 Allergic rhinitis, unspecified: Secondary | ICD-10-CM | POA: Diagnosis not present

## 2015-09-27 DIAGNOSIS — I10 Essential (primary) hypertension: Secondary | ICD-10-CM | POA: Diagnosis not present

## 2015-09-27 DIAGNOSIS — E785 Hyperlipidemia, unspecified: Secondary | ICD-10-CM | POA: Diagnosis not present

## 2015-09-27 DIAGNOSIS — N952 Postmenopausal atrophic vaginitis: Secondary | ICD-10-CM | POA: Diagnosis not present

## 2015-09-27 DIAGNOSIS — R471 Dysarthria and anarthria: Secondary | ICD-10-CM | POA: Diagnosis not present

## 2015-09-27 DIAGNOSIS — N3281 Overactive bladder: Secondary | ICD-10-CM | POA: Diagnosis not present

## 2015-09-27 DIAGNOSIS — H3530 Unspecified macular degeneration: Secondary | ICD-10-CM | POA: Diagnosis not present

## 2015-09-27 DIAGNOSIS — G1221 Amyotrophic lateral sclerosis: Secondary | ICD-10-CM | POA: Diagnosis not present

## 2015-09-27 DIAGNOSIS — M2450 Contracture, unspecified joint: Secondary | ICD-10-CM | POA: Diagnosis not present

## 2015-09-27 DIAGNOSIS — R0600 Dyspnea, unspecified: Secondary | ICD-10-CM | POA: Diagnosis not present

## 2015-09-28 DIAGNOSIS — H3530 Unspecified macular degeneration: Secondary | ICD-10-CM | POA: Diagnosis not present

## 2015-09-28 DIAGNOSIS — G1221 Amyotrophic lateral sclerosis: Secondary | ICD-10-CM | POA: Diagnosis not present

## 2015-09-28 DIAGNOSIS — N952 Postmenopausal atrophic vaginitis: Secondary | ICD-10-CM | POA: Diagnosis not present

## 2015-09-28 DIAGNOSIS — E46 Unspecified protein-calorie malnutrition: Secondary | ICD-10-CM | POA: Diagnosis not present

## 2015-09-28 DIAGNOSIS — R0600 Dyspnea, unspecified: Secondary | ICD-10-CM | POA: Diagnosis not present

## 2015-09-28 DIAGNOSIS — E785 Hyperlipidemia, unspecified: Secondary | ICD-10-CM | POA: Diagnosis not present

## 2015-09-28 DIAGNOSIS — R471 Dysarthria and anarthria: Secondary | ICD-10-CM | POA: Diagnosis not present

## 2015-09-28 DIAGNOSIS — C4490 Unspecified malignant neoplasm of skin, unspecified: Secondary | ICD-10-CM | POA: Diagnosis not present

## 2015-09-28 DIAGNOSIS — N3281 Overactive bladder: Secondary | ICD-10-CM | POA: Diagnosis not present

## 2015-09-28 DIAGNOSIS — I10 Essential (primary) hypertension: Secondary | ICD-10-CM | POA: Diagnosis not present

## 2015-09-28 DIAGNOSIS — M2450 Contracture, unspecified joint: Secondary | ICD-10-CM | POA: Diagnosis not present

## 2015-09-28 DIAGNOSIS — J309 Allergic rhinitis, unspecified: Secondary | ICD-10-CM | POA: Diagnosis not present

## 2015-09-28 DIAGNOSIS — R1310 Dysphagia, unspecified: Secondary | ICD-10-CM | POA: Diagnosis not present

## 2015-09-29 DIAGNOSIS — R471 Dysarthria and anarthria: Secondary | ICD-10-CM | POA: Diagnosis not present

## 2015-09-29 DIAGNOSIS — C4490 Unspecified malignant neoplasm of skin, unspecified: Secondary | ICD-10-CM | POA: Diagnosis not present

## 2015-09-29 DIAGNOSIS — E785 Hyperlipidemia, unspecified: Secondary | ICD-10-CM | POA: Diagnosis not present

## 2015-09-29 DIAGNOSIS — H3530 Unspecified macular degeneration: Secondary | ICD-10-CM | POA: Diagnosis not present

## 2015-09-29 DIAGNOSIS — G1221 Amyotrophic lateral sclerosis: Secondary | ICD-10-CM | POA: Diagnosis not present

## 2015-09-29 DIAGNOSIS — E46 Unspecified protein-calorie malnutrition: Secondary | ICD-10-CM | POA: Diagnosis not present

## 2015-09-29 DIAGNOSIS — M2450 Contracture, unspecified joint: Secondary | ICD-10-CM | POA: Diagnosis not present

## 2015-09-29 DIAGNOSIS — N3281 Overactive bladder: Secondary | ICD-10-CM | POA: Diagnosis not present

## 2015-09-29 DIAGNOSIS — N952 Postmenopausal atrophic vaginitis: Secondary | ICD-10-CM | POA: Diagnosis not present

## 2015-09-29 DIAGNOSIS — R1310 Dysphagia, unspecified: Secondary | ICD-10-CM | POA: Diagnosis not present

## 2015-09-29 DIAGNOSIS — R0600 Dyspnea, unspecified: Secondary | ICD-10-CM | POA: Diagnosis not present

## 2015-09-29 DIAGNOSIS — I10 Essential (primary) hypertension: Secondary | ICD-10-CM | POA: Diagnosis not present

## 2015-09-29 DIAGNOSIS — J309 Allergic rhinitis, unspecified: Secondary | ICD-10-CM | POA: Diagnosis not present

## 2015-09-30 DIAGNOSIS — N952 Postmenopausal atrophic vaginitis: Secondary | ICD-10-CM | POA: Diagnosis not present

## 2015-09-30 DIAGNOSIS — E785 Hyperlipidemia, unspecified: Secondary | ICD-10-CM | POA: Diagnosis not present

## 2015-09-30 DIAGNOSIS — I10 Essential (primary) hypertension: Secondary | ICD-10-CM | POA: Diagnosis not present

## 2015-09-30 DIAGNOSIS — G1221 Amyotrophic lateral sclerosis: Secondary | ICD-10-CM | POA: Diagnosis not present

## 2015-09-30 DIAGNOSIS — H3530 Unspecified macular degeneration: Secondary | ICD-10-CM | POA: Diagnosis not present

## 2015-09-30 DIAGNOSIS — N3281 Overactive bladder: Secondary | ICD-10-CM | POA: Diagnosis not present

## 2015-09-30 DIAGNOSIS — R0600 Dyspnea, unspecified: Secondary | ICD-10-CM | POA: Diagnosis not present

## 2015-09-30 DIAGNOSIS — M2450 Contracture, unspecified joint: Secondary | ICD-10-CM | POA: Diagnosis not present

## 2015-09-30 DIAGNOSIS — R1310 Dysphagia, unspecified: Secondary | ICD-10-CM | POA: Diagnosis not present

## 2015-09-30 DIAGNOSIS — E46 Unspecified protein-calorie malnutrition: Secondary | ICD-10-CM | POA: Diagnosis not present

## 2015-09-30 DIAGNOSIS — C4490 Unspecified malignant neoplasm of skin, unspecified: Secondary | ICD-10-CM | POA: Diagnosis not present

## 2015-09-30 DIAGNOSIS — R471 Dysarthria and anarthria: Secondary | ICD-10-CM | POA: Diagnosis not present

## 2015-09-30 DIAGNOSIS — J309 Allergic rhinitis, unspecified: Secondary | ICD-10-CM | POA: Diagnosis not present

## 2015-10-01 DIAGNOSIS — C4490 Unspecified malignant neoplasm of skin, unspecified: Secondary | ICD-10-CM | POA: Diagnosis not present

## 2015-10-01 DIAGNOSIS — J309 Allergic rhinitis, unspecified: Secondary | ICD-10-CM | POA: Diagnosis not present

## 2015-10-01 DIAGNOSIS — R471 Dysarthria and anarthria: Secondary | ICD-10-CM | POA: Diagnosis not present

## 2015-10-01 DIAGNOSIS — H3530 Unspecified macular degeneration: Secondary | ICD-10-CM | POA: Diagnosis not present

## 2015-10-01 DIAGNOSIS — R1310 Dysphagia, unspecified: Secondary | ICD-10-CM | POA: Diagnosis not present

## 2015-10-01 DIAGNOSIS — H25813 Combined forms of age-related cataract, bilateral: Secondary | ICD-10-CM | POA: Diagnosis not present

## 2015-10-01 DIAGNOSIS — M2450 Contracture, unspecified joint: Secondary | ICD-10-CM | POA: Diagnosis not present

## 2015-10-01 DIAGNOSIS — E46 Unspecified protein-calorie malnutrition: Secondary | ICD-10-CM | POA: Diagnosis not present

## 2015-10-01 DIAGNOSIS — H353213 Exudative age-related macular degeneration, right eye, with inactive scar: Secondary | ICD-10-CM | POA: Diagnosis not present

## 2015-10-01 DIAGNOSIS — G1221 Amyotrophic lateral sclerosis: Secondary | ICD-10-CM | POA: Diagnosis not present

## 2015-10-01 DIAGNOSIS — N952 Postmenopausal atrophic vaginitis: Secondary | ICD-10-CM | POA: Diagnosis not present

## 2015-10-01 DIAGNOSIS — H353121 Nonexudative age-related macular degeneration, left eye, early dry stage: Secondary | ICD-10-CM | POA: Diagnosis not present

## 2015-10-01 DIAGNOSIS — H04123 Dry eye syndrome of bilateral lacrimal glands: Secondary | ICD-10-CM | POA: Diagnosis not present

## 2015-10-01 DIAGNOSIS — R0600 Dyspnea, unspecified: Secondary | ICD-10-CM | POA: Diagnosis not present

## 2015-10-01 DIAGNOSIS — I10 Essential (primary) hypertension: Secondary | ICD-10-CM | POA: Diagnosis not present

## 2015-10-01 DIAGNOSIS — N3281 Overactive bladder: Secondary | ICD-10-CM | POA: Diagnosis not present

## 2015-10-01 DIAGNOSIS — E785 Hyperlipidemia, unspecified: Secondary | ICD-10-CM | POA: Diagnosis not present

## 2015-10-02 ENCOUNTER — Encounter: Payer: Self-pay | Admitting: Adult Health

## 2015-10-02 ENCOUNTER — Non-Acute Institutional Stay (SKILLED_NURSING_FACILITY): Payer: Medicare Other | Admitting: Adult Health

## 2015-10-02 DIAGNOSIS — E538 Deficiency of other specified B group vitamins: Secondary | ICD-10-CM | POA: Diagnosis not present

## 2015-10-02 DIAGNOSIS — R0600 Dyspnea, unspecified: Secondary | ICD-10-CM | POA: Diagnosis not present

## 2015-10-02 DIAGNOSIS — K5900 Constipation, unspecified: Secondary | ICD-10-CM

## 2015-10-02 DIAGNOSIS — C4490 Unspecified malignant neoplasm of skin, unspecified: Secondary | ICD-10-CM | POA: Diagnosis not present

## 2015-10-02 DIAGNOSIS — S82201S Unspecified fracture of shaft of right tibia, sequela: Secondary | ICD-10-CM

## 2015-10-02 DIAGNOSIS — G1221 Amyotrophic lateral sclerosis: Secondary | ICD-10-CM

## 2015-10-02 DIAGNOSIS — K219 Gastro-esophageal reflux disease without esophagitis: Secondary | ICD-10-CM | POA: Diagnosis not present

## 2015-10-02 DIAGNOSIS — R1310 Dysphagia, unspecified: Secondary | ICD-10-CM | POA: Diagnosis not present

## 2015-10-02 DIAGNOSIS — E46 Unspecified protein-calorie malnutrition: Secondary | ICD-10-CM | POA: Diagnosis not present

## 2015-10-02 DIAGNOSIS — J309 Allergic rhinitis, unspecified: Secondary | ICD-10-CM | POA: Diagnosis not present

## 2015-10-02 DIAGNOSIS — G4700 Insomnia, unspecified: Secondary | ICD-10-CM | POA: Diagnosis not present

## 2015-10-02 DIAGNOSIS — F419 Anxiety disorder, unspecified: Secondary | ICD-10-CM

## 2015-10-02 DIAGNOSIS — I10 Essential (primary) hypertension: Secondary | ICD-10-CM

## 2015-10-02 DIAGNOSIS — G629 Polyneuropathy, unspecified: Secondary | ICD-10-CM | POA: Diagnosis not present

## 2015-10-02 DIAGNOSIS — N952 Postmenopausal atrophic vaginitis: Secondary | ICD-10-CM | POA: Diagnosis not present

## 2015-10-02 DIAGNOSIS — H3530 Unspecified macular degeneration: Secondary | ICD-10-CM | POA: Diagnosis not present

## 2015-10-02 DIAGNOSIS — S82291S Other fracture of shaft of right tibia, sequela: Secondary | ICD-10-CM

## 2015-10-02 DIAGNOSIS — M2450 Contracture, unspecified joint: Secondary | ICD-10-CM | POA: Diagnosis not present

## 2015-10-02 DIAGNOSIS — N3281 Overactive bladder: Secondary | ICD-10-CM

## 2015-10-02 DIAGNOSIS — R471 Dysarthria and anarthria: Secondary | ICD-10-CM | POA: Diagnosis not present

## 2015-10-02 DIAGNOSIS — E785 Hyperlipidemia, unspecified: Secondary | ICD-10-CM | POA: Diagnosis not present

## 2015-10-02 NOTE — Progress Notes (Signed)
Patient ID: Ashley Vaughan, female   DOB: [birth year], 82 y.o.   MRN: [id number]    DATE:    10/02/15  Facility:  Nursing Home Location:  [place] Room Number: [id number] LEVEL OF CARE:  SNF (31)   Chief Complaint  Patient presents with  . Medical Management of Chronic Issues    HISTORY OF PRESENT ILLNESS:  This is an 82 year old female who is being seen for a routine visit. She is comfort care/hospice and followed-up by [facility]. She just finished treatment with Cipro for UTI and was recently treated with Diflucan for vaginal yeast. Gabapentin was increased to 200 mg Q HS for neuropathy on lower extremities.   She has PMH of hypertension, osteoporosis, migraine, hyperlipidemia, ALS, vitamin B12 deficiency, macular degeneration and right hearing loss. She had a fall landing on her bilateral knees. Orthopedics was consulted and recommended nonsurgical intervention. She was diagnosed with ALS in 2014.   PAST MEDICAL HISTORY:  Past Medical History  Diagnosis Date  . Hypertension   . Osteoporosis   . Migraine (atenolol for prophylaxis)  . Macular degeneration   . Kidney stones, calcium oxalate 1993  . Pulmonary hypertension (HCC) mild,mild AR,mild-mod MR,mild-mod TR  . Hematuria, microscopic DrKimbrough 1993  . Hiatal hernia   . Diverticulosis (L sided) 1994  . Hyperlipidemia   . BCC (basal cell carcinoma of skin) L cheek-DrLupton (6/09)  . Postmenopausal bleeding 2010-DrCole  . Allergy     s/p immunotherapy  . Mitral regurgitation     mild-mod; mild aortic regurgitation, and mild-mod TR  . Atrophic vaginitis   . Hearing loss     right ear  . B12 deficiency   . ALS (amyotrophic lateral sclerosis) ([place])   . Neuropathy ([place])   . Closed right tibial fracture   . Anxiety   . GERD (gastroesophageal reflux disease)     Esophagitis presence not specified  . Insomnia   . Constipation   . OAB (overactive bladder)   . Postmenopausal HRT (hormone  replacement therapy)     CURRENT MEDICATIONS: Reviewed per MAR/see medication list   Medication List       This list is accurate as of: 10/02/15  3:52 PM.  Always use your most recent med list.               acetaminophen 325 MG tablet  Commonly known as:  TYLENOL  Take 650 mg by mouth every 4 (four) hours as needed for mild pain or fever (Fever >100).     amLODipine 5 MG tablet  Commonly known as:  NORVASC  Take 1 tablet (5 mg total) by mouth daily.     aspirin 81 MG chewable tablet  Chew 81 mg by mouth at bedtime.     B-12 5000 MCG Subl  Place 1 tablet under the tongue daily.     estrogens (conjugated) 0.625 MG tablet  Commonly known as:  PREMARIN  Place 0.625 mg vaginally 2 (two) times a week. Insert half gram vaginally     eucerin cream  Apply 1 application topically daily. Apply to bilateral feet     gabapentin 100 MG capsule  Commonly known as:  NEURONTIN  Take 200 mg by mouth at bedtime. Give two (2) 100 mg tablets to = 200 mg QHS     HYDROcodone-acetaminophen 5-325 MG tablet  Commonly known as:  NORCO/VICODIN  Take 1 tablet by mouth every 4 (four) hours as needed for moderate pain.  HYDROcodone-acetaminophen 5-325 MG tablet  Commonly known as:  NORCO/VICODIN  Take 1 tablet by mouth at bedtime. Do not exceed 4 gm of Tylenol in 24 hours     LORazepam 0.5 MG tablet  Commonly known as:  ATIVAN  Take one tablet by mouth every night at bedtime for anxiety     Melatonin 5 MG Tabs  Take 5 mg by mouth at bedtime.     mirabegron ER 25 MG Tb24 tablet  Commonly known as:  MYRBETRIQ  Take 25 mg by mouth at bedtime.     MIRALAX PO  Take by mouth at bedtime. 1/2 capful QHS     OCUVITE EYE + MULTI Tabs  Take 2 tablets by mouth daily.     omeprazole 20 MG capsule  Commonly known as:  PRILOSEC  Take 20 mg by mouth daily.     sodium chloride 0.65 % Soln nasal spray  Commonly known as:  OCEAN  Place 1-2 sprays into both nostrils as needed for congestion.      UNABLE TO FIND  Med Name: Med Pass 90 mL by mouth twice daily for nutritional support             Allergies  Allergen Reactions  . Codeine Other (See Comments)    unknown  . Lexapro [Escitalopram] Nausea Only  . Macrobid [Nitrofurantoin Monohydrate Macrocrystals] Nausea Only    Nausea and headache  . Sulfa Antibiotics Other (See Comments)    dizziness     REVIEW OF SYSTEMS:  GENERAL: no change in appetite, no fatigue, no weight changes, no fever, chills or weakness RESPIRATORY: no cough, SOB, DOE, wheezing, hemoptysis CARDIAC: no chest pain, edema or palpitations GI: no abdominal pain, diarrhea, constipation, heart burn, nausea or vomiting  PHYSICAL EXAMINATION  GENERAL: no acute distress, normal body habitus SKIN:  Skin is warm and dry. EYES: conjunctivae normal, sclerae normal, normal eye lids NECK: supple, trachea midline, no neck masses, no thyroid tenderness, no thyromegaly LYMPHATICS: no LAN in the neck, no supraclavicular LAN RESPIRATORY: breathing is even & unlabored, BS CTAB CARDIAC: RRR, no murmur,no extra heart sounds, no edema GI: abdomen soft, normal BS, no masses, no tenderness, no hepatomegaly, no splenomegaly EXTREMITIES:  Able to move 4 extremities; generalized weakness BLE; contracted fingers on bilateral hands PSYCHIATRIC: the patient is alert & oriented to person, affect & behavior appropriate  LABS/RADIOLOGY: Labs reviewed: Basic Metabolic Panel:  Recent Labs  11/30/14 1541 12/11/14 05/01/15 08/06/15  NA 139 142 143 143  K 3.6 4.2 3.9 3.7  CL 102  --   --   --   GLUCOSE 138*  --   --   --   BUN 15 11 14 15   CREATININE 0.70 0.4* 0.4* 0.4*   Liver Function Tests:  Recent Labs  05/01/15 08/06/15  AST 10* 10*  ALT 7 7  ALKPHOS 58 55   CBC:  Recent Labs  11/30/14 1528  12/25/14 05/01/15 08/06/15  WBC 9.3  < > 5.1 5.0 5.9  NEUTROABS  --   --   --   --  3  HGB 11.8*  < > 10.6* 11.9* 12.7  HCT 35.6*  < > 33* 37 38  MCV 94.7   --   --   --   --   PLT 186  < > 250 206 186  < > = values in this interval not displayed.  12/25/14  WBC 5.1 hemoglobin 10.6 hematocrit 32.9 MCV 92.7 platelet 250 12/11/14  WBC 5.8  hemoglobin 8.6 hematocrit 26.4 MCV 93.0 platelet 314 sodium 142 potassium 4.2 glucose 93 BUN 11 creatinine 0.43 calcium 8.4     ASSESSMENT/PLAN:  Hypertension  - continue Norvasc 5 mg 1 tab by mouth daily  GERD - continue Prilosec 20 mg 1 capsule by mouth daily  Neuropathy - continue Neurontin 200 mg 1 capsule Q HS   ALS  - on hospice/comfort care  Vitamin B12 deficiency - continue vitamin B12 5000 g 1 tab SL Q D  Anxiety - mood is stable; continue Ativan 0.5 mg 1 tab by mouth daily at bedtime  Closed right tibia  Fracture -  continue Norco 5/325 mg 1 tab by mouth daily at bedtime and every 4 hours when necessary for pain  Vaginal atrophy - continue Premarin vaginal cream apply 1/2 gm vaginally twice weekly   Urinary frequency - change Myrbetriq  25 mg 1 tab by mouth Q HS instead of AM  Constipation - continue  MiraLAX 8.5 g by mouth @ HS  Insomnia - continue melatonin to 5 mg 1 tab by mouth daily at bedtime       Goals of care:  Long-term care/hospice    [facility], NP [place]

## 2015-10-03 DIAGNOSIS — R471 Dysarthria and anarthria: Secondary | ICD-10-CM | POA: Diagnosis not present

## 2015-10-03 DIAGNOSIS — E46 Unspecified protein-calorie malnutrition: Secondary | ICD-10-CM | POA: Diagnosis not present

## 2015-10-03 DIAGNOSIS — R1310 Dysphagia, unspecified: Secondary | ICD-10-CM | POA: Diagnosis not present

## 2015-10-03 DIAGNOSIS — R0600 Dyspnea, unspecified: Secondary | ICD-10-CM | POA: Diagnosis not present

## 2015-10-03 DIAGNOSIS — I10 Essential (primary) hypertension: Secondary | ICD-10-CM | POA: Diagnosis not present

## 2015-10-03 DIAGNOSIS — J309 Allergic rhinitis, unspecified: Secondary | ICD-10-CM | POA: Diagnosis not present

## 2015-10-03 DIAGNOSIS — M2450 Contracture, unspecified joint: Secondary | ICD-10-CM | POA: Diagnosis not present

## 2015-10-03 DIAGNOSIS — E785 Hyperlipidemia, unspecified: Secondary | ICD-10-CM | POA: Diagnosis not present

## 2015-10-03 DIAGNOSIS — C4490 Unspecified malignant neoplasm of skin, unspecified: Secondary | ICD-10-CM | POA: Diagnosis not present

## 2015-10-03 DIAGNOSIS — N3281 Overactive bladder: Secondary | ICD-10-CM | POA: Diagnosis not present

## 2015-10-03 DIAGNOSIS — H3530 Unspecified macular degeneration: Secondary | ICD-10-CM | POA: Diagnosis not present

## 2015-10-03 DIAGNOSIS — N952 Postmenopausal atrophic vaginitis: Secondary | ICD-10-CM | POA: Diagnosis not present

## 2015-10-03 DIAGNOSIS — G1221 Amyotrophic lateral sclerosis: Secondary | ICD-10-CM | POA: Diagnosis not present

## 2015-10-04 DIAGNOSIS — E785 Hyperlipidemia, unspecified: Secondary | ICD-10-CM | POA: Diagnosis not present

## 2015-10-04 DIAGNOSIS — I10 Essential (primary) hypertension: Secondary | ICD-10-CM | POA: Diagnosis not present

## 2015-10-04 DIAGNOSIS — N390 Urinary tract infection, site not specified: Secondary | ICD-10-CM | POA: Diagnosis not present

## 2015-10-04 DIAGNOSIS — R471 Dysarthria and anarthria: Secondary | ICD-10-CM | POA: Diagnosis not present

## 2015-10-04 DIAGNOSIS — R0600 Dyspnea, unspecified: Secondary | ICD-10-CM | POA: Diagnosis not present

## 2015-10-04 DIAGNOSIS — G1221 Amyotrophic lateral sclerosis: Secondary | ICD-10-CM | POA: Diagnosis not present

## 2015-10-04 DIAGNOSIS — J309 Allergic rhinitis, unspecified: Secondary | ICD-10-CM | POA: Diagnosis not present

## 2015-10-04 DIAGNOSIS — C4490 Unspecified malignant neoplasm of skin, unspecified: Secondary | ICD-10-CM | POA: Diagnosis not present

## 2015-10-04 DIAGNOSIS — E46 Unspecified protein-calorie malnutrition: Secondary | ICD-10-CM | POA: Diagnosis not present

## 2015-10-04 DIAGNOSIS — H3530 Unspecified macular degeneration: Secondary | ICD-10-CM | POA: Diagnosis not present

## 2015-10-04 DIAGNOSIS — N3281 Overactive bladder: Secondary | ICD-10-CM | POA: Diagnosis not present

## 2015-10-04 DIAGNOSIS — R1310 Dysphagia, unspecified: Secondary | ICD-10-CM | POA: Diagnosis not present

## 2015-10-04 DIAGNOSIS — M2450 Contracture, unspecified joint: Secondary | ICD-10-CM | POA: Diagnosis not present

## 2015-10-04 DIAGNOSIS — N952 Postmenopausal atrophic vaginitis: Secondary | ICD-10-CM | POA: Diagnosis not present

## 2015-10-05 DIAGNOSIS — C4490 Unspecified malignant neoplasm of skin, unspecified: Secondary | ICD-10-CM | POA: Diagnosis not present

## 2015-10-05 DIAGNOSIS — I10 Essential (primary) hypertension: Secondary | ICD-10-CM | POA: Diagnosis not present

## 2015-10-05 DIAGNOSIS — M2450 Contracture, unspecified joint: Secondary | ICD-10-CM | POA: Diagnosis not present

## 2015-10-05 DIAGNOSIS — R0600 Dyspnea, unspecified: Secondary | ICD-10-CM | POA: Diagnosis not present

## 2015-10-05 DIAGNOSIS — N3281 Overactive bladder: Secondary | ICD-10-CM | POA: Diagnosis not present

## 2015-10-05 DIAGNOSIS — G1221 Amyotrophic lateral sclerosis: Secondary | ICD-10-CM | POA: Diagnosis not present

## 2015-10-05 DIAGNOSIS — H3530 Unspecified macular degeneration: Secondary | ICD-10-CM | POA: Diagnosis not present

## 2015-10-05 DIAGNOSIS — E785 Hyperlipidemia, unspecified: Secondary | ICD-10-CM | POA: Diagnosis not present

## 2015-10-05 DIAGNOSIS — E46 Unspecified protein-calorie malnutrition: Secondary | ICD-10-CM | POA: Diagnosis not present

## 2015-10-05 DIAGNOSIS — R1310 Dysphagia, unspecified: Secondary | ICD-10-CM | POA: Diagnosis not present

## 2015-10-05 DIAGNOSIS — J309 Allergic rhinitis, unspecified: Secondary | ICD-10-CM | POA: Diagnosis not present

## 2015-10-05 DIAGNOSIS — N952 Postmenopausal atrophic vaginitis: Secondary | ICD-10-CM | POA: Diagnosis not present

## 2015-10-05 DIAGNOSIS — R471 Dysarthria and anarthria: Secondary | ICD-10-CM | POA: Diagnosis not present

## 2015-10-06 DIAGNOSIS — M2450 Contracture, unspecified joint: Secondary | ICD-10-CM | POA: Diagnosis not present

## 2015-10-06 DIAGNOSIS — N3281 Overactive bladder: Secondary | ICD-10-CM | POA: Diagnosis not present

## 2015-10-06 DIAGNOSIS — I10 Essential (primary) hypertension: Secondary | ICD-10-CM | POA: Diagnosis not present

## 2015-10-06 DIAGNOSIS — J309 Allergic rhinitis, unspecified: Secondary | ICD-10-CM | POA: Diagnosis not present

## 2015-10-06 DIAGNOSIS — H3530 Unspecified macular degeneration: Secondary | ICD-10-CM | POA: Diagnosis not present

## 2015-10-06 DIAGNOSIS — C4490 Unspecified malignant neoplasm of skin, unspecified: Secondary | ICD-10-CM | POA: Diagnosis not present

## 2015-10-06 DIAGNOSIS — R1310 Dysphagia, unspecified: Secondary | ICD-10-CM | POA: Diagnosis not present

## 2015-10-06 DIAGNOSIS — R0600 Dyspnea, unspecified: Secondary | ICD-10-CM | POA: Diagnosis not present

## 2015-10-06 DIAGNOSIS — R471 Dysarthria and anarthria: Secondary | ICD-10-CM | POA: Diagnosis not present

## 2015-10-06 DIAGNOSIS — G1221 Amyotrophic lateral sclerosis: Secondary | ICD-10-CM | POA: Diagnosis not present

## 2015-10-06 DIAGNOSIS — N952 Postmenopausal atrophic vaginitis: Secondary | ICD-10-CM | POA: Diagnosis not present

## 2015-10-06 DIAGNOSIS — E46 Unspecified protein-calorie malnutrition: Secondary | ICD-10-CM | POA: Diagnosis not present

## 2015-10-06 DIAGNOSIS — E785 Hyperlipidemia, unspecified: Secondary | ICD-10-CM | POA: Diagnosis not present

## 2015-10-07 DIAGNOSIS — I10 Essential (primary) hypertension: Secondary | ICD-10-CM | POA: Diagnosis not present

## 2015-10-07 DIAGNOSIS — R1310 Dysphagia, unspecified: Secondary | ICD-10-CM | POA: Diagnosis not present

## 2015-10-07 DIAGNOSIS — R0600 Dyspnea, unspecified: Secondary | ICD-10-CM | POA: Diagnosis not present

## 2015-10-07 DIAGNOSIS — N952 Postmenopausal atrophic vaginitis: Secondary | ICD-10-CM | POA: Diagnosis not present

## 2015-10-07 DIAGNOSIS — M2450 Contracture, unspecified joint: Secondary | ICD-10-CM | POA: Diagnosis not present

## 2015-10-07 DIAGNOSIS — E46 Unspecified protein-calorie malnutrition: Secondary | ICD-10-CM | POA: Diagnosis not present

## 2015-10-07 DIAGNOSIS — J309 Allergic rhinitis, unspecified: Secondary | ICD-10-CM | POA: Diagnosis not present

## 2015-10-07 DIAGNOSIS — G1221 Amyotrophic lateral sclerosis: Secondary | ICD-10-CM | POA: Diagnosis not present

## 2015-10-07 DIAGNOSIS — H3530 Unspecified macular degeneration: Secondary | ICD-10-CM | POA: Diagnosis not present

## 2015-10-07 DIAGNOSIS — E785 Hyperlipidemia, unspecified: Secondary | ICD-10-CM | POA: Diagnosis not present

## 2015-10-07 DIAGNOSIS — C4490 Unspecified malignant neoplasm of skin, unspecified: Secondary | ICD-10-CM | POA: Diagnosis not present

## 2015-10-07 DIAGNOSIS — R471 Dysarthria and anarthria: Secondary | ICD-10-CM | POA: Diagnosis not present

## 2015-10-07 DIAGNOSIS — N3281 Overactive bladder: Secondary | ICD-10-CM | POA: Diagnosis not present

## 2015-10-08 DIAGNOSIS — H3530 Unspecified macular degeneration: Secondary | ICD-10-CM | POA: Diagnosis not present

## 2015-10-08 DIAGNOSIS — E785 Hyperlipidemia, unspecified: Secondary | ICD-10-CM | POA: Diagnosis not present

## 2015-10-08 DIAGNOSIS — R471 Dysarthria and anarthria: Secondary | ICD-10-CM | POA: Diagnosis not present

## 2015-10-08 DIAGNOSIS — J309 Allergic rhinitis, unspecified: Secondary | ICD-10-CM | POA: Diagnosis not present

## 2015-10-08 DIAGNOSIS — R1310 Dysphagia, unspecified: Secondary | ICD-10-CM | POA: Diagnosis not present

## 2015-10-08 DIAGNOSIS — I10 Essential (primary) hypertension: Secondary | ICD-10-CM | POA: Diagnosis not present

## 2015-10-08 DIAGNOSIS — R0600 Dyspnea, unspecified: Secondary | ICD-10-CM | POA: Diagnosis not present

## 2015-10-08 DIAGNOSIS — M2450 Contracture, unspecified joint: Secondary | ICD-10-CM | POA: Diagnosis not present

## 2015-10-08 DIAGNOSIS — E46 Unspecified protein-calorie malnutrition: Secondary | ICD-10-CM | POA: Diagnosis not present

## 2015-10-08 DIAGNOSIS — N952 Postmenopausal atrophic vaginitis: Secondary | ICD-10-CM | POA: Diagnosis not present

## 2015-10-08 DIAGNOSIS — C4490 Unspecified malignant neoplasm of skin, unspecified: Secondary | ICD-10-CM | POA: Diagnosis not present

## 2015-10-08 DIAGNOSIS — N3281 Overactive bladder: Secondary | ICD-10-CM | POA: Diagnosis not present

## 2015-10-08 DIAGNOSIS — G1221 Amyotrophic lateral sclerosis: Secondary | ICD-10-CM | POA: Diagnosis not present

## 2015-10-09 ENCOUNTER — Encounter: Payer: Self-pay | Admitting: Adult Health

## 2015-10-09 ENCOUNTER — Non-Acute Institutional Stay (SKILLED_NURSING_FACILITY): Payer: Medicare Other | Admitting: Adult Health

## 2015-10-09 DIAGNOSIS — I10 Essential (primary) hypertension: Secondary | ICD-10-CM | POA: Diagnosis not present

## 2015-10-09 DIAGNOSIS — H3530 Unspecified macular degeneration: Secondary | ICD-10-CM | POA: Diagnosis not present

## 2015-10-09 DIAGNOSIS — N3281 Overactive bladder: Secondary | ICD-10-CM | POA: Diagnosis not present

## 2015-10-09 DIAGNOSIS — C4490 Unspecified malignant neoplasm of skin, unspecified: Secondary | ICD-10-CM | POA: Diagnosis not present

## 2015-10-09 DIAGNOSIS — N952 Postmenopausal atrophic vaginitis: Secondary | ICD-10-CM | POA: Diagnosis not present

## 2015-10-09 DIAGNOSIS — M2450 Contracture, unspecified joint: Secondary | ICD-10-CM | POA: Diagnosis not present

## 2015-10-09 DIAGNOSIS — G1221 Amyotrophic lateral sclerosis: Secondary | ICD-10-CM | POA: Diagnosis not present

## 2015-10-09 DIAGNOSIS — N390 Urinary tract infection, site not specified: Secondary | ICD-10-CM | POA: Diagnosis not present

## 2015-10-09 DIAGNOSIS — R1310 Dysphagia, unspecified: Secondary | ICD-10-CM | POA: Diagnosis not present

## 2015-10-09 DIAGNOSIS — E785 Hyperlipidemia, unspecified: Secondary | ICD-10-CM | POA: Diagnosis not present

## 2015-10-09 DIAGNOSIS — E46 Unspecified protein-calorie malnutrition: Secondary | ICD-10-CM | POA: Diagnosis not present

## 2015-10-09 DIAGNOSIS — R0600 Dyspnea, unspecified: Secondary | ICD-10-CM | POA: Diagnosis not present

## 2015-10-09 DIAGNOSIS — J309 Allergic rhinitis, unspecified: Secondary | ICD-10-CM | POA: Diagnosis not present

## 2015-10-09 DIAGNOSIS — R471 Dysarthria and anarthria: Secondary | ICD-10-CM | POA: Diagnosis not present

## 2015-10-09 NOTE — Progress Notes (Signed)
Patient ID: Ashley Vaughan, female   DOB: [birth year], 82 y.o.   MRN: [id number]    DATE:    10/09/15  Facility:  Nursing Home Location:  [place] Room Number: [phone] LEVEL OF CARE:  SNF (31)   Chief Complaint  Patient presents with  . Acute Visit    UTI    HISTORY OF PRESENT ILLNESS:  This is an 82 year old female who was complaining of burning whenever she urinates. Urine culture showed > 100,000 CFU/ml E. Coli. ESBL positive. No fever nor hematuria has been noted.   PAST MEDICAL HISTORY:  Past Medical History  Diagnosis Date  . Hypertension   . Osteoporosis   . Migraine (atenolol for prophylaxis)  . Macular degeneration   . Kidney stones, calcium oxalate 1993  . Pulmonary hypertension (HCC) mild,mild AR,mild-mod MR,mild-mod TR  . Hematuria, microscopic DrKimbrough 1993  . Hiatal hernia   . Diverticulosis (L sided) 1994  . Hyperlipidemia   . BCC (basal cell carcinoma of skin) L cheek-DrLupton (6/09)  . Postmenopausal bleeding 2010-DrCole  . Allergy     s/p immunotherapy  . Mitral regurgitation     mild-mod; mild aortic regurgitation, and mild-mod TR  . Atrophic vaginitis   . Hearing loss     right ear  . B12 deficiency   . ALS (amyotrophic lateral sclerosis) ([place])   . Neuropathy ([place])   . Closed right tibial fracture   . Anxiety   . GERD (gastroesophageal reflux disease)     Esophagitis presence not specified  . Insomnia   . Constipation   . OAB (overactive bladder)   . Postmenopausal HRT (hormone replacement therapy)     CURRENT MEDICATIONS: Reviewed per MAR/see medication list   Medication List       This list is accurate as of: 10/09/15 11:59 PM.  Always use your most recent med list.               acetaminophen 325 MG tablet  Commonly known as:  TYLENOL  Take 650 mg by mouth every 4 (four) hours as needed for mild pain or fever (Fever >100).     amLODipine 5 MG tablet  Commonly known as:  NORVASC  Take 1  tablet (5 mg total) by mouth daily.     aspirin 81 MG chewable tablet  Chew 81 mg by mouth at bedtime.     B-12 5000 MCG Subl  Place 1 tablet under the tongue daily.     ertapenem 1 g injection  Commonly known as:  INVANZ  Inject 1 g into the muscle daily. For 7 days     estrogens (conjugated) 0.625 MG tablet  Commonly known as:  PREMARIN  Place 0.625 mg vaginally 2 (two) times a week. Insert half gram vaginally     eucerin cream  Apply 1 application topically daily. Apply to bilateral feet     gabapentin 100 MG capsule  Commonly known as:  NEURONTIN  Take 200 mg by mouth at bedtime. Give two (2) 100 mg tablets to = 200 mg QHS     HYDROcodone-acetaminophen 5-325 MG tablet  Commonly known as:  NORCO/VICODIN  Take 1 tablet by mouth every 4 (four) hours as needed for moderate pain.     HYDROcodone-acetaminophen 5-325 MG tablet  Commonly known as:  NORCO/VICODIN  Take 1 tablet by mouth at bedtime. Do not exceed 4 gm of Tylenol in 24 hours     LORazepam 0.5 MG  tablet  Commonly known as:  ATIVAN  Take one tablet by mouth every night at bedtime for anxiety     Melatonin 5 MG Tabs  Take 5 mg by mouth at bedtime.     mirabegron ER 25 MG Tb24 tablet  Commonly known as:  MYRBETRIQ  Take 25 mg by mouth at bedtime.     MIRALAX PO  Take by mouth at bedtime. 1/2 capful QHS     OCUVITE EYE + MULTI Tabs  Take 2 tablets by mouth daily.     PRESERVISION AREDS 2 Caps  Take 1 capsule by mouth 2 (two) times daily.     omeprazole 20 MG capsule  Commonly known as:  PRILOSEC  Take 20 mg by mouth daily.     REFRESH LIQUIGEL OP  Apply 1 drop to eye 2 (two) times daily. OU     saccharomyces boulardii 250 MG capsule  Commonly known as:  FLORASTOR  Take 250 mg by mouth 2 (two) times daily. Take for 10 days     sodium chloride 0.65 % Soln nasal spray  Commonly known as:  OCEAN  Place 1-2 sprays into both nostrils as needed for congestion.     UNABLE TO FIND  Med Name: Med Pass 90  mL by mouth twice daily for nutritional support             Allergies  Allergen Reactions  . Codeine Other (See Comments)    unknown  . Lexapro [Escitalopram] Nausea Only  . Macrobid [Nitrofurantoin Monohydrate Macrocrystals] Nausea Only    Nausea and headache  . Sulfa Antibiotics Other (See Comments)    dizziness     REVIEW OF SYSTEMS:  GENERAL: no change in appetite, no fatigue, no weight changes, no fever, chills or weakness RESPIRATORY: no cough, SOB, DOE, wheezing, hemoptysis CARDIAC: no chest pain, edema or palpitations GI: no abdominal pain, diarrhea, constipation, heart burn, nausea or vomiting  PHYSICAL EXAMINATION  GENERAL: no acute distress, normal body habitus SKIN:  Skin is warm and dry. EYES: conjunctivae normal, sclerae normal, normal eye lids NECK: supple, trachea midline, no neck masses, no thyroid tenderness, no thyromegaly LYMPHATICS: no LAN in the neck, no supraclavicular LAN RESPIRATORY: breathing is even & unlabored, BS CTAB CARDIAC: RRR, no murmur,no extra heart sounds, no edema GI: abdomen soft, normal BS, no masses, no tenderness, no hepatomegaly, no splenomegaly EXTREMITIES:  Able to move 4 extremities; generalized weakness BLE; contracted fingers on bilateral hands PSYCHIATRIC: the patient is alert & oriented to person, affect & behavior appropriate  LABS/RADIOLOGY: Labs reviewed: Basic Metabolic Panel:  Recent Labs  11/30/14 1541 12/11/14 05/01/15 08/06/15  NA 139 142 143 143  K 3.6 4.2 3.9 3.7  CL 102  --   --   --   GLUCOSE 138*  --   --   --   BUN 15 11 14 15   CREATININE 0.70 0.4* 0.4* 0.4*   Liver Function Tests:  Recent Labs  05/01/15 08/06/15  AST 10* 10*  ALT 7 7  ALKPHOS 58 55   CBC:  Recent Labs  11/30/14 1528  12/25/14 05/01/15 08/06/15  WBC 9.3  < > 5.1 5.0 5.9  NEUTROABS  --   --   --   --  3  HGB 11.8*  < > 10.6* 11.9* 12.7  HCT 35.6*  < > 33* 37 38  MCV 94.7  --   --   --   --   PLT 186  < > 250  206  186  < > = values in this interval not displayed.  12/25/14  WBC 5.1 hemoglobin 10.6 hematocrit 32.9 MCV 92.7 platelet 250 12/11/14  WBC 5.8 hemoglobin 8.6 hematocrit 26.4 MCV 93.0 platelet 314 sodium 142 potassium 4.2 glucose 93 BUN 11 creatinine 0.43 calcium 8.4     ASSESSMENT/PLAN:  UTI -  Start Ertapenem 1 gm IV Q D X 7 days and Florastor 250 mg 1 capsule PO BID X 10 days; contact precaution     Durenda Age, NP [employer] [phone]

## 2015-10-10 DIAGNOSIS — E46 Unspecified protein-calorie malnutrition: Secondary | ICD-10-CM | POA: Diagnosis not present

## 2015-10-10 DIAGNOSIS — E785 Hyperlipidemia, unspecified: Secondary | ICD-10-CM | POA: Diagnosis not present

## 2015-10-10 DIAGNOSIS — G1221 Amyotrophic lateral sclerosis: Secondary | ICD-10-CM | POA: Diagnosis not present

## 2015-10-10 DIAGNOSIS — I10 Essential (primary) hypertension: Secondary | ICD-10-CM | POA: Diagnosis not present

## 2015-10-10 DIAGNOSIS — R1310 Dysphagia, unspecified: Secondary | ICD-10-CM | POA: Diagnosis not present

## 2015-10-10 DIAGNOSIS — M2450 Contracture, unspecified joint: Secondary | ICD-10-CM | POA: Diagnosis not present

## 2015-10-10 DIAGNOSIS — R471 Dysarthria and anarthria: Secondary | ICD-10-CM | POA: Diagnosis not present

## 2015-10-10 DIAGNOSIS — R0600 Dyspnea, unspecified: Secondary | ICD-10-CM | POA: Diagnosis not present

## 2015-10-10 DIAGNOSIS — C4490 Unspecified malignant neoplasm of skin, unspecified: Secondary | ICD-10-CM | POA: Diagnosis not present

## 2015-10-10 DIAGNOSIS — N952 Postmenopausal atrophic vaginitis: Secondary | ICD-10-CM | POA: Diagnosis not present

## 2015-10-10 DIAGNOSIS — N3281 Overactive bladder: Secondary | ICD-10-CM | POA: Diagnosis not present

## 2015-10-10 DIAGNOSIS — J309 Allergic rhinitis, unspecified: Secondary | ICD-10-CM | POA: Diagnosis not present

## 2015-10-10 DIAGNOSIS — H3530 Unspecified macular degeneration: Secondary | ICD-10-CM | POA: Diagnosis not present

## 2015-10-11 DIAGNOSIS — R471 Dysarthria and anarthria: Secondary | ICD-10-CM | POA: Diagnosis not present

## 2015-10-11 DIAGNOSIS — C4490 Unspecified malignant neoplasm of skin, unspecified: Secondary | ICD-10-CM | POA: Diagnosis not present

## 2015-10-11 DIAGNOSIS — I10 Essential (primary) hypertension: Secondary | ICD-10-CM | POA: Diagnosis not present

## 2015-10-11 DIAGNOSIS — M2450 Contracture, unspecified joint: Secondary | ICD-10-CM | POA: Diagnosis not present

## 2015-10-11 DIAGNOSIS — E785 Hyperlipidemia, unspecified: Secondary | ICD-10-CM | POA: Diagnosis not present

## 2015-10-11 DIAGNOSIS — N952 Postmenopausal atrophic vaginitis: Secondary | ICD-10-CM | POA: Diagnosis not present

## 2015-10-11 DIAGNOSIS — R0600 Dyspnea, unspecified: Secondary | ICD-10-CM | POA: Diagnosis not present

## 2015-10-11 DIAGNOSIS — R1310 Dysphagia, unspecified: Secondary | ICD-10-CM | POA: Diagnosis not present

## 2015-10-11 DIAGNOSIS — N3281 Overactive bladder: Secondary | ICD-10-CM | POA: Diagnosis not present

## 2015-10-11 DIAGNOSIS — E46 Unspecified protein-calorie malnutrition: Secondary | ICD-10-CM | POA: Diagnosis not present

## 2015-10-11 DIAGNOSIS — G1221 Amyotrophic lateral sclerosis: Secondary | ICD-10-CM | POA: Diagnosis not present

## 2015-10-11 DIAGNOSIS — J309 Allergic rhinitis, unspecified: Secondary | ICD-10-CM | POA: Diagnosis not present

## 2015-10-11 DIAGNOSIS — H3530 Unspecified macular degeneration: Secondary | ICD-10-CM | POA: Diagnosis not present

## 2015-10-12 DIAGNOSIS — R0600 Dyspnea, unspecified: Secondary | ICD-10-CM | POA: Diagnosis not present

## 2015-10-12 DIAGNOSIS — G1221 Amyotrophic lateral sclerosis: Secondary | ICD-10-CM | POA: Diagnosis not present

## 2015-10-12 DIAGNOSIS — R1310 Dysphagia, unspecified: Secondary | ICD-10-CM | POA: Diagnosis not present

## 2015-10-12 DIAGNOSIS — E785 Hyperlipidemia, unspecified: Secondary | ICD-10-CM | POA: Diagnosis not present

## 2015-10-12 DIAGNOSIS — C4490 Unspecified malignant neoplasm of skin, unspecified: Secondary | ICD-10-CM | POA: Diagnosis not present

## 2015-10-12 DIAGNOSIS — M2450 Contracture, unspecified joint: Secondary | ICD-10-CM | POA: Diagnosis not present

## 2015-10-12 DIAGNOSIS — H3530 Unspecified macular degeneration: Secondary | ICD-10-CM | POA: Diagnosis not present

## 2015-10-12 DIAGNOSIS — N952 Postmenopausal atrophic vaginitis: Secondary | ICD-10-CM | POA: Diagnosis not present

## 2015-10-12 DIAGNOSIS — I10 Essential (primary) hypertension: Secondary | ICD-10-CM | POA: Diagnosis not present

## 2015-10-12 DIAGNOSIS — E46 Unspecified protein-calorie malnutrition: Secondary | ICD-10-CM | POA: Diagnosis not present

## 2015-10-12 DIAGNOSIS — N3281 Overactive bladder: Secondary | ICD-10-CM | POA: Diagnosis not present

## 2015-10-12 DIAGNOSIS — R471 Dysarthria and anarthria: Secondary | ICD-10-CM | POA: Diagnosis not present

## 2015-10-12 DIAGNOSIS — J309 Allergic rhinitis, unspecified: Secondary | ICD-10-CM | POA: Diagnosis not present

## 2015-10-13 DIAGNOSIS — E785 Hyperlipidemia, unspecified: Secondary | ICD-10-CM | POA: Diagnosis not present

## 2015-10-13 DIAGNOSIS — I10 Essential (primary) hypertension: Secondary | ICD-10-CM | POA: Diagnosis not present

## 2015-10-13 DIAGNOSIS — R1310 Dysphagia, unspecified: Secondary | ICD-10-CM | POA: Diagnosis not present

## 2015-10-13 DIAGNOSIS — M2450 Contracture, unspecified joint: Secondary | ICD-10-CM | POA: Diagnosis not present

## 2015-10-13 DIAGNOSIS — R471 Dysarthria and anarthria: Secondary | ICD-10-CM | POA: Diagnosis not present

## 2015-10-13 DIAGNOSIS — J309 Allergic rhinitis, unspecified: Secondary | ICD-10-CM | POA: Diagnosis not present

## 2015-10-13 DIAGNOSIS — E46 Unspecified protein-calorie malnutrition: Secondary | ICD-10-CM | POA: Diagnosis not present

## 2015-10-13 DIAGNOSIS — R0600 Dyspnea, unspecified: Secondary | ICD-10-CM | POA: Diagnosis not present

## 2015-10-13 DIAGNOSIS — C4490 Unspecified malignant neoplasm of skin, unspecified: Secondary | ICD-10-CM | POA: Diagnosis not present

## 2015-10-13 DIAGNOSIS — G1221 Amyotrophic lateral sclerosis: Secondary | ICD-10-CM | POA: Diagnosis not present

## 2015-10-13 DIAGNOSIS — H3530 Unspecified macular degeneration: Secondary | ICD-10-CM | POA: Diagnosis not present

## 2015-10-13 DIAGNOSIS — N952 Postmenopausal atrophic vaginitis: Secondary | ICD-10-CM | POA: Diagnosis not present

## 2015-10-13 DIAGNOSIS — N3281 Overactive bladder: Secondary | ICD-10-CM | POA: Diagnosis not present

## 2015-10-18 DIAGNOSIS — N390 Urinary tract infection, site not specified: Secondary | ICD-10-CM | POA: Diagnosis not present

## 2015-10-25 ENCOUNTER — Non-Acute Institutional Stay (SKILLED_NURSING_FACILITY): Payer: Medicare Other | Admitting: Adult Health

## 2015-10-25 ENCOUNTER — Encounter: Payer: Self-pay | Admitting: Adult Health

## 2015-10-25 DIAGNOSIS — G1221 Amyotrophic lateral sclerosis: Secondary | ICD-10-CM

## 2015-10-25 DIAGNOSIS — G4700 Insomnia, unspecified: Secondary | ICD-10-CM

## 2015-10-25 DIAGNOSIS — G629 Polyneuropathy, unspecified: Secondary | ICD-10-CM | POA: Diagnosis not present

## 2015-10-25 DIAGNOSIS — F419 Anxiety disorder, unspecified: Secondary | ICD-10-CM

## 2015-10-25 DIAGNOSIS — K219 Gastro-esophageal reflux disease without esophagitis: Secondary | ICD-10-CM

## 2015-10-25 DIAGNOSIS — N3281 Overactive bladder: Secondary | ICD-10-CM

## 2015-10-25 DIAGNOSIS — N952 Postmenopausal atrophic vaginitis: Secondary | ICD-10-CM | POA: Diagnosis not present

## 2015-10-25 DIAGNOSIS — I10 Essential (primary) hypertension: Secondary | ICD-10-CM | POA: Diagnosis not present

## 2015-10-25 DIAGNOSIS — K5900 Constipation, unspecified: Secondary | ICD-10-CM

## 2015-10-25 DIAGNOSIS — E538 Deficiency of other specified B group vitamins: Secondary | ICD-10-CM | POA: Diagnosis not present

## 2015-10-25 DIAGNOSIS — S82291S Other fracture of shaft of right tibia, sequela: Secondary | ICD-10-CM | POA: Diagnosis not present

## 2015-10-25 NOTE — Progress Notes (Signed)
Patient ID: ASHWINI TA, female   DOB: 1928-06-30, 82 y.o.   MRN: [id number]    DATE:  10/25/2015   MRN:  [id number]  BIRTHDAY: 1928-11-21  Facility:  Nursing Home Location:  [place] Room Number: [phone]  LEVEL OF CARE:  SNF [phone])  Contact Information    Name Relation Home Work [place] Son [phone]  [phone]   Anyely, Nealy Daughter [phone]        Code Status History    Date Active Date Inactive Code Status Order ID Comments User Context   11/30/2014  4:38 PM 12/03/2014  8:34 PM Full Code [id number]  Wylene Simmer, MD ED    Advance Directive Documentation        Most Recent Value   Type of Advance Directive  Out of facility DNR (pink MOST or yellow form)   Pre-existing out of facility DNR order (yellow form or pink MOST form)     "MOST" Form in Place?         Chief Complaint  Patient presents with  . Medical Management of Chronic Issues    HISTORY OF PRESENT ILLNESS:   This is an 82 year old female who is a long-term resident @ [facility]. She is being seen today for a routine visit. She was recently treated for UTI/+ESBL. Hypertension has been stable - 141/55, 128/64, 141/58, 131/85. She verbalized that her pain is well-controlled. She verbalized that she slept good last night. She continues to take Melatonin @ HS.  PAST MEDICAL HISTORY:  Past Medical History  Diagnosis Date  . Hypertension   . Osteoporosis   . Migraine (atenolol for prophylaxis)  . Macular degeneration   . Kidney stones, calcium oxalate 1993  . Pulmonary hypertension (HCC) mild,mild AR,mild-mod MR,mild-mod TR  . Hematuria, microscopic DrKimbrough 1993  . Hiatal hernia   . Diverticulosis (L sided) 1994  . Hyperlipidemia   . BCC (basal cell carcinoma of skin) L cheek-DrLupton (6/09)  . Postmenopausal bleeding 2010-DrCole  . Allergy     s/p immunotherapy  . Mitral regurgitation     mild-mod; mild aortic regurgitation,  and mild-mod TR  . Atrophic vaginitis   . Hearing loss     right ear  . B12 deficiency   . ALS (amyotrophic lateral sclerosis) ([place])   . Neuropathy ([place])   . Closed right tibial fracture   . Anxiety   . GERD (gastroesophageal reflux disease)     Esophagitis presence not specified  . Insomnia   . Constipation   . OAB (overactive bladder)   . Postmenopausal HRT (hormone replacement therapy)      CURRENT MEDICATIONS: Reviewed  Patient's Medications  New Prescriptions   No medications on file  Previous Medications   ACETAMINOPHEN (TYLENOL) 325 MG TABLET    Take 650 mg by mouth every 4 (four) hours as needed for mild pain or fever (Fever >100).   AMLODIPINE (NORVASC) 5 MG TABLET    Take 1 tablet (5 mg total) by mouth daily.   ASPIRIN 81 MG CHEWABLE TABLET    Chew 81 mg by mouth at bedtime.   CARBOXYMETHYLCELLULOSE SODIUM (REFRESH LIQUIGEL OP)    Apply 1 drop to eye 2 (two) times daily. OU   CONJUGATED ESTROGENS (PREMARIN) VAGINAL CREAM    Place 1 Applicatorful vaginally 2 (two) times a week.   CYANOCOBALAMIN (B-12) 5000 MCG SUBL    Place 1 tablet under the tongue daily.   GABAPENTIN (NEURONTIN) 100 MG  CAPSULE    Take 200 mg by mouth at bedtime. Give two (2) 100 mg tablets to = 200 mg QHS   HYDROCODONE-ACETAMINOPHEN (NORCO/VICODIN) 5-325 MG TABLET    Take 1 tablet by mouth every 4 (four) hours as needed for moderate pain.   HYDROCODONE-ACETAMINOPHEN (NORCO/VICODIN) 5-325 MG TABLET    Take 1 tablet by mouth at bedtime. Do not exceed 4 gm of Tylenol in 24 hours   LORAZEPAM (ATIVAN) 0.5 MG TABLET    Take one tablet by mouth every night at bedtime for anxiety   MELATONIN 5 MG TABS    Take 5 mg by mouth at bedtime.   MIRABEGRON ER (MYRBETRIQ) 25 MG TB24 TABLET    Take 25 mg by mouth at bedtime.    MULTIPLE VITAMINS-MINERALS (OCUVITE EYE + MULTI) TABS    Take 2 tablets by mouth daily.    OMEPRAZOLE (PRILOSEC) 20 MG CAPSULE    Take 20 mg by mouth daily.   POLYETHYLENE GLYCOL (MIRALAX /  GLYCOLAX) PACKET    Take 8.5 g by mouth at bedtime.   POLYETHYLENE GLYCOL (MIRALAX / GLYCOLAX) PACKET    Take 17 g by mouth daily as needed for mild constipation or moderate constipation.   SKIN PROTECTANTS, MISC. (EUCERIN) CREAM    Apply 1 application topically daily. Apply to bilateral feet   SODIUM CHLORIDE (OCEAN) 0.65 % SOLN NASAL SPRAY    Place 1-2 sprays into both nostrils as needed for congestion.    UNABLE TO FIND    Med Name: Med Pass 90 mL by mouth twice daily for nutritional support  Modified Medications   No medications on file  Discontinued Medications   ERTAPENEM (INVANZ) 1 G INJECTION    Inject 1 g into the muscle daily. For 7 days   ESTROGENS, CONJUGATED, (PREMARIN) 0.625 MG TABLET    Place 0.625 mg vaginally 2 (two) times a week. Insert half gram vaginally   MULTIPLE VITAMINS-MINERALS (PRESERVISION AREDS 2) CAPS    Take 1 capsule by mouth 2 (two) times daily.   POLYETHYLENE GLYCOL 3350 (MIRALAX PO)    Take by mouth at bedtime. 1/2 capful QHS   SACCHAROMYCES BOULARDII (FLORASTOR) 250 MG CAPSULE    Take 250 mg by mouth 2 (two) times daily. Take for 10 days     Allergies  Allergen Reactions  . Codeine Other (See Comments)    unknown  . Lexapro [Escitalopram] Nausea Only  . Macrobid [Nitrofurantoin Monohydrate Macrocrystals] Nausea Only    Nausea and headache  . Sulfa Antibiotics Other (See Comments)    dizziness     REVIEW OF SYSTEMS:  GENERAL: no change in appetite, no fatigue, no weight changes, no fever, chills or weakness EYES: Denies change in vision, dry eyes, eye pain, itching or discharge EARS: Denies change in hearing, ringing in ears, or earache NOSE: Denies nasal congestion or epistaxis MOUTH and THROAT: Denies oral discomfort, gingival pain or bleeding, pain from teeth or hoarseness   RESPIRATORY: no cough, SOB, DOE, wheezing, hemoptysis CARDIAC: no chest pain, edema or palpitations GI: no abdominal pain, diarrhea, constipation, heart burn, nausea or  vomiting PSYCHIATRIC: Denies feeling of depression or anxiety. No report of hallucinations, insomnia, paranoia, or agitation   PHYSICAL EXAMINATION  GENERAL APPEARANCE: Well nourished. In no acute distress. Normal body habitus SKIN:  Skin is warm and dry.  HEAD: Normal in size and contour. No evidence of trauma EYES: Lids open and close normally. No blepharitis, entropion or ectropion. PERRL. Conjunctivae are clear and sclerae  are white. Lenses are without opacity EARS: Pinnae are normal. Patient hears normal voice tunes of the examiner MOUTH and THROAT: Lips are without lesions. Oral mucosa is moist and without lesions. Tongue is normal in shape, size, and color and without lesions NECK: supple, trachea midline, no neck masses, no thyroid tenderness, no thyromegaly LYMPHATICS: no LAN in the neck, no supraclavicular LAN RESPIRATORY: breathing is even & unlabored, BS CTAB CARDIAC: RRR, no murmur,no extra heart sounds, no edema GI: abdomen soft, normal BS, no masses, no tenderness, no hepatomegaly, no splenomegaly EXTREMITIES:  Able to move X 4 extremities; generalized weakness BLE; contracted fingers on bilateral hands  PSYCHIATRIC: Alert and oriented X 3. Affect and behavior are appropriate  LABS/RADIOLOGY: Labs reviewed: Basic Metabolic Panel:  Recent Labs  11/30/14 1541 12/11/14 05/01/15 08/06/15  NA 139 142 143 143  K 3.6 4.2 3.9 3.7  CL 102  --   --   --   GLUCOSE 138*  --   --   --   BUN 15 11 14 15   CREATININE 0.70 0.4* 0.4* 0.4*   Liver Function Tests:  Recent Labs  05/01/15 08/06/15  AST 10* 10*  ALT 7 7  ALKPHOS 58 55   CBC:  Recent Labs  11/30/14 1528  12/25/14 05/01/15 08/06/15  WBC 9.3  < > 5.1 5.0 5.9  NEUTROABS  --   --   --   --  3  HGB 11.8*  < > 10.6* 11.9* 12.7  HCT 35.6*  < > 33* 37 38  MCV 94.7  --   --   --   --   PLT 186  < > 250 206 186  < > = values in this interval not displayed.   ASSESSMENT/PLAN:  OAB - continue Myrbetriq  25 mg 1  tab by mouth Q HS   Neuropathy - continue Neurontin 200 mg 1 capsule Q HS  Hypertension  - well-controlled; continue Norvasc 5 mg 1 tab by mouth daily  GERD - continue Prilosec 20 mg 1 capsule by mouth daily   ALS  - on hospice/comfort care  Vitamin B12 deficiency - continue vitamin B12 5000 g 1 tab SL Q D  Anxiety - mood is stable; continue Ativan 0.5 mg 1 tab by mouth daily at bedtime  Closed right tibia  Fracture -  continue Norco 5/325 mg 1 tab by mouth daily at bedtime and every 4 hours when necessary for pain  Vaginal atrophy - continue Premarin vaginal cream apply 1/2 gm vaginally twice weekly   Constipation - continue  MiraLAX 8.5 g by mouth @ HS; start prune juice on breakfast trays  Insomnia - continue melatonin to 5 mg 1 tab by mouth daily at bedtime    Goals of care:  Long-term care/hospice     Durenda Age, NP [place] [phone]

## 2015-10-28 IMAGING — DX DG KNEE COMPLETE 4+V*L*
4 series · 4 of 4 positions shown · non-contrast
Comparison: None.

CLINICAL DATA: Left anterior knee pain and swelling.

EXAM:
LEFT KNEE - COMPLETE 4+ VIEW; RIGHT KNEE - COMPLETE 4+ VIEW

[knee ap]
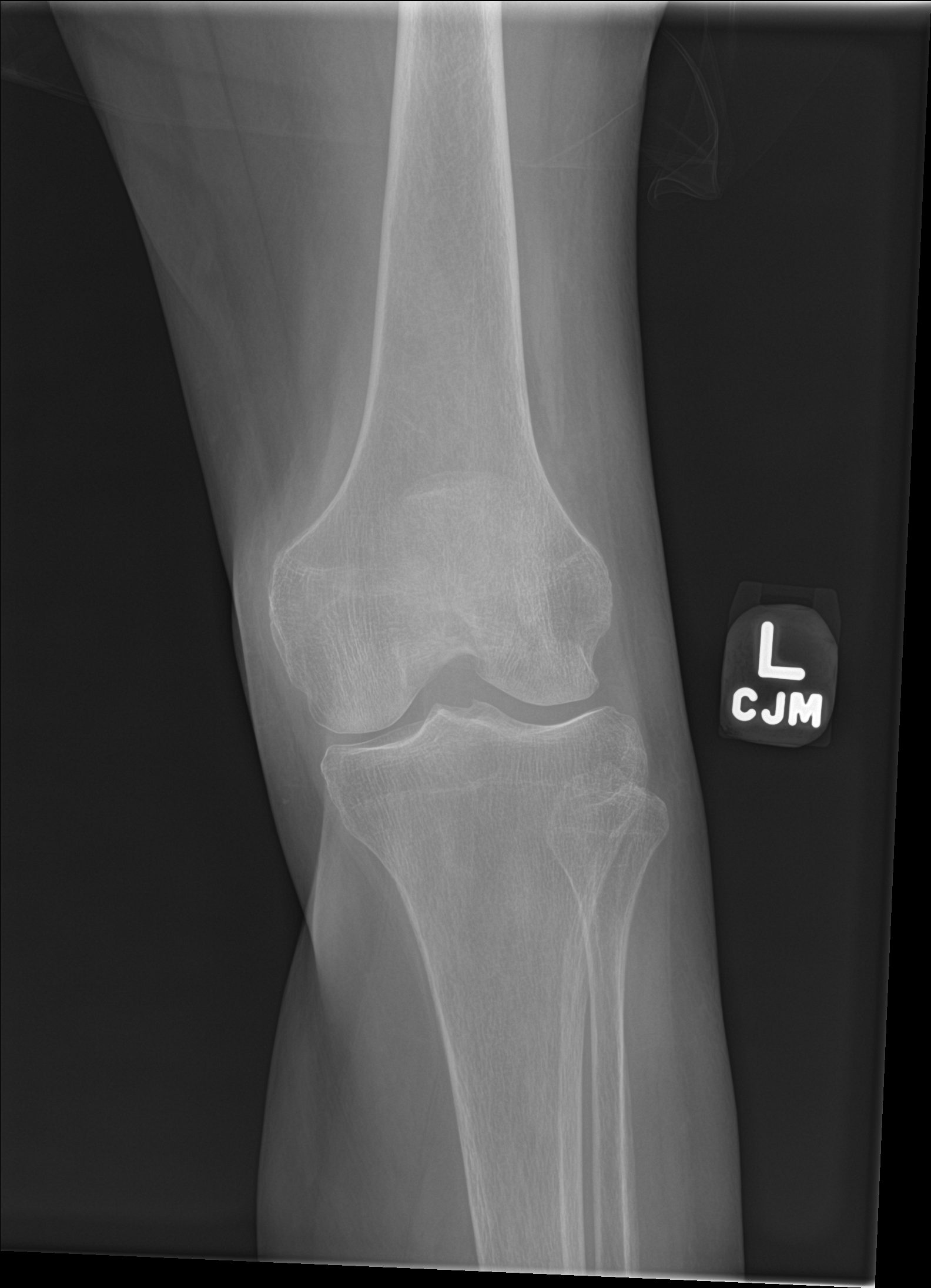

[knee lat]
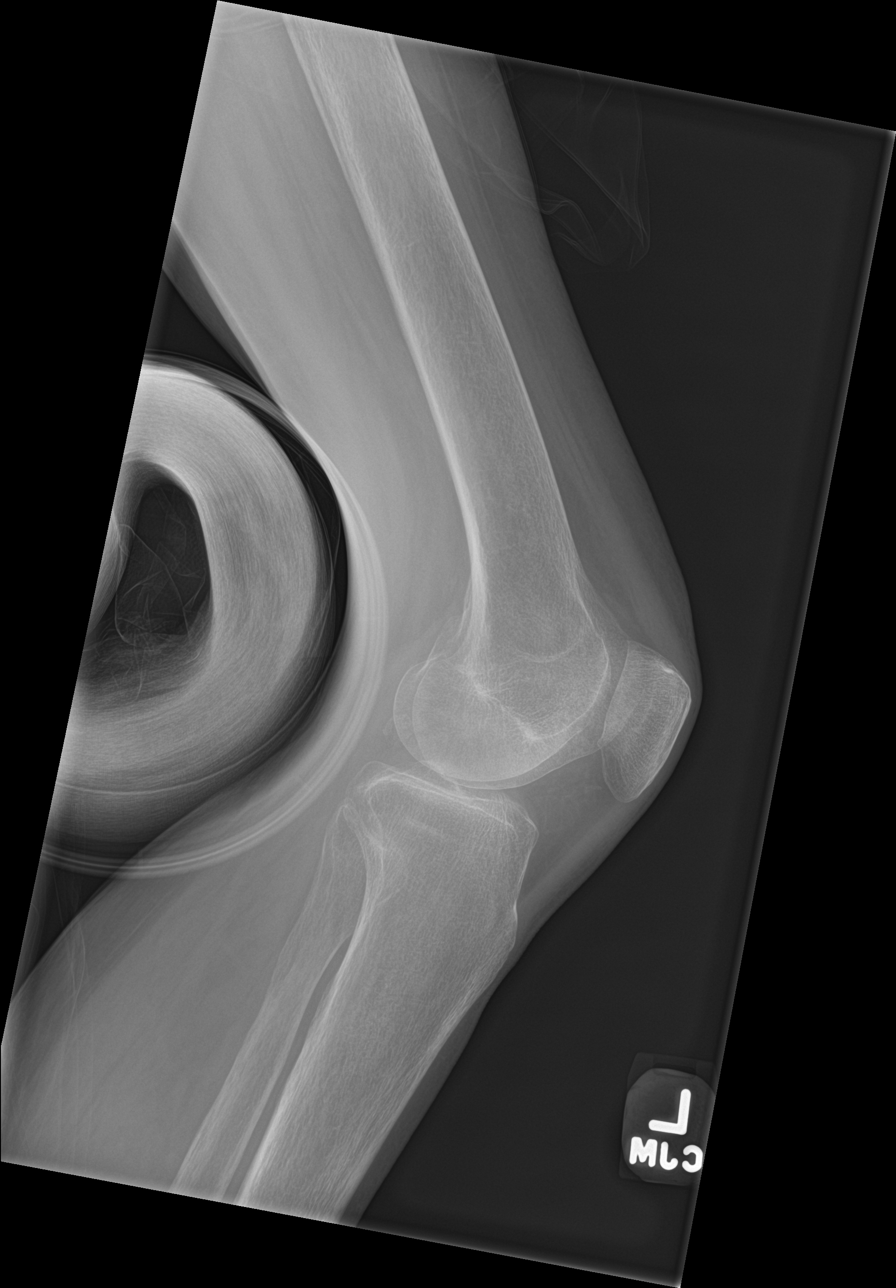

[knee obl (1 of 2)]
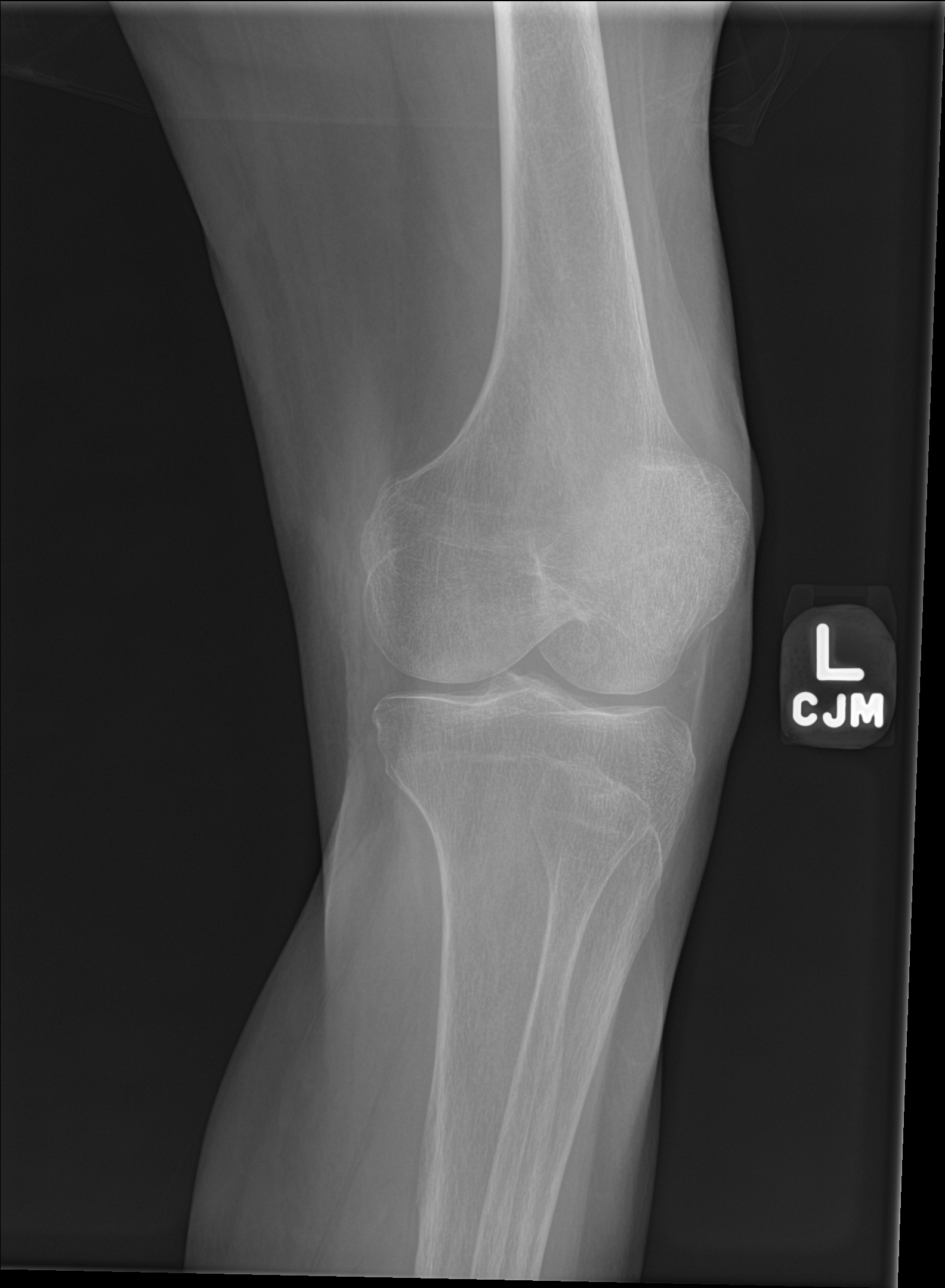

[knee obl (2 of 2)]
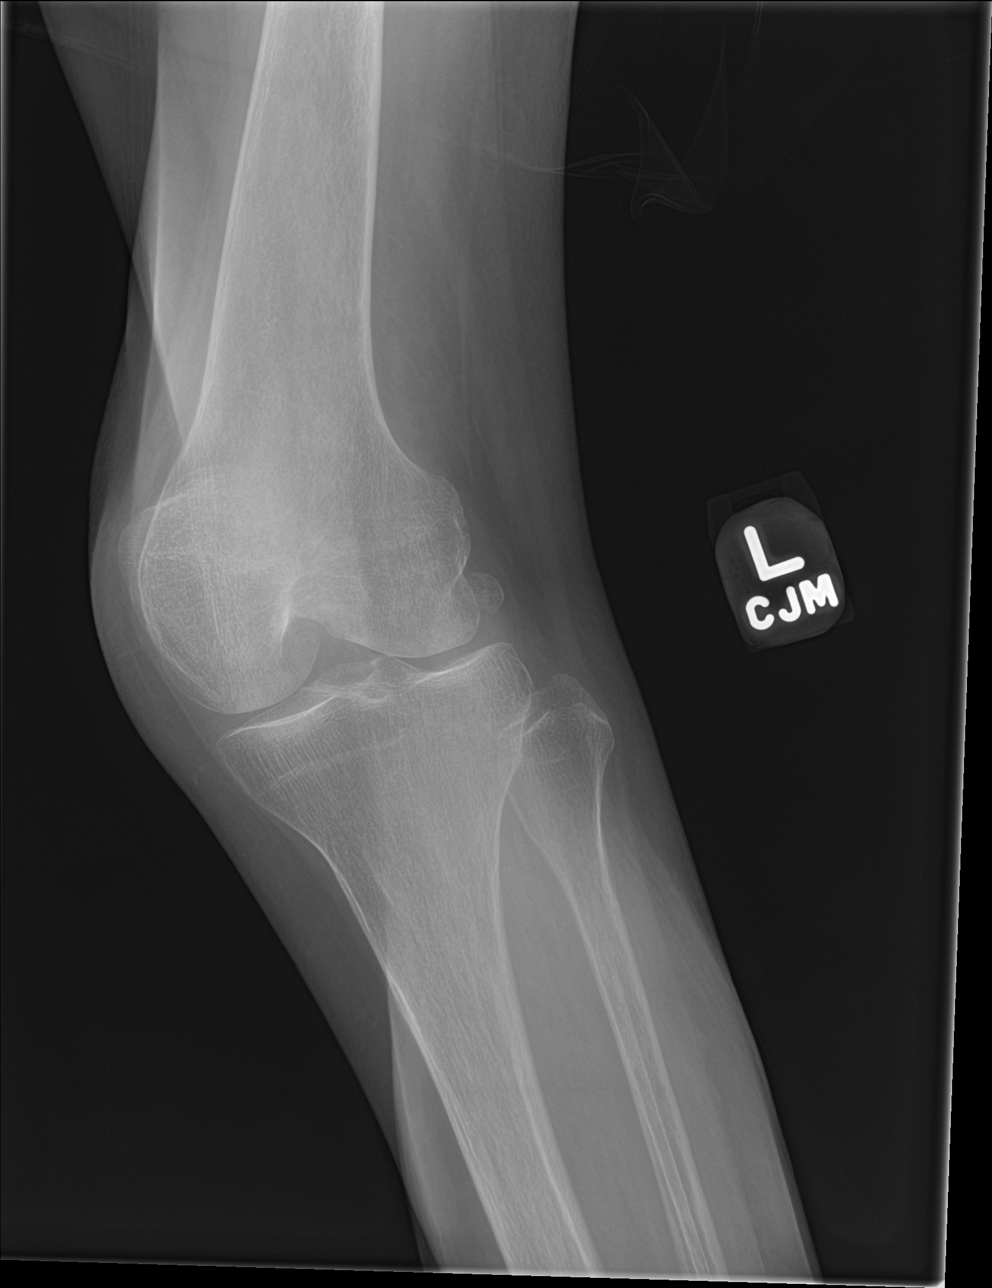

[4 of 4 positions shown; findings below may reference images not displayed]

FINDINGS: Right knee: There is a nondisplaced fracture of the proximal fibular
metaphysis. There is a nondisplaced fracture of the proximal tibial
metaphysis. There is a moderate size joint effusion. There is severe
medial compartment osteoarthritis with severe joint space narrowing
with bone-on-bone appearance, subchondral sclerosis and marginal
osteophytosis. There is patellofemoral compartment osteoarthritis.
There is generalized osteopenia. There is soft tissue swelling
around the proximal right lower leg.

Left knee: The left knee demonstrates no acute fracture or
dislocation. There is no significant joint effusion. The soft
tissues are normal. There is no lytic or sclerotic osseous lesion.
There is generalized osteopenia.
IMPRESSION: 1. Nondisplaced fractures of the proximal fibular metaphysis and
proximal tibial metaphysis.
2. No acute osseous injury of the left knee.
3. Severe osteoarthritis of the medial femorotibial compartment of
the right knee.

## 2015-10-29 DIAGNOSIS — B351 Tinea unguium: Secondary | ICD-10-CM | POA: Diagnosis not present

## 2015-10-29 DIAGNOSIS — M79672 Pain in left foot: Secondary | ICD-10-CM | POA: Diagnosis not present

## 2015-10-29 DIAGNOSIS — M79671 Pain in right foot: Secondary | ICD-10-CM | POA: Diagnosis not present

## 2015-11-14 DIAGNOSIS — E46 Unspecified protein-calorie malnutrition: Secondary | ICD-10-CM | POA: Diagnosis not present

## 2015-11-14 DIAGNOSIS — H3530 Unspecified macular degeneration: Secondary | ICD-10-CM | POA: Diagnosis not present

## 2015-11-14 DIAGNOSIS — J309 Allergic rhinitis, unspecified: Secondary | ICD-10-CM | POA: Diagnosis not present

## 2015-11-14 DIAGNOSIS — E785 Hyperlipidemia, unspecified: Secondary | ICD-10-CM | POA: Diagnosis not present

## 2015-11-14 DIAGNOSIS — M2450 Contracture, unspecified joint: Secondary | ICD-10-CM | POA: Diagnosis not present

## 2015-11-14 DIAGNOSIS — R471 Dysarthria and anarthria: Secondary | ICD-10-CM | POA: Diagnosis not present

## 2015-11-14 DIAGNOSIS — G1221 Amyotrophic lateral sclerosis: Secondary | ICD-10-CM | POA: Diagnosis not present

## 2015-11-14 DIAGNOSIS — R1310 Dysphagia, unspecified: Secondary | ICD-10-CM | POA: Diagnosis not present

## 2015-11-14 DIAGNOSIS — N3281 Overactive bladder: Secondary | ICD-10-CM | POA: Diagnosis not present

## 2015-11-14 DIAGNOSIS — C4490 Unspecified malignant neoplasm of skin, unspecified: Secondary | ICD-10-CM | POA: Diagnosis not present

## 2015-11-14 DIAGNOSIS — N952 Postmenopausal atrophic vaginitis: Secondary | ICD-10-CM | POA: Diagnosis not present

## 2015-11-14 DIAGNOSIS — R0600 Dyspnea, unspecified: Secondary | ICD-10-CM | POA: Diagnosis not present

## 2015-11-14 DIAGNOSIS — I10 Essential (primary) hypertension: Secondary | ICD-10-CM | POA: Diagnosis not present

## 2015-11-15 ENCOUNTER — Non-Acute Institutional Stay (SKILLED_NURSING_FACILITY): Payer: Medicare Other | Admitting: Internal Medicine

## 2015-11-15 ENCOUNTER — Encounter: Payer: Self-pay | Admitting: Internal Medicine

## 2015-11-15 DIAGNOSIS — J309 Allergic rhinitis, unspecified: Secondary | ICD-10-CM | POA: Diagnosis not present

## 2015-11-15 DIAGNOSIS — I10 Essential (primary) hypertension: Secondary | ICD-10-CM | POA: Diagnosis not present

## 2015-11-15 DIAGNOSIS — N952 Postmenopausal atrophic vaginitis: Secondary | ICD-10-CM | POA: Diagnosis not present

## 2015-11-15 DIAGNOSIS — M792 Neuralgia and neuritis, unspecified: Secondary | ICD-10-CM

## 2015-11-15 DIAGNOSIS — K219 Gastro-esophageal reflux disease without esophagitis: Secondary | ICD-10-CM

## 2015-11-15 DIAGNOSIS — C4490 Unspecified malignant neoplasm of skin, unspecified: Secondary | ICD-10-CM | POA: Diagnosis not present

## 2015-11-15 DIAGNOSIS — M2450 Contracture, unspecified joint: Secondary | ICD-10-CM | POA: Diagnosis not present

## 2015-11-15 DIAGNOSIS — R1310 Dysphagia, unspecified: Secondary | ICD-10-CM | POA: Diagnosis not present

## 2015-11-15 DIAGNOSIS — N3281 Overactive bladder: Secondary | ICD-10-CM

## 2015-11-15 DIAGNOSIS — G1221 Amyotrophic lateral sclerosis: Secondary | ICD-10-CM | POA: Diagnosis not present

## 2015-11-15 DIAGNOSIS — E46 Unspecified protein-calorie malnutrition: Secondary | ICD-10-CM

## 2015-11-15 DIAGNOSIS — E785 Hyperlipidemia, unspecified: Secondary | ICD-10-CM | POA: Diagnosis not present

## 2015-11-15 DIAGNOSIS — G4700 Insomnia, unspecified: Secondary | ICD-10-CM | POA: Diagnosis not present

## 2015-11-15 DIAGNOSIS — K5900 Constipation, unspecified: Secondary | ICD-10-CM | POA: Diagnosis not present

## 2015-11-15 DIAGNOSIS — H3530 Unspecified macular degeneration: Secondary | ICD-10-CM | POA: Diagnosis not present

## 2015-11-15 DIAGNOSIS — R0600 Dyspnea, unspecified: Secondary | ICD-10-CM | POA: Diagnosis not present

## 2015-11-15 DIAGNOSIS — R471 Dysarthria and anarthria: Secondary | ICD-10-CM | POA: Diagnosis not present

## 2015-11-15 NOTE — Progress Notes (Signed)
Patient ID: Ashley Vaughan, female   DOB: 11-29-1928, 82 y.o.   MRN: [id number]   [place]  Code Status: DNR  Allergies  Allergen Reactions  . Codeine Other (See Comments)    unknown  . Lexapro [Escitalopram] Nausea Only  . Macrobid [Nitrofurantoin Monohydrate Macrocrystals] Nausea Only    Nausea and headache  . Sulfa Antibiotics Other (See Comments)    dizziness   Chief Complaint  Patient presents with  . Medical Management of Chronic Issues    Routine Visit. Form fill out    HPI:  82 year old patient is seen today for routine visit. She is under hospice services. Facility requests a form to be filled out to help her benefits be managed.   Review of Systems:  Constitutional: Negative for fever, chills, diaphoresis.  HENT: Negative for headache, congestion, nasal discharge Gastrointestinal: Negative for nausea, vomiting, heartburn, abdominal pain. Positive for dysphagia.  Cardiology: Negative for chest pain Respiratory: Negative for cough. Positive for dyspnea with exertion.   Skin: Negative for itching, rash.  Neurological: Negative for dizziness, tingling, focal weakness GU: negative for dysuria Psychiatric/Behavioral: Negative for depression   Past Medical History  Diagnosis Date  . Hypertension   . Osteoporosis   . Migraine (atenolol for prophylaxis)  . Macular degeneration   . Kidney stones, calcium oxalate 1993  . Pulmonary hypertension (HCC) mild,mild AR,mild-mod MR,mild-mod TR  . Hematuria, microscopic DrKimbrough 1993  . Hiatal hernia   . Diverticulosis (L sided) 1994  . Hyperlipidemia   . BCC (basal cell carcinoma of skin) L cheek-DrLupton (6/09)  . Postmenopausal bleeding 2010-DrCole  . Allergy     s/p immunotherapy  . Mitral regurgitation     mild-mod; mild aortic regurgitation, and mild-mod TR  . Atrophic vaginitis   . Hearing loss     right ear  . B12 deficiency   . ALS (amyotrophic lateral sclerosis) ([place])   . Neuropathy  ([place])   . Closed right tibial fracture   . Anxiety   . GERD (gastroesophageal reflux disease)     Esophagitis presence not specified  . Insomnia   . Constipation   . OAB (overactive bladder)   . Postmenopausal HRT (hormone replacement therapy)        Medication List       This list is accurate as of: 11/15/15  4:12 PM.  Always use your most recent med list.               acetaminophen 325 MG tablet  Commonly known as:  TYLENOL  Take 650 mg by mouth every 4 (four) hours as needed for mild pain or fever (Fever >100).     amLODipine 5 MG tablet  Commonly known as:  NORVASC  Take 1 tablet (5 mg total) by mouth daily.     aspirin 81 MG chewable tablet  Chew 81 mg by mouth at bedtime.     B-12 5000 MCG Subl  Place 1 tablet under the tongue daily.     conjugated estrogens vaginal cream  Commonly known as:  PREMARIN  Place 1 Applicatorful vaginally 2 (two) times a week.     gabapentin 100 MG capsule  Commonly known as:  NEURONTIN  Take 200 mg by mouth at bedtime. Give two (2) 100 mg tablets to = 200 mg QHS     HYDROcodone-acetaminophen 5-325 MG tablet  Commonly known as:  NORCO/VICODIN  Take 1 tablet by mouth every 4 (four) hours as needed for  moderate pain.     HYDROcodone-acetaminophen 5-325 MG tablet  Commonly known as:  NORCO/VICODIN  Take 1 tablet by mouth at bedtime. Do not exceed 4 gm of Tylenol in 24 hours     LORazepam 0.5 MG tablet  Commonly known as:  ATIVAN  Take one tablet by mouth every night at bedtime for anxiety     Melatonin 5 MG Tabs  Take 5 mg by mouth at bedtime.     mirabegron ER 25 MG Tb24 tablet  Commonly known as:  MYRBETRIQ  Take 25 mg by mouth at bedtime.     nystatin powder  Generic drug:  nystatin  Apply topically 2 (two) times daily. 1 application to groin and inner thigh area     OCUVITE EYE + MULTI Tabs  Take 2 tablets by mouth daily.     omeprazole 20 MG capsule  Commonly known as:  PRILOSEC  Take 20 mg by mouth daily.       polyethylene glycol packet  Commonly known as:  MIRALAX / GLYCOLAX  Take 8.5 g by mouth at bedtime.     polyethylene glycol packet  Commonly known as:  MIRALAX / GLYCOLAX  Take 17 g by mouth daily as needed for mild constipation or moderate constipation.     REFRESH LIQUIGEL OP  Apply 1 drop to eye 2 (two) times daily. OU     sodium chloride 0.65 % Soln nasal spray  Commonly known as:  OCEAN  Place 1-2 sprays into both nostrils as needed for congestion.     UNABLE TO FIND  Med Name: Med Pass 90 mL by mouth twice daily for nutritional support        Physical exam BP 143/62 mmHg  Pulse 51  Temp(Src) 96.8 F (36 C) (Oral)  Resp 17  Ht 5\' 1"  (1.549 m)  Wt 99 lb (44.906 kg)  BMI 18.72 kg/m2  SpO2 97%  General- elderly female, thin built, in no acute distress Head- atraumatic, normocephalic Throat- moist mucus membrane, has dentures Neck- no cervical lymphadenopathy Cardiology- normal s1, s2 Respiratory- bilateral clear to auscultation, no wheeze, no rhonchi, no crackles, no use of accessory muscles Abdomen- bowel sounds present, soft, non tender Musculoskeletal- able to move all 4 extremities, both hands have contracture of fingers from ALS, right leg has a long splint, able to move her toes, good capillary refill, limited shoulder ROM Skin- warm and dry Psychiatry- alert and oriented to person, place and time  Labs- reviewed   Assessment/plan  Insomnia Continue melatonin at bedtime and monitor  OAB Continue myrbetriq 25 mg daily and monitor  Neuropathic pain Continue gabapentin and monitor  gerd Continue omeprazole for now, no changes made  Dysphagia With her ALS, aspiration precautions, assistance with feeding  Hypertension  Stable, continue Norvasc 5 mg daily  Protein calorie malnutrition Continue medpass supplement and assist with feed, monitor weight and po intake  Constipation Stable, continue miralax daily only as needed   [name],  MD Internal Medicine [facility] [place],  [address] Cell Phone (Monday-Friday 8 am - 5 pm): [phone] On Call: [phone] and follow prompts after 5 pm and on weekends Office Phone: [phone] Office Fax: [phone]

## 2015-11-16 DIAGNOSIS — H3530 Unspecified macular degeneration: Secondary | ICD-10-CM | POA: Diagnosis not present

## 2015-11-16 DIAGNOSIS — N952 Postmenopausal atrophic vaginitis: Secondary | ICD-10-CM | POA: Diagnosis not present

## 2015-11-16 DIAGNOSIS — C4490 Unspecified malignant neoplasm of skin, unspecified: Secondary | ICD-10-CM | POA: Diagnosis not present

## 2015-11-16 DIAGNOSIS — J309 Allergic rhinitis, unspecified: Secondary | ICD-10-CM | POA: Diagnosis not present

## 2015-11-16 DIAGNOSIS — R0600 Dyspnea, unspecified: Secondary | ICD-10-CM | POA: Diagnosis not present

## 2015-11-16 DIAGNOSIS — I10 Essential (primary) hypertension: Secondary | ICD-10-CM | POA: Diagnosis not present

## 2015-11-16 DIAGNOSIS — R471 Dysarthria and anarthria: Secondary | ICD-10-CM | POA: Diagnosis not present

## 2015-11-16 DIAGNOSIS — G1221 Amyotrophic lateral sclerosis: Secondary | ICD-10-CM | POA: Diagnosis not present

## 2015-11-16 DIAGNOSIS — M2450 Contracture, unspecified joint: Secondary | ICD-10-CM | POA: Diagnosis not present

## 2015-11-16 DIAGNOSIS — E46 Unspecified protein-calorie malnutrition: Secondary | ICD-10-CM | POA: Diagnosis not present

## 2015-11-16 DIAGNOSIS — N3281 Overactive bladder: Secondary | ICD-10-CM | POA: Diagnosis not present

## 2015-11-16 DIAGNOSIS — E785 Hyperlipidemia, unspecified: Secondary | ICD-10-CM | POA: Diagnosis not present

## 2015-11-16 DIAGNOSIS — R1310 Dysphagia, unspecified: Secondary | ICD-10-CM | POA: Diagnosis not present

## 2015-11-17 DIAGNOSIS — N3281 Overactive bladder: Secondary | ICD-10-CM | POA: Diagnosis not present

## 2015-11-17 DIAGNOSIS — E46 Unspecified protein-calorie malnutrition: Secondary | ICD-10-CM | POA: Diagnosis not present

## 2015-11-17 DIAGNOSIS — M2450 Contracture, unspecified joint: Secondary | ICD-10-CM | POA: Diagnosis not present

## 2015-11-17 DIAGNOSIS — N952 Postmenopausal atrophic vaginitis: Secondary | ICD-10-CM | POA: Diagnosis not present

## 2015-11-17 DIAGNOSIS — R1310 Dysphagia, unspecified: Secondary | ICD-10-CM | POA: Diagnosis not present

## 2015-11-17 DIAGNOSIS — E785 Hyperlipidemia, unspecified: Secondary | ICD-10-CM | POA: Diagnosis not present

## 2015-11-17 DIAGNOSIS — I10 Essential (primary) hypertension: Secondary | ICD-10-CM | POA: Diagnosis not present

## 2015-11-17 DIAGNOSIS — J309 Allergic rhinitis, unspecified: Secondary | ICD-10-CM | POA: Diagnosis not present

## 2015-11-17 DIAGNOSIS — H3530 Unspecified macular degeneration: Secondary | ICD-10-CM | POA: Diagnosis not present

## 2015-11-17 DIAGNOSIS — G1221 Amyotrophic lateral sclerosis: Secondary | ICD-10-CM | POA: Diagnosis not present

## 2015-11-17 DIAGNOSIS — R0600 Dyspnea, unspecified: Secondary | ICD-10-CM | POA: Diagnosis not present

## 2015-11-17 DIAGNOSIS — R471 Dysarthria and anarthria: Secondary | ICD-10-CM | POA: Diagnosis not present

## 2015-11-17 DIAGNOSIS — C4490 Unspecified malignant neoplasm of skin, unspecified: Secondary | ICD-10-CM | POA: Diagnosis not present

## 2015-11-18 ENCOUNTER — Other Ambulatory Visit: Payer: Self-pay | Admitting: *Deleted

## 2015-11-18 DIAGNOSIS — R1310 Dysphagia, unspecified: Secondary | ICD-10-CM | POA: Diagnosis not present

## 2015-11-18 DIAGNOSIS — R471 Dysarthria and anarthria: Secondary | ICD-10-CM | POA: Diagnosis not present

## 2015-11-18 DIAGNOSIS — R0600 Dyspnea, unspecified: Secondary | ICD-10-CM | POA: Diagnosis not present

## 2015-11-18 DIAGNOSIS — E46 Unspecified protein-calorie malnutrition: Secondary | ICD-10-CM | POA: Diagnosis not present

## 2015-11-18 DIAGNOSIS — G1221 Amyotrophic lateral sclerosis: Secondary | ICD-10-CM | POA: Diagnosis not present

## 2015-11-18 DIAGNOSIS — M2450 Contracture, unspecified joint: Secondary | ICD-10-CM | POA: Diagnosis not present

## 2015-11-18 DIAGNOSIS — N952 Postmenopausal atrophic vaginitis: Secondary | ICD-10-CM | POA: Diagnosis not present

## 2015-11-18 DIAGNOSIS — I10 Essential (primary) hypertension: Secondary | ICD-10-CM | POA: Diagnosis not present

## 2015-11-18 DIAGNOSIS — H3530 Unspecified macular degeneration: Secondary | ICD-10-CM | POA: Diagnosis not present

## 2015-11-18 DIAGNOSIS — N3281 Overactive bladder: Secondary | ICD-10-CM | POA: Diagnosis not present

## 2015-11-18 DIAGNOSIS — E785 Hyperlipidemia, unspecified: Secondary | ICD-10-CM | POA: Diagnosis not present

## 2015-11-18 DIAGNOSIS — J309 Allergic rhinitis, unspecified: Secondary | ICD-10-CM | POA: Diagnosis not present

## 2015-11-18 DIAGNOSIS — C4490 Unspecified malignant neoplasm of skin, unspecified: Secondary | ICD-10-CM | POA: Diagnosis not present

## 2015-11-18 MED ORDER — HYDROCODONE-ACETAMINOPHEN 5-325 MG PO TABS: 1.0000 | ORAL_TABLET | Freq: Every day | ORAL | Status: AC

## 2015-11-18 NOTE — Telephone Encounter (Signed)
[facility] 

## 2015-11-19 DIAGNOSIS — G1221 Amyotrophic lateral sclerosis: Secondary | ICD-10-CM | POA: Diagnosis not present

## 2015-11-19 DIAGNOSIS — E46 Unspecified protein-calorie malnutrition: Secondary | ICD-10-CM | POA: Diagnosis not present

## 2015-11-19 DIAGNOSIS — R1310 Dysphagia, unspecified: Secondary | ICD-10-CM | POA: Diagnosis not present

## 2015-11-19 DIAGNOSIS — R471 Dysarthria and anarthria: Secondary | ICD-10-CM | POA: Diagnosis not present

## 2015-11-19 DIAGNOSIS — J309 Allergic rhinitis, unspecified: Secondary | ICD-10-CM | POA: Diagnosis not present

## 2015-11-19 DIAGNOSIS — M2450 Contracture, unspecified joint: Secondary | ICD-10-CM | POA: Diagnosis not present

## 2015-11-19 DIAGNOSIS — R0600 Dyspnea, unspecified: Secondary | ICD-10-CM | POA: Diagnosis not present

## 2015-11-19 DIAGNOSIS — E785 Hyperlipidemia, unspecified: Secondary | ICD-10-CM | POA: Diagnosis not present

## 2015-11-19 DIAGNOSIS — H3530 Unspecified macular degeneration: Secondary | ICD-10-CM | POA: Diagnosis not present

## 2015-11-19 DIAGNOSIS — I10 Essential (primary) hypertension: Secondary | ICD-10-CM | POA: Diagnosis not present

## 2015-11-19 DIAGNOSIS — N3281 Overactive bladder: Secondary | ICD-10-CM | POA: Diagnosis not present

## 2015-11-19 DIAGNOSIS — N952 Postmenopausal atrophic vaginitis: Secondary | ICD-10-CM | POA: Diagnosis not present

## 2015-11-19 DIAGNOSIS — C4490 Unspecified malignant neoplasm of skin, unspecified: Secondary | ICD-10-CM | POA: Diagnosis not present

## 2015-11-20 DIAGNOSIS — M2450 Contracture, unspecified joint: Secondary | ICD-10-CM | POA: Diagnosis not present

## 2015-11-20 DIAGNOSIS — H3530 Unspecified macular degeneration: Secondary | ICD-10-CM | POA: Diagnosis not present

## 2015-11-20 DIAGNOSIS — G1221 Amyotrophic lateral sclerosis: Secondary | ICD-10-CM | POA: Diagnosis not present

## 2015-11-20 DIAGNOSIS — C4490 Unspecified malignant neoplasm of skin, unspecified: Secondary | ICD-10-CM | POA: Diagnosis not present

## 2015-11-20 DIAGNOSIS — N952 Postmenopausal atrophic vaginitis: Secondary | ICD-10-CM | POA: Diagnosis not present

## 2015-11-20 DIAGNOSIS — R471 Dysarthria and anarthria: Secondary | ICD-10-CM | POA: Diagnosis not present

## 2015-11-20 DIAGNOSIS — J309 Allergic rhinitis, unspecified: Secondary | ICD-10-CM | POA: Diagnosis not present

## 2015-11-20 DIAGNOSIS — R0600 Dyspnea, unspecified: Secondary | ICD-10-CM | POA: Diagnosis not present

## 2015-11-20 DIAGNOSIS — E46 Unspecified protein-calorie malnutrition: Secondary | ICD-10-CM | POA: Diagnosis not present

## 2015-11-20 DIAGNOSIS — I10 Essential (primary) hypertension: Secondary | ICD-10-CM | POA: Diagnosis not present

## 2015-11-20 DIAGNOSIS — R1310 Dysphagia, unspecified: Secondary | ICD-10-CM | POA: Diagnosis not present

## 2015-11-20 DIAGNOSIS — N3281 Overactive bladder: Secondary | ICD-10-CM | POA: Diagnosis not present

## 2015-11-20 DIAGNOSIS — E785 Hyperlipidemia, unspecified: Secondary | ICD-10-CM | POA: Diagnosis not present

## 2015-11-21 DIAGNOSIS — E785 Hyperlipidemia, unspecified: Secondary | ICD-10-CM | POA: Diagnosis not present

## 2015-11-21 DIAGNOSIS — E46 Unspecified protein-calorie malnutrition: Secondary | ICD-10-CM | POA: Diagnosis not present

## 2015-11-21 DIAGNOSIS — H3530 Unspecified macular degeneration: Secondary | ICD-10-CM | POA: Diagnosis not present

## 2015-11-21 DIAGNOSIS — R471 Dysarthria and anarthria: Secondary | ICD-10-CM | POA: Diagnosis not present

## 2015-11-21 DIAGNOSIS — I10 Essential (primary) hypertension: Secondary | ICD-10-CM | POA: Diagnosis not present

## 2015-11-21 DIAGNOSIS — M2450 Contracture, unspecified joint: Secondary | ICD-10-CM | POA: Diagnosis not present

## 2015-11-21 DIAGNOSIS — N952 Postmenopausal atrophic vaginitis: Secondary | ICD-10-CM | POA: Diagnosis not present

## 2015-11-21 DIAGNOSIS — J309 Allergic rhinitis, unspecified: Secondary | ICD-10-CM | POA: Diagnosis not present

## 2015-11-21 DIAGNOSIS — R0600 Dyspnea, unspecified: Secondary | ICD-10-CM | POA: Diagnosis not present

## 2015-11-21 DIAGNOSIS — G1221 Amyotrophic lateral sclerosis: Secondary | ICD-10-CM | POA: Diagnosis not present

## 2015-11-21 DIAGNOSIS — N3281 Overactive bladder: Secondary | ICD-10-CM | POA: Diagnosis not present

## 2015-11-21 DIAGNOSIS — R1310 Dysphagia, unspecified: Secondary | ICD-10-CM | POA: Diagnosis not present

## 2015-11-21 DIAGNOSIS — C4490 Unspecified malignant neoplasm of skin, unspecified: Secondary | ICD-10-CM | POA: Diagnosis not present

## 2015-11-22 DIAGNOSIS — M2450 Contracture, unspecified joint: Secondary | ICD-10-CM | POA: Diagnosis not present

## 2015-11-22 DIAGNOSIS — R1310 Dysphagia, unspecified: Secondary | ICD-10-CM | POA: Diagnosis not present

## 2015-11-22 DIAGNOSIS — H3530 Unspecified macular degeneration: Secondary | ICD-10-CM | POA: Diagnosis not present

## 2015-11-22 DIAGNOSIS — G1221 Amyotrophic lateral sclerosis: Secondary | ICD-10-CM | POA: Diagnosis not present

## 2015-11-22 DIAGNOSIS — J309 Allergic rhinitis, unspecified: Secondary | ICD-10-CM | POA: Diagnosis not present

## 2015-11-22 DIAGNOSIS — R0600 Dyspnea, unspecified: Secondary | ICD-10-CM | POA: Diagnosis not present

## 2015-11-22 DIAGNOSIS — E785 Hyperlipidemia, unspecified: Secondary | ICD-10-CM | POA: Diagnosis not present

## 2015-11-22 DIAGNOSIS — N3281 Overactive bladder: Secondary | ICD-10-CM | POA: Diagnosis not present

## 2015-11-22 DIAGNOSIS — R471 Dysarthria and anarthria: Secondary | ICD-10-CM | POA: Diagnosis not present

## 2015-11-22 DIAGNOSIS — E46 Unspecified protein-calorie malnutrition: Secondary | ICD-10-CM | POA: Diagnosis not present

## 2015-11-22 DIAGNOSIS — C4490 Unspecified malignant neoplasm of skin, unspecified: Secondary | ICD-10-CM | POA: Diagnosis not present

## 2015-11-22 DIAGNOSIS — N952 Postmenopausal atrophic vaginitis: Secondary | ICD-10-CM | POA: Diagnosis not present

## 2015-11-22 DIAGNOSIS — I10 Essential (primary) hypertension: Secondary | ICD-10-CM | POA: Diagnosis not present

## 2015-11-23 DIAGNOSIS — R471 Dysarthria and anarthria: Secondary | ICD-10-CM | POA: Diagnosis not present

## 2015-11-23 DIAGNOSIS — E46 Unspecified protein-calorie malnutrition: Secondary | ICD-10-CM | POA: Diagnosis not present

## 2015-11-23 DIAGNOSIS — I10 Essential (primary) hypertension: Secondary | ICD-10-CM | POA: Diagnosis not present

## 2015-11-23 DIAGNOSIS — R1310 Dysphagia, unspecified: Secondary | ICD-10-CM | POA: Diagnosis not present

## 2015-11-23 DIAGNOSIS — H3530 Unspecified macular degeneration: Secondary | ICD-10-CM | POA: Diagnosis not present

## 2015-11-23 DIAGNOSIS — J309 Allergic rhinitis, unspecified: Secondary | ICD-10-CM | POA: Diagnosis not present

## 2015-11-23 DIAGNOSIS — M2450 Contracture, unspecified joint: Secondary | ICD-10-CM | POA: Diagnosis not present

## 2015-11-23 DIAGNOSIS — N952 Postmenopausal atrophic vaginitis: Secondary | ICD-10-CM | POA: Diagnosis not present

## 2015-11-23 DIAGNOSIS — R0600 Dyspnea, unspecified: Secondary | ICD-10-CM | POA: Diagnosis not present

## 2015-11-23 DIAGNOSIS — E785 Hyperlipidemia, unspecified: Secondary | ICD-10-CM | POA: Diagnosis not present

## 2015-11-23 DIAGNOSIS — G1221 Amyotrophic lateral sclerosis: Secondary | ICD-10-CM | POA: Diagnosis not present

## 2015-11-23 DIAGNOSIS — N3281 Overactive bladder: Secondary | ICD-10-CM | POA: Diagnosis not present

## 2015-11-23 DIAGNOSIS — C4490 Unspecified malignant neoplasm of skin, unspecified: Secondary | ICD-10-CM | POA: Diagnosis not present

## 2015-11-24 DIAGNOSIS — G1221 Amyotrophic lateral sclerosis: Secondary | ICD-10-CM | POA: Diagnosis not present

## 2015-11-24 DIAGNOSIS — N3281 Overactive bladder: Secondary | ICD-10-CM | POA: Diagnosis not present

## 2015-11-24 DIAGNOSIS — E46 Unspecified protein-calorie malnutrition: Secondary | ICD-10-CM | POA: Diagnosis not present

## 2015-11-24 DIAGNOSIS — E785 Hyperlipidemia, unspecified: Secondary | ICD-10-CM | POA: Diagnosis not present

## 2015-11-24 DIAGNOSIS — N952 Postmenopausal atrophic vaginitis: Secondary | ICD-10-CM | POA: Diagnosis not present

## 2015-11-24 DIAGNOSIS — I10 Essential (primary) hypertension: Secondary | ICD-10-CM | POA: Diagnosis not present

## 2015-11-24 DIAGNOSIS — R0600 Dyspnea, unspecified: Secondary | ICD-10-CM | POA: Diagnosis not present

## 2015-11-24 DIAGNOSIS — J309 Allergic rhinitis, unspecified: Secondary | ICD-10-CM | POA: Diagnosis not present

## 2015-11-24 DIAGNOSIS — R1310 Dysphagia, unspecified: Secondary | ICD-10-CM | POA: Diagnosis not present

## 2015-11-24 DIAGNOSIS — C4490 Unspecified malignant neoplasm of skin, unspecified: Secondary | ICD-10-CM | POA: Diagnosis not present

## 2015-11-24 DIAGNOSIS — R471 Dysarthria and anarthria: Secondary | ICD-10-CM | POA: Diagnosis not present

## 2015-11-24 DIAGNOSIS — H3530 Unspecified macular degeneration: Secondary | ICD-10-CM | POA: Diagnosis not present

## 2015-11-24 DIAGNOSIS — M2450 Contracture, unspecified joint: Secondary | ICD-10-CM | POA: Diagnosis not present

## 2015-11-25 DIAGNOSIS — N952 Postmenopausal atrophic vaginitis: Secondary | ICD-10-CM | POA: Diagnosis not present

## 2015-11-25 DIAGNOSIS — M2450 Contracture, unspecified joint: Secondary | ICD-10-CM | POA: Diagnosis not present

## 2015-11-25 DIAGNOSIS — H3530 Unspecified macular degeneration: Secondary | ICD-10-CM | POA: Diagnosis not present

## 2015-11-25 DIAGNOSIS — I10 Essential (primary) hypertension: Secondary | ICD-10-CM | POA: Diagnosis not present

## 2015-11-25 DIAGNOSIS — R471 Dysarthria and anarthria: Secondary | ICD-10-CM | POA: Diagnosis not present

## 2015-11-25 DIAGNOSIS — E785 Hyperlipidemia, unspecified: Secondary | ICD-10-CM | POA: Diagnosis not present

## 2015-11-25 DIAGNOSIS — R0600 Dyspnea, unspecified: Secondary | ICD-10-CM | POA: Diagnosis not present

## 2015-11-25 DIAGNOSIS — E46 Unspecified protein-calorie malnutrition: Secondary | ICD-10-CM | POA: Diagnosis not present

## 2015-11-25 DIAGNOSIS — N3281 Overactive bladder: Secondary | ICD-10-CM | POA: Diagnosis not present

## 2015-11-25 DIAGNOSIS — G1221 Amyotrophic lateral sclerosis: Secondary | ICD-10-CM | POA: Diagnosis not present

## 2015-11-25 DIAGNOSIS — J309 Allergic rhinitis, unspecified: Secondary | ICD-10-CM | POA: Diagnosis not present

## 2015-11-25 DIAGNOSIS — R1310 Dysphagia, unspecified: Secondary | ICD-10-CM | POA: Diagnosis not present

## 2015-11-25 DIAGNOSIS — C4490 Unspecified malignant neoplasm of skin, unspecified: Secondary | ICD-10-CM | POA: Diagnosis not present

## 2015-11-26 DIAGNOSIS — H3530 Unspecified macular degeneration: Secondary | ICD-10-CM | POA: Diagnosis not present

## 2015-11-26 DIAGNOSIS — M2450 Contracture, unspecified joint: Secondary | ICD-10-CM | POA: Diagnosis not present

## 2015-11-26 DIAGNOSIS — N3281 Overactive bladder: Secondary | ICD-10-CM | POA: Diagnosis not present

## 2015-11-26 DIAGNOSIS — R1310 Dysphagia, unspecified: Secondary | ICD-10-CM | POA: Diagnosis not present

## 2015-11-26 DIAGNOSIS — R0600 Dyspnea, unspecified: Secondary | ICD-10-CM | POA: Diagnosis not present

## 2015-11-26 DIAGNOSIS — J309 Allergic rhinitis, unspecified: Secondary | ICD-10-CM | POA: Diagnosis not present

## 2015-11-26 DIAGNOSIS — G1221 Amyotrophic lateral sclerosis: Secondary | ICD-10-CM | POA: Diagnosis not present

## 2015-11-26 DIAGNOSIS — N952 Postmenopausal atrophic vaginitis: Secondary | ICD-10-CM | POA: Diagnosis not present

## 2015-11-26 DIAGNOSIS — C4490 Unspecified malignant neoplasm of skin, unspecified: Secondary | ICD-10-CM | POA: Diagnosis not present

## 2015-11-26 DIAGNOSIS — E785 Hyperlipidemia, unspecified: Secondary | ICD-10-CM | POA: Diagnosis not present

## 2015-11-26 DIAGNOSIS — E46 Unspecified protein-calorie malnutrition: Secondary | ICD-10-CM | POA: Diagnosis not present

## 2015-11-26 DIAGNOSIS — R471 Dysarthria and anarthria: Secondary | ICD-10-CM | POA: Diagnosis not present

## 2015-11-26 DIAGNOSIS — I10 Essential (primary) hypertension: Secondary | ICD-10-CM | POA: Diagnosis not present

## 2015-11-27 DIAGNOSIS — N952 Postmenopausal atrophic vaginitis: Secondary | ICD-10-CM | POA: Diagnosis not present

## 2015-11-27 DIAGNOSIS — I10 Essential (primary) hypertension: Secondary | ICD-10-CM | POA: Diagnosis not present

## 2015-11-27 DIAGNOSIS — N3281 Overactive bladder: Secondary | ICD-10-CM | POA: Diagnosis not present

## 2015-11-27 DIAGNOSIS — E46 Unspecified protein-calorie malnutrition: Secondary | ICD-10-CM | POA: Diagnosis not present

## 2015-11-27 DIAGNOSIS — J309 Allergic rhinitis, unspecified: Secondary | ICD-10-CM | POA: Diagnosis not present

## 2015-11-27 DIAGNOSIS — C4490 Unspecified malignant neoplasm of skin, unspecified: Secondary | ICD-10-CM | POA: Diagnosis not present

## 2015-11-27 DIAGNOSIS — E785 Hyperlipidemia, unspecified: Secondary | ICD-10-CM | POA: Diagnosis not present

## 2015-11-27 DIAGNOSIS — H3530 Unspecified macular degeneration: Secondary | ICD-10-CM | POA: Diagnosis not present

## 2015-11-27 DIAGNOSIS — G1221 Amyotrophic lateral sclerosis: Secondary | ICD-10-CM | POA: Diagnosis not present

## 2015-11-27 DIAGNOSIS — R471 Dysarthria and anarthria: Secondary | ICD-10-CM | POA: Diagnosis not present

## 2015-11-27 DIAGNOSIS — R0600 Dyspnea, unspecified: Secondary | ICD-10-CM | POA: Diagnosis not present

## 2015-11-27 DIAGNOSIS — R1310 Dysphagia, unspecified: Secondary | ICD-10-CM | POA: Diagnosis not present

## 2015-11-27 DIAGNOSIS — M2450 Contracture, unspecified joint: Secondary | ICD-10-CM | POA: Diagnosis not present

## 2015-11-28 DIAGNOSIS — R471 Dysarthria and anarthria: Secondary | ICD-10-CM | POA: Diagnosis not present

## 2015-11-28 DIAGNOSIS — R1310 Dysphagia, unspecified: Secondary | ICD-10-CM | POA: Diagnosis not present

## 2015-11-28 DIAGNOSIS — E46 Unspecified protein-calorie malnutrition: Secondary | ICD-10-CM | POA: Diagnosis not present

## 2015-11-28 DIAGNOSIS — I10 Essential (primary) hypertension: Secondary | ICD-10-CM | POA: Diagnosis not present

## 2015-11-28 DIAGNOSIS — C4490 Unspecified malignant neoplasm of skin, unspecified: Secondary | ICD-10-CM | POA: Diagnosis not present

## 2015-11-28 DIAGNOSIS — H3530 Unspecified macular degeneration: Secondary | ICD-10-CM | POA: Diagnosis not present

## 2015-11-28 DIAGNOSIS — R0600 Dyspnea, unspecified: Secondary | ICD-10-CM | POA: Diagnosis not present

## 2015-11-28 DIAGNOSIS — E785 Hyperlipidemia, unspecified: Secondary | ICD-10-CM | POA: Diagnosis not present

## 2015-11-28 DIAGNOSIS — J309 Allergic rhinitis, unspecified: Secondary | ICD-10-CM | POA: Diagnosis not present

## 2015-11-28 DIAGNOSIS — M2450 Contracture, unspecified joint: Secondary | ICD-10-CM | POA: Diagnosis not present

## 2015-11-28 DIAGNOSIS — G1221 Amyotrophic lateral sclerosis: Secondary | ICD-10-CM | POA: Diagnosis not present

## 2015-11-28 DIAGNOSIS — N952 Postmenopausal atrophic vaginitis: Secondary | ICD-10-CM | POA: Diagnosis not present

## 2015-11-28 DIAGNOSIS — N3281 Overactive bladder: Secondary | ICD-10-CM | POA: Diagnosis not present

## 2015-11-29 DIAGNOSIS — I10 Essential (primary) hypertension: Secondary | ICD-10-CM | POA: Diagnosis not present

## 2015-11-29 DIAGNOSIS — E785 Hyperlipidemia, unspecified: Secondary | ICD-10-CM | POA: Diagnosis not present

## 2015-11-29 DIAGNOSIS — C4490 Unspecified malignant neoplasm of skin, unspecified: Secondary | ICD-10-CM | POA: Diagnosis not present

## 2015-11-29 DIAGNOSIS — M2450 Contracture, unspecified joint: Secondary | ICD-10-CM | POA: Diagnosis not present

## 2015-11-29 DIAGNOSIS — H3530 Unspecified macular degeneration: Secondary | ICD-10-CM | POA: Diagnosis not present

## 2015-11-29 DIAGNOSIS — J309 Allergic rhinitis, unspecified: Secondary | ICD-10-CM | POA: Diagnosis not present

## 2015-11-29 DIAGNOSIS — N3281 Overactive bladder: Secondary | ICD-10-CM | POA: Diagnosis not present

## 2015-11-29 DIAGNOSIS — E46 Unspecified protein-calorie malnutrition: Secondary | ICD-10-CM | POA: Diagnosis not present

## 2015-11-29 DIAGNOSIS — R0600 Dyspnea, unspecified: Secondary | ICD-10-CM | POA: Diagnosis not present

## 2015-11-29 DIAGNOSIS — N952 Postmenopausal atrophic vaginitis: Secondary | ICD-10-CM | POA: Diagnosis not present

## 2015-11-29 DIAGNOSIS — R471 Dysarthria and anarthria: Secondary | ICD-10-CM | POA: Diagnosis not present

## 2015-11-29 DIAGNOSIS — R1310 Dysphagia, unspecified: Secondary | ICD-10-CM | POA: Diagnosis not present

## 2015-11-29 DIAGNOSIS — G1221 Amyotrophic lateral sclerosis: Secondary | ICD-10-CM | POA: Diagnosis not present

## 2015-11-30 DIAGNOSIS — N3281 Overactive bladder: Secondary | ICD-10-CM | POA: Diagnosis not present

## 2015-11-30 DIAGNOSIS — R471 Dysarthria and anarthria: Secondary | ICD-10-CM | POA: Diagnosis not present

## 2015-11-30 DIAGNOSIS — N952 Postmenopausal atrophic vaginitis: Secondary | ICD-10-CM | POA: Diagnosis not present

## 2015-11-30 DIAGNOSIS — R1310 Dysphagia, unspecified: Secondary | ICD-10-CM | POA: Diagnosis not present

## 2015-11-30 DIAGNOSIS — J309 Allergic rhinitis, unspecified: Secondary | ICD-10-CM | POA: Diagnosis not present

## 2015-11-30 DIAGNOSIS — H3530 Unspecified macular degeneration: Secondary | ICD-10-CM | POA: Diagnosis not present

## 2015-11-30 DIAGNOSIS — E46 Unspecified protein-calorie malnutrition: Secondary | ICD-10-CM | POA: Diagnosis not present

## 2015-11-30 DIAGNOSIS — M2450 Contracture, unspecified joint: Secondary | ICD-10-CM | POA: Diagnosis not present

## 2015-11-30 DIAGNOSIS — G1221 Amyotrophic lateral sclerosis: Secondary | ICD-10-CM | POA: Diagnosis not present

## 2015-11-30 DIAGNOSIS — R0600 Dyspnea, unspecified: Secondary | ICD-10-CM | POA: Diagnosis not present

## 2015-11-30 DIAGNOSIS — E785 Hyperlipidemia, unspecified: Secondary | ICD-10-CM | POA: Diagnosis not present

## 2015-11-30 DIAGNOSIS — I10 Essential (primary) hypertension: Secondary | ICD-10-CM | POA: Diagnosis not present

## 2015-11-30 DIAGNOSIS — C4490 Unspecified malignant neoplasm of skin, unspecified: Secondary | ICD-10-CM | POA: Diagnosis not present

## 2015-12-01 DIAGNOSIS — R1310 Dysphagia, unspecified: Secondary | ICD-10-CM | POA: Diagnosis not present

## 2015-12-01 DIAGNOSIS — C4490 Unspecified malignant neoplasm of skin, unspecified: Secondary | ICD-10-CM | POA: Diagnosis not present

## 2015-12-01 DIAGNOSIS — H3530 Unspecified macular degeneration: Secondary | ICD-10-CM | POA: Diagnosis not present

## 2015-12-01 DIAGNOSIS — N3281 Overactive bladder: Secondary | ICD-10-CM | POA: Diagnosis not present

## 2015-12-01 DIAGNOSIS — R0600 Dyspnea, unspecified: Secondary | ICD-10-CM | POA: Diagnosis not present

## 2015-12-01 DIAGNOSIS — M2450 Contracture, unspecified joint: Secondary | ICD-10-CM | POA: Diagnosis not present

## 2015-12-01 DIAGNOSIS — I10 Essential (primary) hypertension: Secondary | ICD-10-CM | POA: Diagnosis not present

## 2015-12-01 DIAGNOSIS — R471 Dysarthria and anarthria: Secondary | ICD-10-CM | POA: Diagnosis not present

## 2015-12-01 DIAGNOSIS — N952 Postmenopausal atrophic vaginitis: Secondary | ICD-10-CM | POA: Diagnosis not present

## 2015-12-01 DIAGNOSIS — J309 Allergic rhinitis, unspecified: Secondary | ICD-10-CM | POA: Diagnosis not present

## 2015-12-01 DIAGNOSIS — G1221 Amyotrophic lateral sclerosis: Secondary | ICD-10-CM | POA: Diagnosis not present

## 2015-12-01 DIAGNOSIS — E46 Unspecified protein-calorie malnutrition: Secondary | ICD-10-CM | POA: Diagnosis not present

## 2015-12-01 DIAGNOSIS — E785 Hyperlipidemia, unspecified: Secondary | ICD-10-CM | POA: Diagnosis not present

## 2015-12-02 DIAGNOSIS — E46 Unspecified protein-calorie malnutrition: Secondary | ICD-10-CM | POA: Diagnosis not present

## 2015-12-02 DIAGNOSIS — R471 Dysarthria and anarthria: Secondary | ICD-10-CM | POA: Diagnosis not present

## 2015-12-02 DIAGNOSIS — J309 Allergic rhinitis, unspecified: Secondary | ICD-10-CM | POA: Diagnosis not present

## 2015-12-02 DIAGNOSIS — E785 Hyperlipidemia, unspecified: Secondary | ICD-10-CM | POA: Diagnosis not present

## 2015-12-02 DIAGNOSIS — M2450 Contracture, unspecified joint: Secondary | ICD-10-CM | POA: Diagnosis not present

## 2015-12-02 DIAGNOSIS — G1221 Amyotrophic lateral sclerosis: Secondary | ICD-10-CM | POA: Diagnosis not present

## 2015-12-02 DIAGNOSIS — C4490 Unspecified malignant neoplasm of skin, unspecified: Secondary | ICD-10-CM | POA: Diagnosis not present

## 2015-12-02 DIAGNOSIS — N3281 Overactive bladder: Secondary | ICD-10-CM | POA: Diagnosis not present

## 2015-12-02 DIAGNOSIS — H3530 Unspecified macular degeneration: Secondary | ICD-10-CM | POA: Diagnosis not present

## 2015-12-02 DIAGNOSIS — R0600 Dyspnea, unspecified: Secondary | ICD-10-CM | POA: Diagnosis not present

## 2015-12-02 DIAGNOSIS — I10 Essential (primary) hypertension: Secondary | ICD-10-CM | POA: Diagnosis not present

## 2015-12-02 DIAGNOSIS — R1310 Dysphagia, unspecified: Secondary | ICD-10-CM | POA: Diagnosis not present

## 2015-12-02 DIAGNOSIS — N952 Postmenopausal atrophic vaginitis: Secondary | ICD-10-CM | POA: Diagnosis not present

## 2015-12-03 DIAGNOSIS — E785 Hyperlipidemia, unspecified: Secondary | ICD-10-CM | POA: Diagnosis not present

## 2015-12-03 DIAGNOSIS — R1310 Dysphagia, unspecified: Secondary | ICD-10-CM | POA: Diagnosis not present

## 2015-12-03 DIAGNOSIS — J309 Allergic rhinitis, unspecified: Secondary | ICD-10-CM | POA: Diagnosis not present

## 2015-12-03 DIAGNOSIS — N952 Postmenopausal atrophic vaginitis: Secondary | ICD-10-CM | POA: Diagnosis not present

## 2015-12-03 DIAGNOSIS — H3530 Unspecified macular degeneration: Secondary | ICD-10-CM | POA: Diagnosis not present

## 2015-12-03 DIAGNOSIS — R0600 Dyspnea, unspecified: Secondary | ICD-10-CM | POA: Diagnosis not present

## 2015-12-03 DIAGNOSIS — E46 Unspecified protein-calorie malnutrition: Secondary | ICD-10-CM | POA: Diagnosis not present

## 2015-12-03 DIAGNOSIS — C4490 Unspecified malignant neoplasm of skin, unspecified: Secondary | ICD-10-CM | POA: Diagnosis not present

## 2015-12-03 DIAGNOSIS — N3281 Overactive bladder: Secondary | ICD-10-CM | POA: Diagnosis not present

## 2015-12-03 DIAGNOSIS — R471 Dysarthria and anarthria: Secondary | ICD-10-CM | POA: Diagnosis not present

## 2015-12-03 DIAGNOSIS — G1221 Amyotrophic lateral sclerosis: Secondary | ICD-10-CM | POA: Diagnosis not present

## 2015-12-03 DIAGNOSIS — M2450 Contracture, unspecified joint: Secondary | ICD-10-CM | POA: Diagnosis not present

## 2015-12-03 DIAGNOSIS — I10 Essential (primary) hypertension: Secondary | ICD-10-CM | POA: Diagnosis not present

## 2015-12-04 DIAGNOSIS — J309 Allergic rhinitis, unspecified: Secondary | ICD-10-CM | POA: Diagnosis not present

## 2015-12-04 DIAGNOSIS — G1221 Amyotrophic lateral sclerosis: Secondary | ICD-10-CM | POA: Diagnosis not present

## 2015-12-04 DIAGNOSIS — M2450 Contracture, unspecified joint: Secondary | ICD-10-CM | POA: Diagnosis not present

## 2015-12-04 DIAGNOSIS — N3281 Overactive bladder: Secondary | ICD-10-CM | POA: Diagnosis not present

## 2015-12-04 DIAGNOSIS — N952 Postmenopausal atrophic vaginitis: Secondary | ICD-10-CM | POA: Diagnosis not present

## 2015-12-04 DIAGNOSIS — R1310 Dysphagia, unspecified: Secondary | ICD-10-CM | POA: Diagnosis not present

## 2015-12-04 DIAGNOSIS — R471 Dysarthria and anarthria: Secondary | ICD-10-CM | POA: Diagnosis not present

## 2015-12-04 DIAGNOSIS — I10 Essential (primary) hypertension: Secondary | ICD-10-CM | POA: Diagnosis not present

## 2015-12-04 DIAGNOSIS — C4490 Unspecified malignant neoplasm of skin, unspecified: Secondary | ICD-10-CM | POA: Diagnosis not present

## 2015-12-04 DIAGNOSIS — R0600 Dyspnea, unspecified: Secondary | ICD-10-CM | POA: Diagnosis not present

## 2015-12-04 DIAGNOSIS — E46 Unspecified protein-calorie malnutrition: Secondary | ICD-10-CM | POA: Diagnosis not present

## 2015-12-04 DIAGNOSIS — H3530 Unspecified macular degeneration: Secondary | ICD-10-CM | POA: Diagnosis not present

## 2015-12-04 DIAGNOSIS — E785 Hyperlipidemia, unspecified: Secondary | ICD-10-CM | POA: Diagnosis not present

## 2015-12-05 DIAGNOSIS — N3281 Overactive bladder: Secondary | ICD-10-CM | POA: Diagnosis not present

## 2015-12-05 DIAGNOSIS — C4490 Unspecified malignant neoplasm of skin, unspecified: Secondary | ICD-10-CM | POA: Diagnosis not present

## 2015-12-05 DIAGNOSIS — R0600 Dyspnea, unspecified: Secondary | ICD-10-CM | POA: Diagnosis not present

## 2015-12-05 DIAGNOSIS — N952 Postmenopausal atrophic vaginitis: Secondary | ICD-10-CM | POA: Diagnosis not present

## 2015-12-05 DIAGNOSIS — G1221 Amyotrophic lateral sclerosis: Secondary | ICD-10-CM | POA: Diagnosis not present

## 2015-12-05 DIAGNOSIS — H3530 Unspecified macular degeneration: Secondary | ICD-10-CM | POA: Diagnosis not present

## 2015-12-05 DIAGNOSIS — M2450 Contracture, unspecified joint: Secondary | ICD-10-CM | POA: Diagnosis not present

## 2015-12-05 DIAGNOSIS — I10 Essential (primary) hypertension: Secondary | ICD-10-CM | POA: Diagnosis not present

## 2015-12-05 DIAGNOSIS — E46 Unspecified protein-calorie malnutrition: Secondary | ICD-10-CM | POA: Diagnosis not present

## 2015-12-05 DIAGNOSIS — R471 Dysarthria and anarthria: Secondary | ICD-10-CM | POA: Diagnosis not present

## 2015-12-05 DIAGNOSIS — R1310 Dysphagia, unspecified: Secondary | ICD-10-CM | POA: Diagnosis not present

## 2015-12-05 DIAGNOSIS — J309 Allergic rhinitis, unspecified: Secondary | ICD-10-CM | POA: Diagnosis not present

## 2015-12-05 DIAGNOSIS — E785 Hyperlipidemia, unspecified: Secondary | ICD-10-CM | POA: Diagnosis not present

## 2015-12-06 DIAGNOSIS — R0600 Dyspnea, unspecified: Secondary | ICD-10-CM | POA: Diagnosis not present

## 2015-12-06 DIAGNOSIS — E46 Unspecified protein-calorie malnutrition: Secondary | ICD-10-CM | POA: Diagnosis not present

## 2015-12-06 DIAGNOSIS — G1221 Amyotrophic lateral sclerosis: Secondary | ICD-10-CM | POA: Diagnosis not present

## 2015-12-06 DIAGNOSIS — R1310 Dysphagia, unspecified: Secondary | ICD-10-CM | POA: Diagnosis not present

## 2015-12-06 DIAGNOSIS — C4490 Unspecified malignant neoplasm of skin, unspecified: Secondary | ICD-10-CM | POA: Diagnosis not present

## 2015-12-06 DIAGNOSIS — H3530 Unspecified macular degeneration: Secondary | ICD-10-CM | POA: Diagnosis not present

## 2015-12-06 DIAGNOSIS — R471 Dysarthria and anarthria: Secondary | ICD-10-CM | POA: Diagnosis not present

## 2015-12-06 DIAGNOSIS — E785 Hyperlipidemia, unspecified: Secondary | ICD-10-CM | POA: Diagnosis not present

## 2015-12-06 DIAGNOSIS — N952 Postmenopausal atrophic vaginitis: Secondary | ICD-10-CM | POA: Diagnosis not present

## 2015-12-06 DIAGNOSIS — N3281 Overactive bladder: Secondary | ICD-10-CM | POA: Diagnosis not present

## 2015-12-06 DIAGNOSIS — M2450 Contracture, unspecified joint: Secondary | ICD-10-CM | POA: Diagnosis not present

## 2015-12-06 DIAGNOSIS — I10 Essential (primary) hypertension: Secondary | ICD-10-CM | POA: Diagnosis not present

## 2015-12-06 DIAGNOSIS — J309 Allergic rhinitis, unspecified: Secondary | ICD-10-CM | POA: Diagnosis not present

## 2015-12-07 DIAGNOSIS — R471 Dysarthria and anarthria: Secondary | ICD-10-CM | POA: Diagnosis not present

## 2015-12-07 DIAGNOSIS — I10 Essential (primary) hypertension: Secondary | ICD-10-CM | POA: Diagnosis not present

## 2015-12-07 DIAGNOSIS — R0600 Dyspnea, unspecified: Secondary | ICD-10-CM | POA: Diagnosis not present

## 2015-12-07 DIAGNOSIS — H3530 Unspecified macular degeneration: Secondary | ICD-10-CM | POA: Diagnosis not present

## 2015-12-07 DIAGNOSIS — M2450 Contracture, unspecified joint: Secondary | ICD-10-CM | POA: Diagnosis not present

## 2015-12-07 DIAGNOSIS — N952 Postmenopausal atrophic vaginitis: Secondary | ICD-10-CM | POA: Diagnosis not present

## 2015-12-07 DIAGNOSIS — E46 Unspecified protein-calorie malnutrition: Secondary | ICD-10-CM | POA: Diagnosis not present

## 2015-12-07 DIAGNOSIS — J309 Allergic rhinitis, unspecified: Secondary | ICD-10-CM | POA: Diagnosis not present

## 2015-12-07 DIAGNOSIS — E785 Hyperlipidemia, unspecified: Secondary | ICD-10-CM | POA: Diagnosis not present

## 2015-12-07 DIAGNOSIS — C4490 Unspecified malignant neoplasm of skin, unspecified: Secondary | ICD-10-CM | POA: Diagnosis not present

## 2015-12-07 DIAGNOSIS — G1221 Amyotrophic lateral sclerosis: Secondary | ICD-10-CM | POA: Diagnosis not present

## 2015-12-07 DIAGNOSIS — N3281 Overactive bladder: Secondary | ICD-10-CM | POA: Diagnosis not present

## 2015-12-07 DIAGNOSIS — R1310 Dysphagia, unspecified: Secondary | ICD-10-CM | POA: Diagnosis not present

## 2015-12-08 DIAGNOSIS — R471 Dysarthria and anarthria: Secondary | ICD-10-CM | POA: Diagnosis not present

## 2015-12-08 DIAGNOSIS — R1310 Dysphagia, unspecified: Secondary | ICD-10-CM | POA: Diagnosis not present

## 2015-12-08 DIAGNOSIS — J309 Allergic rhinitis, unspecified: Secondary | ICD-10-CM | POA: Diagnosis not present

## 2015-12-08 DIAGNOSIS — E46 Unspecified protein-calorie malnutrition: Secondary | ICD-10-CM | POA: Diagnosis not present

## 2015-12-08 DIAGNOSIS — M2450 Contracture, unspecified joint: Secondary | ICD-10-CM | POA: Diagnosis not present

## 2015-12-08 DIAGNOSIS — C4490 Unspecified malignant neoplasm of skin, unspecified: Secondary | ICD-10-CM | POA: Diagnosis not present

## 2015-12-08 DIAGNOSIS — R0600 Dyspnea, unspecified: Secondary | ICD-10-CM | POA: Diagnosis not present

## 2015-12-08 DIAGNOSIS — N952 Postmenopausal atrophic vaginitis: Secondary | ICD-10-CM | POA: Diagnosis not present

## 2015-12-08 DIAGNOSIS — G1221 Amyotrophic lateral sclerosis: Secondary | ICD-10-CM | POA: Diagnosis not present

## 2015-12-08 DIAGNOSIS — N3281 Overactive bladder: Secondary | ICD-10-CM | POA: Diagnosis not present

## 2015-12-08 DIAGNOSIS — I10 Essential (primary) hypertension: Secondary | ICD-10-CM | POA: Diagnosis not present

## 2015-12-08 DIAGNOSIS — E785 Hyperlipidemia, unspecified: Secondary | ICD-10-CM | POA: Diagnosis not present

## 2015-12-08 DIAGNOSIS — H3530 Unspecified macular degeneration: Secondary | ICD-10-CM | POA: Diagnosis not present

## 2015-12-09 DIAGNOSIS — R471 Dysarthria and anarthria: Secondary | ICD-10-CM | POA: Diagnosis not present

## 2015-12-09 DIAGNOSIS — E785 Hyperlipidemia, unspecified: Secondary | ICD-10-CM | POA: Diagnosis not present

## 2015-12-09 DIAGNOSIS — N952 Postmenopausal atrophic vaginitis: Secondary | ICD-10-CM | POA: Diagnosis not present

## 2015-12-09 DIAGNOSIS — J309 Allergic rhinitis, unspecified: Secondary | ICD-10-CM | POA: Diagnosis not present

## 2015-12-09 DIAGNOSIS — H3530 Unspecified macular degeneration: Secondary | ICD-10-CM | POA: Diagnosis not present

## 2015-12-09 DIAGNOSIS — R0600 Dyspnea, unspecified: Secondary | ICD-10-CM | POA: Diagnosis not present

## 2015-12-09 DIAGNOSIS — E46 Unspecified protein-calorie malnutrition: Secondary | ICD-10-CM | POA: Diagnosis not present

## 2015-12-09 DIAGNOSIS — G1221 Amyotrophic lateral sclerosis: Secondary | ICD-10-CM | POA: Diagnosis not present

## 2015-12-09 DIAGNOSIS — I10 Essential (primary) hypertension: Secondary | ICD-10-CM | POA: Diagnosis not present

## 2015-12-09 DIAGNOSIS — R1310 Dysphagia, unspecified: Secondary | ICD-10-CM | POA: Diagnosis not present

## 2015-12-09 DIAGNOSIS — N3281 Overactive bladder: Secondary | ICD-10-CM | POA: Diagnosis not present

## 2015-12-09 DIAGNOSIS — M2450 Contracture, unspecified joint: Secondary | ICD-10-CM | POA: Diagnosis not present

## 2015-12-09 DIAGNOSIS — C4490 Unspecified malignant neoplasm of skin, unspecified: Secondary | ICD-10-CM | POA: Diagnosis not present

## 2015-12-10 ENCOUNTER — Other Ambulatory Visit: Payer: Self-pay | Admitting: *Deleted

## 2015-12-10 DIAGNOSIS — E46 Unspecified protein-calorie malnutrition: Secondary | ICD-10-CM | POA: Diagnosis not present

## 2015-12-10 DIAGNOSIS — N952 Postmenopausal atrophic vaginitis: Secondary | ICD-10-CM | POA: Diagnosis not present

## 2015-12-10 DIAGNOSIS — H3530 Unspecified macular degeneration: Secondary | ICD-10-CM | POA: Diagnosis not present

## 2015-12-10 DIAGNOSIS — G1221 Amyotrophic lateral sclerosis: Secondary | ICD-10-CM | POA: Diagnosis not present

## 2015-12-10 DIAGNOSIS — J309 Allergic rhinitis, unspecified: Secondary | ICD-10-CM | POA: Diagnosis not present

## 2015-12-10 DIAGNOSIS — R471 Dysarthria and anarthria: Secondary | ICD-10-CM | POA: Diagnosis not present

## 2015-12-10 DIAGNOSIS — E785 Hyperlipidemia, unspecified: Secondary | ICD-10-CM | POA: Diagnosis not present

## 2015-12-10 DIAGNOSIS — R0600 Dyspnea, unspecified: Secondary | ICD-10-CM | POA: Diagnosis not present

## 2015-12-10 DIAGNOSIS — N3281 Overactive bladder: Secondary | ICD-10-CM | POA: Diagnosis not present

## 2015-12-10 DIAGNOSIS — M2450 Contracture, unspecified joint: Secondary | ICD-10-CM | POA: Diagnosis not present

## 2015-12-10 DIAGNOSIS — I10 Essential (primary) hypertension: Secondary | ICD-10-CM | POA: Diagnosis not present

## 2015-12-10 DIAGNOSIS — C4490 Unspecified malignant neoplasm of skin, unspecified: Secondary | ICD-10-CM | POA: Diagnosis not present

## 2015-12-10 DIAGNOSIS — R1310 Dysphagia, unspecified: Secondary | ICD-10-CM | POA: Diagnosis not present

## 2015-12-10 MED ORDER — LORAZEPAM 0.5 MG PO TABS: ORAL_TABLET | ORAL | Status: DC

## 2015-12-11 DIAGNOSIS — H3530 Unspecified macular degeneration: Secondary | ICD-10-CM | POA: Diagnosis not present

## 2015-12-11 DIAGNOSIS — R1310 Dysphagia, unspecified: Secondary | ICD-10-CM | POA: Diagnosis not present

## 2015-12-11 DIAGNOSIS — J309 Allergic rhinitis, unspecified: Secondary | ICD-10-CM | POA: Diagnosis not present

## 2015-12-11 DIAGNOSIS — I10 Essential (primary) hypertension: Secondary | ICD-10-CM | POA: Diagnosis not present

## 2015-12-11 DIAGNOSIS — N952 Postmenopausal atrophic vaginitis: Secondary | ICD-10-CM | POA: Diagnosis not present

## 2015-12-11 DIAGNOSIS — N3281 Overactive bladder: Secondary | ICD-10-CM | POA: Diagnosis not present

## 2015-12-11 DIAGNOSIS — C4490 Unspecified malignant neoplasm of skin, unspecified: Secondary | ICD-10-CM | POA: Diagnosis not present

## 2015-12-11 DIAGNOSIS — E46 Unspecified protein-calorie malnutrition: Secondary | ICD-10-CM | POA: Diagnosis not present

## 2015-12-11 DIAGNOSIS — G1221 Amyotrophic lateral sclerosis: Secondary | ICD-10-CM | POA: Diagnosis not present

## 2015-12-11 DIAGNOSIS — R471 Dysarthria and anarthria: Secondary | ICD-10-CM | POA: Diagnosis not present

## 2015-12-11 DIAGNOSIS — E785 Hyperlipidemia, unspecified: Secondary | ICD-10-CM | POA: Diagnosis not present

## 2015-12-11 DIAGNOSIS — R0600 Dyspnea, unspecified: Secondary | ICD-10-CM | POA: Diagnosis not present

## 2015-12-11 DIAGNOSIS — M2450 Contracture, unspecified joint: Secondary | ICD-10-CM | POA: Diagnosis not present

## 2015-12-12 DIAGNOSIS — M2450 Contracture, unspecified joint: Secondary | ICD-10-CM | POA: Diagnosis not present

## 2015-12-12 DIAGNOSIS — E785 Hyperlipidemia, unspecified: Secondary | ICD-10-CM | POA: Diagnosis not present

## 2015-12-12 DIAGNOSIS — H3530 Unspecified macular degeneration: Secondary | ICD-10-CM | POA: Diagnosis not present

## 2015-12-12 DIAGNOSIS — I10 Essential (primary) hypertension: Secondary | ICD-10-CM | POA: Diagnosis not present

## 2015-12-12 DIAGNOSIS — R1310 Dysphagia, unspecified: Secondary | ICD-10-CM | POA: Diagnosis not present

## 2015-12-12 DIAGNOSIS — N3281 Overactive bladder: Secondary | ICD-10-CM | POA: Diagnosis not present

## 2015-12-12 DIAGNOSIS — N952 Postmenopausal atrophic vaginitis: Secondary | ICD-10-CM | POA: Diagnosis not present

## 2015-12-12 DIAGNOSIS — G1221 Amyotrophic lateral sclerosis: Secondary | ICD-10-CM | POA: Diagnosis not present

## 2015-12-12 DIAGNOSIS — E46 Unspecified protein-calorie malnutrition: Secondary | ICD-10-CM | POA: Diagnosis not present

## 2015-12-12 DIAGNOSIS — R471 Dysarthria and anarthria: Secondary | ICD-10-CM | POA: Diagnosis not present

## 2015-12-12 DIAGNOSIS — C4490 Unspecified malignant neoplasm of skin, unspecified: Secondary | ICD-10-CM | POA: Diagnosis not present

## 2015-12-12 DIAGNOSIS — J309 Allergic rhinitis, unspecified: Secondary | ICD-10-CM | POA: Diagnosis not present

## 2015-12-12 DIAGNOSIS — R0600 Dyspnea, unspecified: Secondary | ICD-10-CM | POA: Diagnosis not present

## 2015-12-13 DIAGNOSIS — N952 Postmenopausal atrophic vaginitis: Secondary | ICD-10-CM | POA: Diagnosis not present

## 2015-12-13 DIAGNOSIS — N3281 Overactive bladder: Secondary | ICD-10-CM | POA: Diagnosis not present

## 2015-12-13 DIAGNOSIS — G1221 Amyotrophic lateral sclerosis: Secondary | ICD-10-CM | POA: Diagnosis not present

## 2015-12-13 DIAGNOSIS — M2450 Contracture, unspecified joint: Secondary | ICD-10-CM | POA: Diagnosis not present

## 2015-12-13 DIAGNOSIS — J309 Allergic rhinitis, unspecified: Secondary | ICD-10-CM | POA: Diagnosis not present

## 2015-12-13 DIAGNOSIS — I10 Essential (primary) hypertension: Secondary | ICD-10-CM | POA: Diagnosis not present

## 2015-12-13 DIAGNOSIS — R0600 Dyspnea, unspecified: Secondary | ICD-10-CM | POA: Diagnosis not present

## 2015-12-13 DIAGNOSIS — R1310 Dysphagia, unspecified: Secondary | ICD-10-CM | POA: Diagnosis not present

## 2015-12-13 DIAGNOSIS — H3530 Unspecified macular degeneration: Secondary | ICD-10-CM | POA: Diagnosis not present

## 2015-12-13 DIAGNOSIS — R471 Dysarthria and anarthria: Secondary | ICD-10-CM | POA: Diagnosis not present

## 2015-12-13 DIAGNOSIS — E46 Unspecified protein-calorie malnutrition: Secondary | ICD-10-CM | POA: Diagnosis not present

## 2015-12-13 DIAGNOSIS — C4490 Unspecified malignant neoplasm of skin, unspecified: Secondary | ICD-10-CM | POA: Diagnosis not present

## 2015-12-13 DIAGNOSIS — E785 Hyperlipidemia, unspecified: Secondary | ICD-10-CM | POA: Diagnosis not present

## 2015-12-14 DIAGNOSIS — G1221 Amyotrophic lateral sclerosis: Secondary | ICD-10-CM | POA: Diagnosis not present

## 2015-12-14 DIAGNOSIS — N952 Postmenopausal atrophic vaginitis: Secondary | ICD-10-CM | POA: Diagnosis not present

## 2015-12-14 DIAGNOSIS — R0600 Dyspnea, unspecified: Secondary | ICD-10-CM | POA: Diagnosis not present

## 2015-12-14 DIAGNOSIS — I10 Essential (primary) hypertension: Secondary | ICD-10-CM | POA: Diagnosis not present

## 2015-12-14 DIAGNOSIS — E46 Unspecified protein-calorie malnutrition: Secondary | ICD-10-CM | POA: Diagnosis not present

## 2015-12-14 DIAGNOSIS — R471 Dysarthria and anarthria: Secondary | ICD-10-CM | POA: Diagnosis not present

## 2015-12-14 DIAGNOSIS — J309 Allergic rhinitis, unspecified: Secondary | ICD-10-CM | POA: Diagnosis not present

## 2015-12-14 DIAGNOSIS — R1310 Dysphagia, unspecified: Secondary | ICD-10-CM | POA: Diagnosis not present

## 2015-12-14 DIAGNOSIS — C4490 Unspecified malignant neoplasm of skin, unspecified: Secondary | ICD-10-CM | POA: Diagnosis not present

## 2015-12-14 DIAGNOSIS — H3530 Unspecified macular degeneration: Secondary | ICD-10-CM | POA: Diagnosis not present

## 2015-12-14 DIAGNOSIS — M2450 Contracture, unspecified joint: Secondary | ICD-10-CM | POA: Diagnosis not present

## 2015-12-14 DIAGNOSIS — E785 Hyperlipidemia, unspecified: Secondary | ICD-10-CM | POA: Diagnosis not present

## 2015-12-14 DIAGNOSIS — N3281 Overactive bladder: Secondary | ICD-10-CM | POA: Diagnosis not present

## 2015-12-15 DIAGNOSIS — N952 Postmenopausal atrophic vaginitis: Secondary | ICD-10-CM | POA: Diagnosis not present

## 2015-12-15 DIAGNOSIS — H3530 Unspecified macular degeneration: Secondary | ICD-10-CM | POA: Diagnosis not present

## 2015-12-15 DIAGNOSIS — E46 Unspecified protein-calorie malnutrition: Secondary | ICD-10-CM | POA: Diagnosis not present

## 2015-12-15 DIAGNOSIS — R471 Dysarthria and anarthria: Secondary | ICD-10-CM | POA: Diagnosis not present

## 2015-12-15 DIAGNOSIS — N3281 Overactive bladder: Secondary | ICD-10-CM | POA: Diagnosis not present

## 2015-12-15 DIAGNOSIS — R0600 Dyspnea, unspecified: Secondary | ICD-10-CM | POA: Diagnosis not present

## 2015-12-15 DIAGNOSIS — G1221 Amyotrophic lateral sclerosis: Secondary | ICD-10-CM | POA: Diagnosis not present

## 2015-12-15 DIAGNOSIS — M2450 Contracture, unspecified joint: Secondary | ICD-10-CM | POA: Diagnosis not present

## 2015-12-15 DIAGNOSIS — E785 Hyperlipidemia, unspecified: Secondary | ICD-10-CM | POA: Diagnosis not present

## 2015-12-15 DIAGNOSIS — J309 Allergic rhinitis, unspecified: Secondary | ICD-10-CM | POA: Diagnosis not present

## 2015-12-15 DIAGNOSIS — C4490 Unspecified malignant neoplasm of skin, unspecified: Secondary | ICD-10-CM | POA: Diagnosis not present

## 2015-12-15 DIAGNOSIS — I10 Essential (primary) hypertension: Secondary | ICD-10-CM | POA: Diagnosis not present

## 2015-12-15 DIAGNOSIS — R1310 Dysphagia, unspecified: Secondary | ICD-10-CM | POA: Diagnosis not present

## 2015-12-16 DIAGNOSIS — R0600 Dyspnea, unspecified: Secondary | ICD-10-CM | POA: Diagnosis not present

## 2015-12-16 DIAGNOSIS — H3530 Unspecified macular degeneration: Secondary | ICD-10-CM | POA: Diagnosis not present

## 2015-12-16 DIAGNOSIS — I10 Essential (primary) hypertension: Secondary | ICD-10-CM | POA: Diagnosis not present

## 2015-12-16 DIAGNOSIS — C4490 Unspecified malignant neoplasm of skin, unspecified: Secondary | ICD-10-CM | POA: Diagnosis not present

## 2015-12-16 DIAGNOSIS — R471 Dysarthria and anarthria: Secondary | ICD-10-CM | POA: Diagnosis not present

## 2015-12-16 DIAGNOSIS — N3281 Overactive bladder: Secondary | ICD-10-CM | POA: Diagnosis not present

## 2015-12-16 DIAGNOSIS — E785 Hyperlipidemia, unspecified: Secondary | ICD-10-CM | POA: Diagnosis not present

## 2015-12-16 DIAGNOSIS — E46 Unspecified protein-calorie malnutrition: Secondary | ICD-10-CM | POA: Diagnosis not present

## 2015-12-16 DIAGNOSIS — J309 Allergic rhinitis, unspecified: Secondary | ICD-10-CM | POA: Diagnosis not present

## 2015-12-16 DIAGNOSIS — R1310 Dysphagia, unspecified: Secondary | ICD-10-CM | POA: Diagnosis not present

## 2015-12-16 DIAGNOSIS — M2450 Contracture, unspecified joint: Secondary | ICD-10-CM | POA: Diagnosis not present

## 2015-12-16 DIAGNOSIS — N952 Postmenopausal atrophic vaginitis: Secondary | ICD-10-CM | POA: Diagnosis not present

## 2015-12-16 DIAGNOSIS — G1221 Amyotrophic lateral sclerosis: Secondary | ICD-10-CM | POA: Diagnosis not present

## 2015-12-17 DIAGNOSIS — R0600 Dyspnea, unspecified: Secondary | ICD-10-CM | POA: Diagnosis not present

## 2015-12-17 DIAGNOSIS — R1310 Dysphagia, unspecified: Secondary | ICD-10-CM | POA: Diagnosis not present

## 2015-12-17 DIAGNOSIS — E785 Hyperlipidemia, unspecified: Secondary | ICD-10-CM | POA: Diagnosis not present

## 2015-12-17 DIAGNOSIS — N952 Postmenopausal atrophic vaginitis: Secondary | ICD-10-CM | POA: Diagnosis not present

## 2015-12-17 DIAGNOSIS — M2450 Contracture, unspecified joint: Secondary | ICD-10-CM | POA: Diagnosis not present

## 2015-12-17 DIAGNOSIS — I10 Essential (primary) hypertension: Secondary | ICD-10-CM | POA: Diagnosis not present

## 2015-12-17 DIAGNOSIS — G1221 Amyotrophic lateral sclerosis: Secondary | ICD-10-CM | POA: Diagnosis not present

## 2015-12-17 DIAGNOSIS — N3281 Overactive bladder: Secondary | ICD-10-CM | POA: Diagnosis not present

## 2015-12-17 DIAGNOSIS — J309 Allergic rhinitis, unspecified: Secondary | ICD-10-CM | POA: Diagnosis not present

## 2015-12-17 DIAGNOSIS — E46 Unspecified protein-calorie malnutrition: Secondary | ICD-10-CM | POA: Diagnosis not present

## 2015-12-17 DIAGNOSIS — C4490 Unspecified malignant neoplasm of skin, unspecified: Secondary | ICD-10-CM | POA: Diagnosis not present

## 2015-12-17 DIAGNOSIS — R471 Dysarthria and anarthria: Secondary | ICD-10-CM | POA: Diagnosis not present

## 2015-12-17 DIAGNOSIS — H3530 Unspecified macular degeneration: Secondary | ICD-10-CM | POA: Diagnosis not present

## 2015-12-18 DIAGNOSIS — M2450 Contracture, unspecified joint: Secondary | ICD-10-CM | POA: Diagnosis not present

## 2015-12-18 DIAGNOSIS — R1310 Dysphagia, unspecified: Secondary | ICD-10-CM | POA: Diagnosis not present

## 2015-12-18 DIAGNOSIS — R0600 Dyspnea, unspecified: Secondary | ICD-10-CM | POA: Diagnosis not present

## 2015-12-18 DIAGNOSIS — G1221 Amyotrophic lateral sclerosis: Secondary | ICD-10-CM | POA: Diagnosis not present

## 2015-12-18 DIAGNOSIS — R471 Dysarthria and anarthria: Secondary | ICD-10-CM | POA: Diagnosis not present

## 2015-12-18 DIAGNOSIS — N3281 Overactive bladder: Secondary | ICD-10-CM | POA: Diagnosis not present

## 2015-12-18 DIAGNOSIS — E46 Unspecified protein-calorie malnutrition: Secondary | ICD-10-CM | POA: Diagnosis not present

## 2015-12-18 DIAGNOSIS — C4490 Unspecified malignant neoplasm of skin, unspecified: Secondary | ICD-10-CM | POA: Diagnosis not present

## 2015-12-18 DIAGNOSIS — E785 Hyperlipidemia, unspecified: Secondary | ICD-10-CM | POA: Diagnosis not present

## 2015-12-18 DIAGNOSIS — N952 Postmenopausal atrophic vaginitis: Secondary | ICD-10-CM | POA: Diagnosis not present

## 2015-12-18 DIAGNOSIS — I10 Essential (primary) hypertension: Secondary | ICD-10-CM | POA: Diagnosis not present

## 2015-12-18 DIAGNOSIS — H3530 Unspecified macular degeneration: Secondary | ICD-10-CM | POA: Diagnosis not present

## 2015-12-18 DIAGNOSIS — J309 Allergic rhinitis, unspecified: Secondary | ICD-10-CM | POA: Diagnosis not present

## 2015-12-19 DIAGNOSIS — C4490 Unspecified malignant neoplasm of skin, unspecified: Secondary | ICD-10-CM | POA: Diagnosis not present

## 2015-12-19 DIAGNOSIS — G1221 Amyotrophic lateral sclerosis: Secondary | ICD-10-CM | POA: Diagnosis not present

## 2015-12-19 DIAGNOSIS — R471 Dysarthria and anarthria: Secondary | ICD-10-CM | POA: Diagnosis not present

## 2015-12-19 DIAGNOSIS — E46 Unspecified protein-calorie malnutrition: Secondary | ICD-10-CM | POA: Diagnosis not present

## 2015-12-19 DIAGNOSIS — E785 Hyperlipidemia, unspecified: Secondary | ICD-10-CM | POA: Diagnosis not present

## 2015-12-19 DIAGNOSIS — R0600 Dyspnea, unspecified: Secondary | ICD-10-CM | POA: Diagnosis not present

## 2015-12-19 DIAGNOSIS — I10 Essential (primary) hypertension: Secondary | ICD-10-CM | POA: Diagnosis not present

## 2015-12-19 DIAGNOSIS — J309 Allergic rhinitis, unspecified: Secondary | ICD-10-CM | POA: Diagnosis not present

## 2015-12-19 DIAGNOSIS — N3281 Overactive bladder: Secondary | ICD-10-CM | POA: Diagnosis not present

## 2015-12-19 DIAGNOSIS — R1310 Dysphagia, unspecified: Secondary | ICD-10-CM | POA: Diagnosis not present

## 2015-12-19 DIAGNOSIS — N952 Postmenopausal atrophic vaginitis: Secondary | ICD-10-CM | POA: Diagnosis not present

## 2015-12-19 DIAGNOSIS — H3530 Unspecified macular degeneration: Secondary | ICD-10-CM | POA: Diagnosis not present

## 2015-12-19 DIAGNOSIS — M2450 Contracture, unspecified joint: Secondary | ICD-10-CM | POA: Diagnosis not present

## 2015-12-20 DIAGNOSIS — N952 Postmenopausal atrophic vaginitis: Secondary | ICD-10-CM | POA: Diagnosis not present

## 2015-12-20 DIAGNOSIS — H3530 Unspecified macular degeneration: Secondary | ICD-10-CM | POA: Diagnosis not present

## 2015-12-20 DIAGNOSIS — M2450 Contracture, unspecified joint: Secondary | ICD-10-CM | POA: Diagnosis not present

## 2015-12-20 DIAGNOSIS — R471 Dysarthria and anarthria: Secondary | ICD-10-CM | POA: Diagnosis not present

## 2015-12-20 DIAGNOSIS — N3281 Overactive bladder: Secondary | ICD-10-CM | POA: Diagnosis not present

## 2015-12-20 DIAGNOSIS — J309 Allergic rhinitis, unspecified: Secondary | ICD-10-CM | POA: Diagnosis not present

## 2015-12-20 DIAGNOSIS — R1310 Dysphagia, unspecified: Secondary | ICD-10-CM | POA: Diagnosis not present

## 2015-12-20 DIAGNOSIS — C4490 Unspecified malignant neoplasm of skin, unspecified: Secondary | ICD-10-CM | POA: Diagnosis not present

## 2015-12-20 DIAGNOSIS — I10 Essential (primary) hypertension: Secondary | ICD-10-CM | POA: Diagnosis not present

## 2015-12-20 DIAGNOSIS — E785 Hyperlipidemia, unspecified: Secondary | ICD-10-CM | POA: Diagnosis not present

## 2015-12-20 DIAGNOSIS — G1221 Amyotrophic lateral sclerosis: Secondary | ICD-10-CM | POA: Diagnosis not present

## 2015-12-20 DIAGNOSIS — R0600 Dyspnea, unspecified: Secondary | ICD-10-CM | POA: Diagnosis not present

## 2015-12-20 DIAGNOSIS — E46 Unspecified protein-calorie malnutrition: Secondary | ICD-10-CM | POA: Diagnosis not present

## 2015-12-21 DIAGNOSIS — H3530 Unspecified macular degeneration: Secondary | ICD-10-CM | POA: Diagnosis not present

## 2015-12-21 DIAGNOSIS — N3281 Overactive bladder: Secondary | ICD-10-CM | POA: Diagnosis not present

## 2015-12-21 DIAGNOSIS — R471 Dysarthria and anarthria: Secondary | ICD-10-CM | POA: Diagnosis not present

## 2015-12-21 DIAGNOSIS — M2450 Contracture, unspecified joint: Secondary | ICD-10-CM | POA: Diagnosis not present

## 2015-12-21 DIAGNOSIS — R0600 Dyspnea, unspecified: Secondary | ICD-10-CM | POA: Diagnosis not present

## 2015-12-21 DIAGNOSIS — E785 Hyperlipidemia, unspecified: Secondary | ICD-10-CM | POA: Diagnosis not present

## 2015-12-21 DIAGNOSIS — R1310 Dysphagia, unspecified: Secondary | ICD-10-CM | POA: Diagnosis not present

## 2015-12-21 DIAGNOSIS — G1221 Amyotrophic lateral sclerosis: Secondary | ICD-10-CM | POA: Diagnosis not present

## 2015-12-21 DIAGNOSIS — J309 Allergic rhinitis, unspecified: Secondary | ICD-10-CM | POA: Diagnosis not present

## 2015-12-21 DIAGNOSIS — N952 Postmenopausal atrophic vaginitis: Secondary | ICD-10-CM | POA: Diagnosis not present

## 2015-12-21 DIAGNOSIS — I10 Essential (primary) hypertension: Secondary | ICD-10-CM | POA: Diagnosis not present

## 2015-12-21 DIAGNOSIS — C4490 Unspecified malignant neoplasm of skin, unspecified: Secondary | ICD-10-CM | POA: Diagnosis not present

## 2015-12-21 DIAGNOSIS — E46 Unspecified protein-calorie malnutrition: Secondary | ICD-10-CM | POA: Diagnosis not present

## 2015-12-22 DIAGNOSIS — R0600 Dyspnea, unspecified: Secondary | ICD-10-CM | POA: Diagnosis not present

## 2015-12-22 DIAGNOSIS — R471 Dysarthria and anarthria: Secondary | ICD-10-CM | POA: Diagnosis not present

## 2015-12-22 DIAGNOSIS — M2450 Contracture, unspecified joint: Secondary | ICD-10-CM | POA: Diagnosis not present

## 2015-12-22 DIAGNOSIS — G1221 Amyotrophic lateral sclerosis: Secondary | ICD-10-CM | POA: Diagnosis not present

## 2015-12-22 DIAGNOSIS — H3530 Unspecified macular degeneration: Secondary | ICD-10-CM | POA: Diagnosis not present

## 2015-12-22 DIAGNOSIS — N3281 Overactive bladder: Secondary | ICD-10-CM | POA: Diagnosis not present

## 2015-12-22 DIAGNOSIS — E46 Unspecified protein-calorie malnutrition: Secondary | ICD-10-CM | POA: Diagnosis not present

## 2015-12-22 DIAGNOSIS — N952 Postmenopausal atrophic vaginitis: Secondary | ICD-10-CM | POA: Diagnosis not present

## 2015-12-22 DIAGNOSIS — R1310 Dysphagia, unspecified: Secondary | ICD-10-CM | POA: Diagnosis not present

## 2015-12-22 DIAGNOSIS — C4490 Unspecified malignant neoplasm of skin, unspecified: Secondary | ICD-10-CM | POA: Diagnosis not present

## 2015-12-22 DIAGNOSIS — I10 Essential (primary) hypertension: Secondary | ICD-10-CM | POA: Diagnosis not present

## 2015-12-22 DIAGNOSIS — E785 Hyperlipidemia, unspecified: Secondary | ICD-10-CM | POA: Diagnosis not present

## 2015-12-22 DIAGNOSIS — J309 Allergic rhinitis, unspecified: Secondary | ICD-10-CM | POA: Diagnosis not present

## 2015-12-23 ENCOUNTER — Non-Acute Institutional Stay (SKILLED_NURSING_FACILITY): Payer: Medicare Other | Admitting: Adult Health

## 2015-12-23 ENCOUNTER — Encounter: Payer: Self-pay | Admitting: Adult Health

## 2015-12-23 DIAGNOSIS — G1221 Amyotrophic lateral sclerosis: Secondary | ICD-10-CM

## 2015-12-23 DIAGNOSIS — S82291S Other fracture of shaft of right tibia, sequela: Secondary | ICD-10-CM | POA: Diagnosis not present

## 2015-12-23 DIAGNOSIS — C4490 Unspecified malignant neoplasm of skin, unspecified: Secondary | ICD-10-CM | POA: Diagnosis not present

## 2015-12-23 DIAGNOSIS — J309 Allergic rhinitis, unspecified: Secondary | ICD-10-CM | POA: Diagnosis not present

## 2015-12-23 DIAGNOSIS — G629 Polyneuropathy, unspecified: Secondary | ICD-10-CM | POA: Diagnosis not present

## 2015-12-23 DIAGNOSIS — E538 Deficiency of other specified B group vitamins: Secondary | ICD-10-CM

## 2015-12-23 DIAGNOSIS — I10 Essential (primary) hypertension: Secondary | ICD-10-CM | POA: Diagnosis not present

## 2015-12-23 DIAGNOSIS — F419 Anxiety disorder, unspecified: Secondary | ICD-10-CM

## 2015-12-23 DIAGNOSIS — K5900 Constipation, unspecified: Secondary | ICD-10-CM

## 2015-12-23 DIAGNOSIS — E785 Hyperlipidemia, unspecified: Secondary | ICD-10-CM | POA: Diagnosis not present

## 2015-12-23 DIAGNOSIS — N3281 Overactive bladder: Secondary | ICD-10-CM | POA: Diagnosis not present

## 2015-12-23 DIAGNOSIS — M2450 Contracture, unspecified joint: Secondary | ICD-10-CM | POA: Diagnosis not present

## 2015-12-23 DIAGNOSIS — R0600 Dyspnea, unspecified: Secondary | ICD-10-CM | POA: Diagnosis not present

## 2015-12-23 DIAGNOSIS — H3530 Unspecified macular degeneration: Secondary | ICD-10-CM | POA: Diagnosis not present

## 2015-12-23 DIAGNOSIS — R471 Dysarthria and anarthria: Secondary | ICD-10-CM | POA: Diagnosis not present

## 2015-12-23 DIAGNOSIS — G4700 Insomnia, unspecified: Secondary | ICD-10-CM | POA: Diagnosis not present

## 2015-12-23 DIAGNOSIS — K219 Gastro-esophageal reflux disease without esophagitis: Secondary | ICD-10-CM | POA: Diagnosis not present

## 2015-12-23 DIAGNOSIS — R1310 Dysphagia, unspecified: Secondary | ICD-10-CM | POA: Diagnosis not present

## 2015-12-23 DIAGNOSIS — N952 Postmenopausal atrophic vaginitis: Secondary | ICD-10-CM | POA: Diagnosis not present

## 2015-12-23 DIAGNOSIS — E46 Unspecified protein-calorie malnutrition: Secondary | ICD-10-CM | POA: Diagnosis not present

## 2015-12-23 NOTE — Progress Notes (Signed)
Patient ID: Ashley Vaughan, female   DOB: 1929-03-28, 82 y.o.   MRN: [id number]    DATE:  12/23/2015   MRN:  [id number]  BIRTHDAY: July 27, 1928  Facility:  [facility]urs[facility] Home Location:  [place] Room Number: 406-A  LEVEL OF CARE:  SNF [phone])  Contact Information    Name Relation Home Work [place] Son [phone]  [place] Daughter [phone]        Code Status History    Date Active Date Inactive Code Status Order ID Comments User Context   11/30/2014  4:38 PM 12/03/2014  8:34 PM Full Code [id number]  Wylene Simmer, MD ED    Advance Directive Documentation        Most Recent Value   Type of Advance Directive  Out of facility DNR (pink MOST or yellow form)   Pre-existing out of facility DNR order (yellow form or pink MOST form)     "MOST" Form in Place?         Chief Complaint  Patient presents with  . Medical Management of Chronic Issues    HISTORY OF PRESENT ILLNESS:   This is an 82 year old female who is a long-term resident @ [facility]. She is being seen today for a routine visit. She was recently ordered for ice cream @ lunch and dinner per patient request. Her liquids were recently changed to nectar consistency.   PAST MEDICAL HISTORY:  Past Medical History  Diagnosis Date  . Hypertension   . Osteoporosis   . Migraine (atenolol for prophylaxis)  . Macular degeneration   . Kidney stones, calcium oxalate 1993  . Pulmonary hypertension (HCC) mild,mild AR,mild-mod MR,mild-mod TR  . Hematuria, microscopic DrKimbrough 1993  . Hiatal hernia   . Diverticulosis (L sided) 1994  . Hyperlipidemia   . BCC (basal cell carcinoma of skin) L cheek-DrLupton (6/09)  . Postmenopausal bleeding 2010-DrCole  . Allergy     s/p immunotherapy  . Mitral regurgitation     mild-mod; mild aortic regurgitation, and mild-mod TR  . Atrophic vaginitis   . Hearing loss     right ear  . B12 deficiency   . ALS  (amyotrophic lateral sclerosis) ([place])   . Neuropathy ([place])   . Closed right tibial fracture   . Anxiety   . GERD (gastroesophageal reflux disease)     Esophagitis presence not specified  . Insomnia   . Constipation   . OAB (overactive bladder)   . Postmenopausal HRT (hormone replacement therapy)      CURRENT MEDICATIONS: Reviewed  Patient's Medications  New Prescriptions   No medications on file  Previous Medications   ACETAMINOPHEN (TYLENOL) 325 MG TABLET    Take 650 mg by mouth every 4 (four) hours as needed for mild pain or fever (Fever >100).   AMLODIPINE (NORVASC) 5 MG TABLET    Take 1 tablet (5 mg total) by mouth daily.   ASPIRIN 81 MG CHEWABLE TABLET    Chew 81 mg by mouth at bedtime.   CARBOXYMETHYLCELLULOSE SODIUM (REFRESH LIQUIGEL OP)    Apply 1 drop to eye 2 (two) times daily. OU   CONJUGATED ESTROGENS (PREMARIN) VAGINAL CREAM    Place 1 Applicatorful vaginally 2 (two) times a week.   CYANOCOBALAMIN (B-12) 5000 MCG SUBL    Place 1 tablet under the tongue daily.   GABAPENTIN (NEURONTIN) 100 MG CAPSULE    Take 200 mg by mouth at bedtime. Give two (2)  100 mg tablets to = 200 mg QHS   HYDROCODONE-ACETAMINOPHEN (NORCO/VICODIN) 5-325 MG TABLET    Take 1 tablet by mouth every 4 (four) hours as needed for moderate pain.   HYDROCODONE-ACETAMINOPHEN (NORCO/VICODIN) 5-325 MG TABLET    Take 1 tablet by mouth at bedtime. Do not exceed 4 gm of Tylenol in 24 hours   LORAZEPAM (ATIVAN) 0.5 MG TABLET    Take 1 tablet by mouth every night at bedtime for anxiety   MELATONIN 5 MG TABS    Take 5 mg by mouth at bedtime.   MIRABEGRON ER (MYRBETRIQ) 25 MG TB24 TABLET    Take 25 mg by mouth at bedtime.    MULTIPLE VITAMINS-MINERALS (OCUVITE EYE + MULTI) TABS    Take 2 tablets by mouth daily.    OMEPRAZOLE (PRILOSEC) 20 MG CAPSULE    Take 20 mg by mouth daily.   POLYETHYLENE GLYCOL (MIRALAX / GLYCOLAX) PACKET    Take 17 g by mouth daily as needed for mild constipation or moderate constipation.    SODIUM CHLORIDE (OCEAN) 0.65 % SOLN NASAL SPRAY    Place 1-2 sprays into both nostrils as needed for congestion.    UNABLE TO FIND    Med Name: Med Pass 90 mL by mouth twice daily for nutritional support  Modified Medications   No medications on file  Discontinued Medications   NYSTATIN (NYSTATIN) POWDER    Apply topically 2 (two) times daily. 1 application to groin and inner thigh area   POLYETHYLENE GLYCOL (MIRALAX / GLYCOLAX) PACKET    Take 8.5 g by mouth at bedtime.   POLYVINYL ALCOHOL-POVIDONE (REFRESH OP)    Apply 1 drop to eye 2 (two) times daily. Both eyes     Allergies  Allergen Reactions  . Codeine Other (See Comments)    unknown  . Lexapro [Escitalopram] Nausea Only  . Macrobid [Nitrofurantoin Monohydrate Macrocrystals] Nausea Only    Nausea and headache  . Sulfa Antibiotics Other (See Comments)    dizziness     REVIEW OF SYSTEMS:  GENERAL: no change in appetite, no fatigue, no weight changes, no fever, chills or weakness EYES: Denies change in vision, dry eyes, eye pain, itching or discharge EARS: Denies change in hearing, ringing in ears, or earache NOSE: Denies nasal congestion or epistaxis MOUTH and THROAT: Denies oral discomfort, gingival pain or bleeding, pain from teeth or hoarseness   RESPIRATORY: no cough, SOB, DOE, wheezing, hemoptysis CARDIAC: no chest pain, edema or palpitations GI: no abdominal pain, diarrhea, constipation, heart burn, nausea or vomiting PSYCHIATRIC: Denies feeling of depression or anxiety. No report of hallucinations, insomnia, paranoia, or agitation   PHYSICAL EXAMINATION  GENERAL APPEARANCE: Well nourished. In no acute distress. Normal body habitus SKIN:  Skin is warm and dry.  HEAD: Normal in size and contour. No evidence of trauma EYES: Lids open and close normally. No blepharitis, entropion or ectropion. PERRL. Conjunctivae are clear and sclerae are white. Lenses are without opacity EARS: Pinnae are normal. Patient hears normal  voice tunes of the examiner MOUTH and THROAT: Lips are without lesions. Oral mucosa is moist and without lesions. Tongue is normal in shape, size, and color and without lesions NECK: supple, trachea midline, no neck masses, no thyroid tenderness, no thyromegaly LYMPHATICS: no LAN in the neck, no supraclavicular LAN RESPIRATORY: breathing is even & unlabored, BS CTAB CARDIAC: RRR, no murmur,no extra heart sounds, no edema GI: abdomen soft, normal BS, no masses, no tenderness, no hepatomegaly, no splenomegaly EXTREMITIES:  Able  to move X 4 extremities; generalized weakness BLE; contracted fingers on bilateral hands  NEURO:  Slurred speech PSYCHIATRIC: Alert and oriented X 3. Affect and behavior are appropriate  LABS/RADIOLOGY: Labs reviewed: Basic Metabolic Panel:  Recent Labs  05/01/15 08/06/15  NA 143 143  K 3.9 3.7  BUN 14 15  CREATININE 0.4* 0.4*   Liver Function Tests:  Recent Labs  05/01/15 08/06/15  AST 10* 10*  ALT 7 7  ALKPHOS 58 55   CBC:  Recent Labs  12/25/14 05/01/15 08/06/15  WBC 5.1 5.0 5.9  NEUTROABS  --   --  3  HGB 10.6* 11.9* 12.7  HCT 33* 37 38  PLT 250 206 186     ASSESSMENT/PLAN:  OAB - continue Myrbetriq  25 mg 1 tab by mouth Q HS   Neuropathy - continue Neurontin 200 mg  Q HS  Hypertension  - well-controlled; continue Norvasc 5 mg 1 tab by mouth daily; check CMP  GERD - continue Prilosec 20 mg 1 capsule by mouth daily   ALS  - on hospice/comfort care; check CBC  Vitamin B12 deficiency - continue vitamin B12 5000 g 1 tab SL Q D  Anxiety - mood is stable; continue Ativan 0.5 mg 1 tab by mouth daily at bedtime  Closed right tibia  Fracture -  continue Norco 5/325 mg 1 tab by mouth daily at bedtime and every 4 hours when necessary for pain  Vaginal atrophy - continue Premarin vaginal cream apply 1/2 gm vaginally twice weekly   Constipation - continue  MiraLAX 17 g by mouth @ HS; start prune juice on breakfast trays  Insomnia -  continue melatonin to 5 mg 1 tab by mouth daily at bedtime    Goals of care:  Long-term care/hospice     Durenda Age, NP [place] [phone]

## 2015-12-24 DIAGNOSIS — R0600 Dyspnea, unspecified: Secondary | ICD-10-CM | POA: Diagnosis not present

## 2015-12-24 DIAGNOSIS — R1310 Dysphagia, unspecified: Secondary | ICD-10-CM | POA: Diagnosis not present

## 2015-12-24 DIAGNOSIS — J309 Allergic rhinitis, unspecified: Secondary | ICD-10-CM | POA: Diagnosis not present

## 2015-12-24 DIAGNOSIS — I10 Essential (primary) hypertension: Secondary | ICD-10-CM | POA: Diagnosis not present

## 2015-12-24 DIAGNOSIS — E785 Hyperlipidemia, unspecified: Secondary | ICD-10-CM | POA: Diagnosis not present

## 2015-12-24 DIAGNOSIS — N952 Postmenopausal atrophic vaginitis: Secondary | ICD-10-CM | POA: Diagnosis not present

## 2015-12-24 DIAGNOSIS — H3530 Unspecified macular degeneration: Secondary | ICD-10-CM | POA: Diagnosis not present

## 2015-12-24 DIAGNOSIS — R471 Dysarthria and anarthria: Secondary | ICD-10-CM | POA: Diagnosis not present

## 2015-12-24 DIAGNOSIS — E46 Unspecified protein-calorie malnutrition: Secondary | ICD-10-CM | POA: Diagnosis not present

## 2015-12-24 DIAGNOSIS — M2450 Contracture, unspecified joint: Secondary | ICD-10-CM | POA: Diagnosis not present

## 2015-12-24 DIAGNOSIS — N3281 Overactive bladder: Secondary | ICD-10-CM | POA: Diagnosis not present

## 2015-12-24 DIAGNOSIS — Z79899 Other long term (current) drug therapy: Secondary | ICD-10-CM | POA: Diagnosis not present

## 2015-12-24 DIAGNOSIS — G1221 Amyotrophic lateral sclerosis: Secondary | ICD-10-CM | POA: Diagnosis not present

## 2015-12-24 DIAGNOSIS — C4490 Unspecified malignant neoplasm of skin, unspecified: Secondary | ICD-10-CM | POA: Diagnosis not present

## 2015-12-24 LAB — HEPATIC FUNCTION PANEL
ALT: 13 U/L (ref 7–35)
AST: 14 U/L (ref 13–35)
Alkaline Phosphatase: 50 U/L (ref 25–125)
Bilirubin, Total: 0.5 mg/dL

## 2015-12-24 LAB — BASIC METABOLIC PANEL
BUN: 8 mg/dL (ref 4–21)
Creatinine: 0.4 mg/dL — AB (ref 0.5–1.1)
GLUCOSE: 95 mg/dL
POTASSIUM: 3.9 mmol/L (ref 3.4–5.3)
SODIUM: 141 mmol/L (ref 137–147)

## 2015-12-24 LAB — CBC AND DIFFERENTIAL
HCT: 41 % (ref 36–46)
Hemoglobin: 13.3 g/dL (ref 12.0–16.0)
Neutrophils Absolute: 2 /uL
Platelets: 206 10*3/uL (ref 150–399)
WBC: 4.6 10*3/mL

## 2015-12-25 DIAGNOSIS — M2450 Contracture, unspecified joint: Secondary | ICD-10-CM | POA: Diagnosis not present

## 2015-12-25 DIAGNOSIS — N952 Postmenopausal atrophic vaginitis: Secondary | ICD-10-CM | POA: Diagnosis not present

## 2015-12-25 DIAGNOSIS — E785 Hyperlipidemia, unspecified: Secondary | ICD-10-CM | POA: Diagnosis not present

## 2015-12-25 DIAGNOSIS — C4490 Unspecified malignant neoplasm of skin, unspecified: Secondary | ICD-10-CM | POA: Diagnosis not present

## 2015-12-25 DIAGNOSIS — E46 Unspecified protein-calorie malnutrition: Secondary | ICD-10-CM | POA: Diagnosis not present

## 2015-12-25 DIAGNOSIS — J309 Allergic rhinitis, unspecified: Secondary | ICD-10-CM | POA: Diagnosis not present

## 2015-12-25 DIAGNOSIS — I10 Essential (primary) hypertension: Secondary | ICD-10-CM | POA: Diagnosis not present

## 2015-12-25 DIAGNOSIS — R1310 Dysphagia, unspecified: Secondary | ICD-10-CM | POA: Diagnosis not present

## 2015-12-25 DIAGNOSIS — G1221 Amyotrophic lateral sclerosis: Secondary | ICD-10-CM | POA: Diagnosis not present

## 2015-12-25 DIAGNOSIS — R471 Dysarthria and anarthria: Secondary | ICD-10-CM | POA: Diagnosis not present

## 2015-12-25 DIAGNOSIS — R0600 Dyspnea, unspecified: Secondary | ICD-10-CM | POA: Diagnosis not present

## 2015-12-25 DIAGNOSIS — H3530 Unspecified macular degeneration: Secondary | ICD-10-CM | POA: Diagnosis not present

## 2015-12-25 DIAGNOSIS — N3281 Overactive bladder: Secondary | ICD-10-CM | POA: Diagnosis not present

## 2015-12-26 DIAGNOSIS — R1310 Dysphagia, unspecified: Secondary | ICD-10-CM | POA: Diagnosis not present

## 2015-12-26 DIAGNOSIS — M2450 Contracture, unspecified joint: Secondary | ICD-10-CM | POA: Diagnosis not present

## 2015-12-26 DIAGNOSIS — N3281 Overactive bladder: Secondary | ICD-10-CM | POA: Diagnosis not present

## 2015-12-26 DIAGNOSIS — I10 Essential (primary) hypertension: Secondary | ICD-10-CM | POA: Diagnosis not present

## 2015-12-26 DIAGNOSIS — J309 Allergic rhinitis, unspecified: Secondary | ICD-10-CM | POA: Diagnosis not present

## 2015-12-26 DIAGNOSIS — E785 Hyperlipidemia, unspecified: Secondary | ICD-10-CM | POA: Diagnosis not present

## 2015-12-26 DIAGNOSIS — H3530 Unspecified macular degeneration: Secondary | ICD-10-CM | POA: Diagnosis not present

## 2015-12-26 DIAGNOSIS — E46 Unspecified protein-calorie malnutrition: Secondary | ICD-10-CM | POA: Diagnosis not present

## 2015-12-26 DIAGNOSIS — R0600 Dyspnea, unspecified: Secondary | ICD-10-CM | POA: Diagnosis not present

## 2015-12-26 DIAGNOSIS — C4490 Unspecified malignant neoplasm of skin, unspecified: Secondary | ICD-10-CM | POA: Diagnosis not present

## 2015-12-26 DIAGNOSIS — R471 Dysarthria and anarthria: Secondary | ICD-10-CM | POA: Diagnosis not present

## 2015-12-26 DIAGNOSIS — G1221 Amyotrophic lateral sclerosis: Secondary | ICD-10-CM | POA: Diagnosis not present

## 2015-12-26 DIAGNOSIS — N952 Postmenopausal atrophic vaginitis: Secondary | ICD-10-CM | POA: Diagnosis not present

## 2015-12-27 DIAGNOSIS — R0600 Dyspnea, unspecified: Secondary | ICD-10-CM | POA: Diagnosis not present

## 2015-12-27 DIAGNOSIS — N952 Postmenopausal atrophic vaginitis: Secondary | ICD-10-CM | POA: Diagnosis not present

## 2015-12-27 DIAGNOSIS — J309 Allergic rhinitis, unspecified: Secondary | ICD-10-CM | POA: Diagnosis not present

## 2015-12-27 DIAGNOSIS — C4490 Unspecified malignant neoplasm of skin, unspecified: Secondary | ICD-10-CM | POA: Diagnosis not present

## 2015-12-27 DIAGNOSIS — H3530 Unspecified macular degeneration: Secondary | ICD-10-CM | POA: Diagnosis not present

## 2015-12-27 DIAGNOSIS — E785 Hyperlipidemia, unspecified: Secondary | ICD-10-CM | POA: Diagnosis not present

## 2015-12-27 DIAGNOSIS — M2450 Contracture, unspecified joint: Secondary | ICD-10-CM | POA: Diagnosis not present

## 2015-12-27 DIAGNOSIS — I10 Essential (primary) hypertension: Secondary | ICD-10-CM | POA: Diagnosis not present

## 2015-12-27 DIAGNOSIS — R471 Dysarthria and anarthria: Secondary | ICD-10-CM | POA: Diagnosis not present

## 2015-12-27 DIAGNOSIS — R1310 Dysphagia, unspecified: Secondary | ICD-10-CM | POA: Diagnosis not present

## 2015-12-27 DIAGNOSIS — G1221 Amyotrophic lateral sclerosis: Secondary | ICD-10-CM | POA: Diagnosis not present

## 2015-12-27 DIAGNOSIS — N3281 Overactive bladder: Secondary | ICD-10-CM | POA: Diagnosis not present

## 2015-12-27 DIAGNOSIS — E46 Unspecified protein-calorie malnutrition: Secondary | ICD-10-CM | POA: Diagnosis not present

## 2015-12-28 DIAGNOSIS — R471 Dysarthria and anarthria: Secondary | ICD-10-CM | POA: Diagnosis not present

## 2015-12-28 DIAGNOSIS — C4490 Unspecified malignant neoplasm of skin, unspecified: Secondary | ICD-10-CM | POA: Diagnosis not present

## 2015-12-28 DIAGNOSIS — R1310 Dysphagia, unspecified: Secondary | ICD-10-CM | POA: Diagnosis not present

## 2015-12-28 DIAGNOSIS — R0600 Dyspnea, unspecified: Secondary | ICD-10-CM | POA: Diagnosis not present

## 2015-12-28 DIAGNOSIS — N3281 Overactive bladder: Secondary | ICD-10-CM | POA: Diagnosis not present

## 2015-12-28 DIAGNOSIS — M2450 Contracture, unspecified joint: Secondary | ICD-10-CM | POA: Diagnosis not present

## 2015-12-28 DIAGNOSIS — J309 Allergic rhinitis, unspecified: Secondary | ICD-10-CM | POA: Diagnosis not present

## 2015-12-28 DIAGNOSIS — E785 Hyperlipidemia, unspecified: Secondary | ICD-10-CM | POA: Diagnosis not present

## 2015-12-28 DIAGNOSIS — G1221 Amyotrophic lateral sclerosis: Secondary | ICD-10-CM | POA: Diagnosis not present

## 2015-12-28 DIAGNOSIS — I10 Essential (primary) hypertension: Secondary | ICD-10-CM | POA: Diagnosis not present

## 2015-12-28 DIAGNOSIS — N952 Postmenopausal atrophic vaginitis: Secondary | ICD-10-CM | POA: Diagnosis not present

## 2015-12-28 DIAGNOSIS — H3530 Unspecified macular degeneration: Secondary | ICD-10-CM | POA: Diagnosis not present

## 2015-12-28 DIAGNOSIS — E46 Unspecified protein-calorie malnutrition: Secondary | ICD-10-CM | POA: Diagnosis not present

## 2015-12-29 DIAGNOSIS — E785 Hyperlipidemia, unspecified: Secondary | ICD-10-CM | POA: Diagnosis not present

## 2015-12-29 DIAGNOSIS — N952 Postmenopausal atrophic vaginitis: Secondary | ICD-10-CM | POA: Diagnosis not present

## 2015-12-29 DIAGNOSIS — N3281 Overactive bladder: Secondary | ICD-10-CM | POA: Diagnosis not present

## 2015-12-29 DIAGNOSIS — E46 Unspecified protein-calorie malnutrition: Secondary | ICD-10-CM | POA: Diagnosis not present

## 2015-12-29 DIAGNOSIS — M2450 Contracture, unspecified joint: Secondary | ICD-10-CM | POA: Diagnosis not present

## 2015-12-29 DIAGNOSIS — C4490 Unspecified malignant neoplasm of skin, unspecified: Secondary | ICD-10-CM | POA: Diagnosis not present

## 2015-12-29 DIAGNOSIS — R1310 Dysphagia, unspecified: Secondary | ICD-10-CM | POA: Diagnosis not present

## 2015-12-29 DIAGNOSIS — G1221 Amyotrophic lateral sclerosis: Secondary | ICD-10-CM | POA: Diagnosis not present

## 2015-12-29 DIAGNOSIS — R471 Dysarthria and anarthria: Secondary | ICD-10-CM | POA: Diagnosis not present

## 2015-12-29 DIAGNOSIS — I10 Essential (primary) hypertension: Secondary | ICD-10-CM | POA: Diagnosis not present

## 2015-12-29 DIAGNOSIS — R0600 Dyspnea, unspecified: Secondary | ICD-10-CM | POA: Diagnosis not present

## 2015-12-29 DIAGNOSIS — J309 Allergic rhinitis, unspecified: Secondary | ICD-10-CM | POA: Diagnosis not present

## 2015-12-29 DIAGNOSIS — H3530 Unspecified macular degeneration: Secondary | ICD-10-CM | POA: Diagnosis not present

## 2015-12-30 DIAGNOSIS — E46 Unspecified protein-calorie malnutrition: Secondary | ICD-10-CM | POA: Diagnosis not present

## 2015-12-30 DIAGNOSIS — C4490 Unspecified malignant neoplasm of skin, unspecified: Secondary | ICD-10-CM | POA: Diagnosis not present

## 2015-12-30 DIAGNOSIS — R0600 Dyspnea, unspecified: Secondary | ICD-10-CM | POA: Diagnosis not present

## 2015-12-30 DIAGNOSIS — M2450 Contracture, unspecified joint: Secondary | ICD-10-CM | POA: Diagnosis not present

## 2015-12-30 DIAGNOSIS — R1310 Dysphagia, unspecified: Secondary | ICD-10-CM | POA: Diagnosis not present

## 2015-12-30 DIAGNOSIS — R471 Dysarthria and anarthria: Secondary | ICD-10-CM | POA: Diagnosis not present

## 2015-12-30 DIAGNOSIS — E785 Hyperlipidemia, unspecified: Secondary | ICD-10-CM | POA: Diagnosis not present

## 2015-12-30 DIAGNOSIS — H3530 Unspecified macular degeneration: Secondary | ICD-10-CM | POA: Diagnosis not present

## 2015-12-30 DIAGNOSIS — N3281 Overactive bladder: Secondary | ICD-10-CM | POA: Diagnosis not present

## 2015-12-30 DIAGNOSIS — J309 Allergic rhinitis, unspecified: Secondary | ICD-10-CM | POA: Diagnosis not present

## 2015-12-30 DIAGNOSIS — N952 Postmenopausal atrophic vaginitis: Secondary | ICD-10-CM | POA: Diagnosis not present

## 2015-12-30 DIAGNOSIS — I10 Essential (primary) hypertension: Secondary | ICD-10-CM | POA: Diagnosis not present

## 2015-12-30 DIAGNOSIS — G1221 Amyotrophic lateral sclerosis: Secondary | ICD-10-CM | POA: Diagnosis not present

## 2015-12-31 DIAGNOSIS — H3530 Unspecified macular degeneration: Secondary | ICD-10-CM | POA: Diagnosis not present

## 2015-12-31 DIAGNOSIS — R471 Dysarthria and anarthria: Secondary | ICD-10-CM | POA: Diagnosis not present

## 2015-12-31 DIAGNOSIS — N3281 Overactive bladder: Secondary | ICD-10-CM | POA: Diagnosis not present

## 2015-12-31 DIAGNOSIS — E785 Hyperlipidemia, unspecified: Secondary | ICD-10-CM | POA: Diagnosis not present

## 2015-12-31 DIAGNOSIS — R0600 Dyspnea, unspecified: Secondary | ICD-10-CM | POA: Diagnosis not present

## 2015-12-31 DIAGNOSIS — G1221 Amyotrophic lateral sclerosis: Secondary | ICD-10-CM | POA: Diagnosis not present

## 2015-12-31 DIAGNOSIS — E46 Unspecified protein-calorie malnutrition: Secondary | ICD-10-CM | POA: Diagnosis not present

## 2015-12-31 DIAGNOSIS — M2450 Contracture, unspecified joint: Secondary | ICD-10-CM | POA: Diagnosis not present

## 2015-12-31 DIAGNOSIS — R1310 Dysphagia, unspecified: Secondary | ICD-10-CM | POA: Diagnosis not present

## 2015-12-31 DIAGNOSIS — C4490 Unspecified malignant neoplasm of skin, unspecified: Secondary | ICD-10-CM | POA: Diagnosis not present

## 2015-12-31 DIAGNOSIS — J309 Allergic rhinitis, unspecified: Secondary | ICD-10-CM | POA: Diagnosis not present

## 2015-12-31 DIAGNOSIS — I10 Essential (primary) hypertension: Secondary | ICD-10-CM | POA: Diagnosis not present

## 2015-12-31 DIAGNOSIS — N952 Postmenopausal atrophic vaginitis: Secondary | ICD-10-CM | POA: Diagnosis not present

## 2016-01-01 DIAGNOSIS — E46 Unspecified protein-calorie malnutrition: Secondary | ICD-10-CM | POA: Diagnosis not present

## 2016-01-01 DIAGNOSIS — N3281 Overactive bladder: Secondary | ICD-10-CM | POA: Diagnosis not present

## 2016-01-01 DIAGNOSIS — R471 Dysarthria and anarthria: Secondary | ICD-10-CM | POA: Diagnosis not present

## 2016-01-01 DIAGNOSIS — R1310 Dysphagia, unspecified: Secondary | ICD-10-CM | POA: Diagnosis not present

## 2016-01-01 DIAGNOSIS — R0600 Dyspnea, unspecified: Secondary | ICD-10-CM | POA: Diagnosis not present

## 2016-01-01 DIAGNOSIS — N952 Postmenopausal atrophic vaginitis: Secondary | ICD-10-CM | POA: Diagnosis not present

## 2016-01-01 DIAGNOSIS — I10 Essential (primary) hypertension: Secondary | ICD-10-CM | POA: Diagnosis not present

## 2016-01-01 DIAGNOSIS — E785 Hyperlipidemia, unspecified: Secondary | ICD-10-CM | POA: Diagnosis not present

## 2016-01-01 DIAGNOSIS — G1221 Amyotrophic lateral sclerosis: Secondary | ICD-10-CM | POA: Diagnosis not present

## 2016-01-01 DIAGNOSIS — H3530 Unspecified macular degeneration: Secondary | ICD-10-CM | POA: Diagnosis not present

## 2016-01-01 DIAGNOSIS — M2450 Contracture, unspecified joint: Secondary | ICD-10-CM | POA: Diagnosis not present

## 2016-01-01 DIAGNOSIS — J309 Allergic rhinitis, unspecified: Secondary | ICD-10-CM | POA: Diagnosis not present

## 2016-01-01 DIAGNOSIS — C4490 Unspecified malignant neoplasm of skin, unspecified: Secondary | ICD-10-CM | POA: Diagnosis not present

## 2016-01-02 DIAGNOSIS — J309 Allergic rhinitis, unspecified: Secondary | ICD-10-CM | POA: Diagnosis not present

## 2016-01-02 DIAGNOSIS — C4490 Unspecified malignant neoplasm of skin, unspecified: Secondary | ICD-10-CM | POA: Diagnosis not present

## 2016-01-02 DIAGNOSIS — R471 Dysarthria and anarthria: Secondary | ICD-10-CM | POA: Diagnosis not present

## 2016-01-02 DIAGNOSIS — E785 Hyperlipidemia, unspecified: Secondary | ICD-10-CM | POA: Diagnosis not present

## 2016-01-02 DIAGNOSIS — N952 Postmenopausal atrophic vaginitis: Secondary | ICD-10-CM | POA: Diagnosis not present

## 2016-01-02 DIAGNOSIS — M2450 Contracture, unspecified joint: Secondary | ICD-10-CM | POA: Diagnosis not present

## 2016-01-02 DIAGNOSIS — I10 Essential (primary) hypertension: Secondary | ICD-10-CM | POA: Diagnosis not present

## 2016-01-02 DIAGNOSIS — G1221 Amyotrophic lateral sclerosis: Secondary | ICD-10-CM | POA: Diagnosis not present

## 2016-01-02 DIAGNOSIS — R1310 Dysphagia, unspecified: Secondary | ICD-10-CM | POA: Diagnosis not present

## 2016-01-02 DIAGNOSIS — N3281 Overactive bladder: Secondary | ICD-10-CM | POA: Diagnosis not present

## 2016-01-02 DIAGNOSIS — R0600 Dyspnea, unspecified: Secondary | ICD-10-CM | POA: Diagnosis not present

## 2016-01-02 DIAGNOSIS — E46 Unspecified protein-calorie malnutrition: Secondary | ICD-10-CM | POA: Diagnosis not present

## 2016-01-02 DIAGNOSIS — H3530 Unspecified macular degeneration: Secondary | ICD-10-CM | POA: Diagnosis not present

## 2016-01-03 DIAGNOSIS — R0600 Dyspnea, unspecified: Secondary | ICD-10-CM | POA: Diagnosis not present

## 2016-01-03 DIAGNOSIS — G1221 Amyotrophic lateral sclerosis: Secondary | ICD-10-CM | POA: Diagnosis not present

## 2016-01-03 DIAGNOSIS — E785 Hyperlipidemia, unspecified: Secondary | ICD-10-CM | POA: Diagnosis not present

## 2016-01-03 DIAGNOSIS — N952 Postmenopausal atrophic vaginitis: Secondary | ICD-10-CM | POA: Diagnosis not present

## 2016-01-03 DIAGNOSIS — N3281 Overactive bladder: Secondary | ICD-10-CM | POA: Diagnosis not present

## 2016-01-03 DIAGNOSIS — I10 Essential (primary) hypertension: Secondary | ICD-10-CM | POA: Diagnosis not present

## 2016-01-03 DIAGNOSIS — C4490 Unspecified malignant neoplasm of skin, unspecified: Secondary | ICD-10-CM | POA: Diagnosis not present

## 2016-01-03 DIAGNOSIS — M2450 Contracture, unspecified joint: Secondary | ICD-10-CM | POA: Diagnosis not present

## 2016-01-03 DIAGNOSIS — R1310 Dysphagia, unspecified: Secondary | ICD-10-CM | POA: Diagnosis not present

## 2016-01-03 DIAGNOSIS — E46 Unspecified protein-calorie malnutrition: Secondary | ICD-10-CM | POA: Diagnosis not present

## 2016-01-03 DIAGNOSIS — R471 Dysarthria and anarthria: Secondary | ICD-10-CM | POA: Diagnosis not present

## 2016-01-03 DIAGNOSIS — J309 Allergic rhinitis, unspecified: Secondary | ICD-10-CM | POA: Diagnosis not present

## 2016-01-03 DIAGNOSIS — H3530 Unspecified macular degeneration: Secondary | ICD-10-CM | POA: Diagnosis not present

## 2016-01-04 DIAGNOSIS — E46 Unspecified protein-calorie malnutrition: Secondary | ICD-10-CM | POA: Diagnosis not present

## 2016-01-04 DIAGNOSIS — R471 Dysarthria and anarthria: Secondary | ICD-10-CM | POA: Diagnosis not present

## 2016-01-04 DIAGNOSIS — I10 Essential (primary) hypertension: Secondary | ICD-10-CM | POA: Diagnosis not present

## 2016-01-04 DIAGNOSIS — C4490 Unspecified malignant neoplasm of skin, unspecified: Secondary | ICD-10-CM | POA: Diagnosis not present

## 2016-01-04 DIAGNOSIS — R0600 Dyspnea, unspecified: Secondary | ICD-10-CM | POA: Diagnosis not present

## 2016-01-04 DIAGNOSIS — E785 Hyperlipidemia, unspecified: Secondary | ICD-10-CM | POA: Diagnosis not present

## 2016-01-04 DIAGNOSIS — R1310 Dysphagia, unspecified: Secondary | ICD-10-CM | POA: Diagnosis not present

## 2016-01-04 DIAGNOSIS — M2450 Contracture, unspecified joint: Secondary | ICD-10-CM | POA: Diagnosis not present

## 2016-01-04 DIAGNOSIS — G1221 Amyotrophic lateral sclerosis: Secondary | ICD-10-CM | POA: Diagnosis not present

## 2016-01-04 DIAGNOSIS — N3281 Overactive bladder: Secondary | ICD-10-CM | POA: Diagnosis not present

## 2016-01-04 DIAGNOSIS — J309 Allergic rhinitis, unspecified: Secondary | ICD-10-CM | POA: Diagnosis not present

## 2016-01-04 DIAGNOSIS — N952 Postmenopausal atrophic vaginitis: Secondary | ICD-10-CM | POA: Diagnosis not present

## 2016-01-04 DIAGNOSIS — H3530 Unspecified macular degeneration: Secondary | ICD-10-CM | POA: Diagnosis not present

## 2016-01-05 DIAGNOSIS — G1221 Amyotrophic lateral sclerosis: Secondary | ICD-10-CM | POA: Diagnosis not present

## 2016-01-05 DIAGNOSIS — J309 Allergic rhinitis, unspecified: Secondary | ICD-10-CM | POA: Diagnosis not present

## 2016-01-05 DIAGNOSIS — N952 Postmenopausal atrophic vaginitis: Secondary | ICD-10-CM | POA: Diagnosis not present

## 2016-01-05 DIAGNOSIS — E785 Hyperlipidemia, unspecified: Secondary | ICD-10-CM | POA: Diagnosis not present

## 2016-01-05 DIAGNOSIS — E46 Unspecified protein-calorie malnutrition: Secondary | ICD-10-CM | POA: Diagnosis not present

## 2016-01-05 DIAGNOSIS — C4490 Unspecified malignant neoplasm of skin, unspecified: Secondary | ICD-10-CM | POA: Diagnosis not present

## 2016-01-05 DIAGNOSIS — R0600 Dyspnea, unspecified: Secondary | ICD-10-CM | POA: Diagnosis not present

## 2016-01-05 DIAGNOSIS — I10 Essential (primary) hypertension: Secondary | ICD-10-CM | POA: Diagnosis not present

## 2016-01-05 DIAGNOSIS — R471 Dysarthria and anarthria: Secondary | ICD-10-CM | POA: Diagnosis not present

## 2016-01-05 DIAGNOSIS — N3281 Overactive bladder: Secondary | ICD-10-CM | POA: Diagnosis not present

## 2016-01-05 DIAGNOSIS — H3530 Unspecified macular degeneration: Secondary | ICD-10-CM | POA: Diagnosis not present

## 2016-01-05 DIAGNOSIS — R1310 Dysphagia, unspecified: Secondary | ICD-10-CM | POA: Diagnosis not present

## 2016-01-05 DIAGNOSIS — M2450 Contracture, unspecified joint: Secondary | ICD-10-CM | POA: Diagnosis not present

## 2016-01-06 ENCOUNTER — Non-Acute Institutional Stay (SKILLED_NURSING_FACILITY): Payer: Medicare Other | Admitting: Adult Health

## 2016-01-06 ENCOUNTER — Encounter: Payer: Self-pay | Admitting: Adult Health

## 2016-01-06 DIAGNOSIS — H578 Other specified disorders of eye and adnexa: Secondary | ICD-10-CM | POA: Diagnosis not present

## 2016-01-06 DIAGNOSIS — H5789 Other specified disorders of eye and adnexa: Secondary | ICD-10-CM

## 2016-01-06 DIAGNOSIS — R0600 Dyspnea, unspecified: Secondary | ICD-10-CM | POA: Diagnosis not present

## 2016-01-06 DIAGNOSIS — G1221 Amyotrophic lateral sclerosis: Secondary | ICD-10-CM | POA: Diagnosis not present

## 2016-01-06 DIAGNOSIS — H3530 Unspecified macular degeneration: Secondary | ICD-10-CM | POA: Diagnosis not present

## 2016-01-06 DIAGNOSIS — E785 Hyperlipidemia, unspecified: Secondary | ICD-10-CM | POA: Diagnosis not present

## 2016-01-06 DIAGNOSIS — M2450 Contracture, unspecified joint: Secondary | ICD-10-CM | POA: Diagnosis not present

## 2016-01-06 DIAGNOSIS — J309 Allergic rhinitis, unspecified: Secondary | ICD-10-CM | POA: Diagnosis not present

## 2016-01-06 DIAGNOSIS — I10 Essential (primary) hypertension: Secondary | ICD-10-CM | POA: Diagnosis not present

## 2016-01-06 DIAGNOSIS — C4490 Unspecified malignant neoplasm of skin, unspecified: Secondary | ICD-10-CM | POA: Diagnosis not present

## 2016-01-06 DIAGNOSIS — R471 Dysarthria and anarthria: Secondary | ICD-10-CM | POA: Diagnosis not present

## 2016-01-06 DIAGNOSIS — E46 Unspecified protein-calorie malnutrition: Secondary | ICD-10-CM | POA: Diagnosis not present

## 2016-01-06 DIAGNOSIS — N3281 Overactive bladder: Secondary | ICD-10-CM | POA: Diagnosis not present

## 2016-01-06 DIAGNOSIS — R1310 Dysphagia, unspecified: Secondary | ICD-10-CM | POA: Diagnosis not present

## 2016-01-06 DIAGNOSIS — N952 Postmenopausal atrophic vaginitis: Secondary | ICD-10-CM | POA: Diagnosis not present

## 2016-01-06 NOTE — Progress Notes (Signed)
Patient ID: Ashley Vaughan, female   DOB: 1929/01/03, 82 y.o.   MRN: [id number]   Location:   [facility] Room Number: [id number] Place of Service:  SNF (31)   CODE STATUS: DNR  Allergies  Allergen Reactions  . Codeine Other (See Comments)    unknown  . Lexapro [Escitalopram] Nausea Only  . Macrobid [Nitrofurantoin Monohydrate Macrocrystals] Nausea Only    Nausea and headache  . Sulfa Antibiotics Other (See Comments)    dizziness    Chief Complaint  Patient presents with  . Acute Visit    Right Eye concerns    HPI:  She is complaining of right eye irritation. She denies pain or itching; stating that she feels as though there is some thing in her eye. She is on refresh eye drops twice daily   Past Medical History:  Diagnosis Date  . Allergy    s/p immunotherapy  . ALS (amyotrophic lateral sclerosis) ([place])   . Anxiety   . Atrophic vaginitis   . B12 deficiency   . BCC (basal cell carcinoma of skin) L cheek-DrLupton (6/09)  . Closed right tibial fracture   . Constipation   . Diverticulosis (L sided) 1994  . GERD (gastroesophageal reflux disease)    Esophagitis presence not specified  . Hearing loss    right ear  . Hematuria, microscopic DrKimbrough 1993  . Hiatal hernia   . Hyperlipidemia   . Hypertension   . Insomnia   . Kidney stones, calcium oxalate 1993  . Macular degeneration   . Migraine (atenolol for prophylaxis)  . Mitral regurgitation    mild-mod; mild aortic regurgitation, and mild-mod TR  . Neuropathy ([place])   . OAB (overactive bladder)   . Osteoporosis   . Postmenopausal bleeding 2010-DrCole  . Postmenopausal HRT (hormone replacement therapy)   . Pulmonary hypertension (HCC) mild,mild AR,mild-mod MR,mild-mod TR    Past Surgical History:  Procedure Laterality Date  . HEMORROIDECTOMY  1953  . TONSILLECTOMY AND ADENOIDECTOMY  age 36    Social History   Social History  . Marital status: Widowed    Spouse name: N/A  . Number of children: 1   . Years of education: 7   Occupational History  . 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 Smoking status: Former Smoker    Quit date: 06/15/1978  . Smokeless tobacco: Never Used  . Alcohol use No  . Drug use: No  . Sexual activity: Not on file   Other Topics Concern  . Not on file   Social History Narrative   Lives alone, in [facility].  Son and his family live in [place].    Patient has high school education.   Caffeine- half cup in the morning.   Right handed.   She has 1 great grandson, and is expecting another soon (07/2014)   Family History  Problem Relation Age of Onset  . Cirrhosis Mother   . Cancer Father     ? they took out rotten intestines  . Cancer Brother     prostate  . Stroke Brother   . Stroke Brother 59  . Stroke Sister 49  . Breast cancer Sister 19    breast cancer  . Cancer Sister 73    breast cancer  . Heart disease Neg Hx   . Diabetes Neg Hx       VITAL SIGNS BP (!) 123/52   Pulse (!) 49   Resp 19   Ht  5\' 1"  (1.549 m)   Wt 99 lb (44.9 kg)   BMI 18.71 kg/m   Patient's Medications  New Prescriptions   No medications on file  Previous Medications   ACETAMINOPHEN (TYLENOL) 325 MG TABLET    Take 650 mg by mouth every 4 (four) hours as needed for mild pain or fever (Fever >100).   AMLODIPINE (NORVASC) 5 MG TABLET    Take 1 tablet (5 mg total) by mouth daily.   ASPIRIN 81 MG CHEWABLE TABLET    Chew 81 mg by mouth at bedtime.   CARBOXYMETHYLCELLULOSE SODIUM (REFRESH LIQUIGEL OP)    Apply 1 drop to eye 2 (two) times daily. OU   CONJUGATED ESTROGENS (PREMARIN) VAGINAL CREAM    Place 1 Applicatorful vaginally 2 (two) times a week.   CYANOCOBALAMIN (B-12) 5000 MCG SUBL    Place 1 tablet under the tongue daily.   GABAPENTIN (NEURONTIN) 100 MG CAPSULE    Take 200 mg by mouth at bedtime. Give two (2) 100 mg tablets to = 200 mg QHS   HYDROCODONE-ACETAMINOPHEN (NORCO/VICODIN) 5-325 MG TABLET    Take 1 tablet by mouth every 4  (four) hours as needed for moderate pain.   HYDROCODONE-ACETAMINOPHEN (NORCO/VICODIN) 5-325 MG TABLET    Take 1 tablet by mouth at bedtime. Do not exceed 4 gm of Tylenol in 24 hours   LORAZEPAM (ATIVAN) 0.5 MG TABLET    Take 1 tablet by mouth every night at bedtime for anxiety   MELATONIN 5 MG TABS    Take 5 mg by mouth at bedtime.   MIRABEGRON ER (MYRBETRIQ) 25 MG TB24 TABLET    Take 25 mg by mouth at bedtime.    MULTIPLE VITAMINS-MINERALS (OCUVITE EYE + MULTI) TABS    Take 2 tablets by mouth daily.    OMEPRAZOLE (PRILOSEC) 20 MG CAPSULE    Take 20 mg by mouth daily.   POLYETHYLENE GLYCOL (MIRALAX / GLYCOLAX) PACKET    Take 17 g by mouth daily as needed for mild constipation or moderate constipation.   SODIUM CHLORIDE (OCEAN) 0.65 % SOLN NASAL SPRAY    Place 1-2 sprays into both nostrils as needed for congestion.    UNABLE TO FIND    Med Name: Med Pass 90 mL by mouth twice daily for nutritional support  Modified Medications   No medications on file  Discontinued Medications   No medications on file     SIGNIFICANT DIAGNOSTIC EXAMS  LABS REVIEWED:   12-24-15; glucose 95; bun 8; creat 0.39; k+ 3.9; na++ 141; liver normal albumin 3.61     Review of Systems  Constitutional: Negative for malaise/fatigue.  HENT:       Right eye feels as though something is in it   Respiratory: Negative for cough and shortness of breath.   Cardiovascular: Negative for chest pain, palpitations and leg swelling.  Gastrointestinal: Negative for abdominal pain, constipation and heartburn.  Musculoskeletal: Negative for back pain, joint pain and myalgias.  Skin: Negative.   Neurological: Negative for dizziness.  Psychiatric/Behavioral: The patient is not nervous/anxious.      Physical Exam  Constitutional: No distress.  Frail   Eyes: Conjunctivae are normal. Pupils are equal, round, and reactive to light. Right eye exhibits no discharge. No scleral icterus.  Neck: Neck supple. No JVD present. No  thyromegaly present.  Cardiovascular: Normal rate, regular rhythm and intact distal pulses.   Respiratory: Effort normal and breath sounds normal. No respiratory distress. She has no wheezes.  GI: Soft. Bowel  sounds are normal. She exhibits no distension. There is no tenderness.  Musculoskeletal: She exhibits no edema.  Has joint deformities present    Lymphadenopathy:    She has no cervical adenopathy.  Neurological: She is alert.  Skin: Skin is warm and dry. She is not diaphoretic.  Psychiatric: She has a normal mood and affect.     ASSESSMENT/ PLAN:  1. Right eye discomfort: will increase her refresh eye drops to four times daily     Ok Edwards NP [facility]  Contact [phone] Monday through Friday 8am- 5pm  After hours call [phone]

## 2016-01-07 DIAGNOSIS — G1221 Amyotrophic lateral sclerosis: Secondary | ICD-10-CM | POA: Diagnosis not present

## 2016-01-07 DIAGNOSIS — I10 Essential (primary) hypertension: Secondary | ICD-10-CM | POA: Diagnosis not present

## 2016-01-07 DIAGNOSIS — M2450 Contracture, unspecified joint: Secondary | ICD-10-CM | POA: Diagnosis not present

## 2016-01-07 DIAGNOSIS — J309 Allergic rhinitis, unspecified: Secondary | ICD-10-CM | POA: Diagnosis not present

## 2016-01-07 DIAGNOSIS — H3530 Unspecified macular degeneration: Secondary | ICD-10-CM | POA: Diagnosis not present

## 2016-01-07 DIAGNOSIS — R1310 Dysphagia, unspecified: Secondary | ICD-10-CM | POA: Diagnosis not present

## 2016-01-07 DIAGNOSIS — N3281 Overactive bladder: Secondary | ICD-10-CM | POA: Diagnosis not present

## 2016-01-07 DIAGNOSIS — E785 Hyperlipidemia, unspecified: Secondary | ICD-10-CM | POA: Diagnosis not present

## 2016-01-07 DIAGNOSIS — E46 Unspecified protein-calorie malnutrition: Secondary | ICD-10-CM | POA: Diagnosis not present

## 2016-01-07 DIAGNOSIS — N952 Postmenopausal atrophic vaginitis: Secondary | ICD-10-CM | POA: Diagnosis not present

## 2016-01-07 DIAGNOSIS — C4490 Unspecified malignant neoplasm of skin, unspecified: Secondary | ICD-10-CM | POA: Diagnosis not present

## 2016-01-07 DIAGNOSIS — R0600 Dyspnea, unspecified: Secondary | ICD-10-CM | POA: Diagnosis not present

## 2016-01-07 DIAGNOSIS — R471 Dysarthria and anarthria: Secondary | ICD-10-CM | POA: Diagnosis not present

## 2016-01-08 DIAGNOSIS — N3281 Overactive bladder: Secondary | ICD-10-CM | POA: Diagnosis not present

## 2016-01-08 DIAGNOSIS — I10 Essential (primary) hypertension: Secondary | ICD-10-CM | POA: Diagnosis not present

## 2016-01-08 DIAGNOSIS — G1221 Amyotrophic lateral sclerosis: Secondary | ICD-10-CM | POA: Diagnosis not present

## 2016-01-08 DIAGNOSIS — H3530 Unspecified macular degeneration: Secondary | ICD-10-CM | POA: Diagnosis not present

## 2016-01-08 DIAGNOSIS — N952 Postmenopausal atrophic vaginitis: Secondary | ICD-10-CM | POA: Diagnosis not present

## 2016-01-08 DIAGNOSIS — C4490 Unspecified malignant neoplasm of skin, unspecified: Secondary | ICD-10-CM | POA: Diagnosis not present

## 2016-01-08 DIAGNOSIS — R1310 Dysphagia, unspecified: Secondary | ICD-10-CM | POA: Diagnosis not present

## 2016-01-08 DIAGNOSIS — M2450 Contracture, unspecified joint: Secondary | ICD-10-CM | POA: Diagnosis not present

## 2016-01-08 DIAGNOSIS — J309 Allergic rhinitis, unspecified: Secondary | ICD-10-CM | POA: Diagnosis not present

## 2016-01-08 DIAGNOSIS — E785 Hyperlipidemia, unspecified: Secondary | ICD-10-CM | POA: Diagnosis not present

## 2016-01-08 DIAGNOSIS — R471 Dysarthria and anarthria: Secondary | ICD-10-CM | POA: Diagnosis not present

## 2016-01-08 DIAGNOSIS — E46 Unspecified protein-calorie malnutrition: Secondary | ICD-10-CM | POA: Diagnosis not present

## 2016-01-08 DIAGNOSIS — R0600 Dyspnea, unspecified: Secondary | ICD-10-CM | POA: Diagnosis not present

## 2016-01-09 DIAGNOSIS — H3530 Unspecified macular degeneration: Secondary | ICD-10-CM | POA: Diagnosis not present

## 2016-01-09 DIAGNOSIS — G1221 Amyotrophic lateral sclerosis: Secondary | ICD-10-CM | POA: Diagnosis not present

## 2016-01-09 DIAGNOSIS — I10 Essential (primary) hypertension: Secondary | ICD-10-CM | POA: Diagnosis not present

## 2016-01-09 DIAGNOSIS — N952 Postmenopausal atrophic vaginitis: Secondary | ICD-10-CM | POA: Diagnosis not present

## 2016-01-09 DIAGNOSIS — M79671 Pain in right foot: Secondary | ICD-10-CM | POA: Diagnosis not present

## 2016-01-09 DIAGNOSIS — M79672 Pain in left foot: Secondary | ICD-10-CM | POA: Diagnosis not present

## 2016-01-09 DIAGNOSIS — R1310 Dysphagia, unspecified: Secondary | ICD-10-CM | POA: Diagnosis not present

## 2016-01-09 DIAGNOSIS — B351 Tinea unguium: Secondary | ICD-10-CM | POA: Diagnosis not present

## 2016-01-09 DIAGNOSIS — M2450 Contracture, unspecified joint: Secondary | ICD-10-CM | POA: Diagnosis not present

## 2016-01-09 DIAGNOSIS — C4490 Unspecified malignant neoplasm of skin, unspecified: Secondary | ICD-10-CM | POA: Diagnosis not present

## 2016-01-09 DIAGNOSIS — R0600 Dyspnea, unspecified: Secondary | ICD-10-CM | POA: Diagnosis not present

## 2016-01-09 DIAGNOSIS — J309 Allergic rhinitis, unspecified: Secondary | ICD-10-CM | POA: Diagnosis not present

## 2016-01-09 DIAGNOSIS — E785 Hyperlipidemia, unspecified: Secondary | ICD-10-CM | POA: Diagnosis not present

## 2016-01-09 DIAGNOSIS — R471 Dysarthria and anarthria: Secondary | ICD-10-CM | POA: Diagnosis not present

## 2016-01-09 DIAGNOSIS — E46 Unspecified protein-calorie malnutrition: Secondary | ICD-10-CM | POA: Diagnosis not present

## 2016-01-09 DIAGNOSIS — N3281 Overactive bladder: Secondary | ICD-10-CM | POA: Diagnosis not present

## 2016-01-10 DIAGNOSIS — G1221 Amyotrophic lateral sclerosis: Secondary | ICD-10-CM | POA: Diagnosis not present

## 2016-01-10 DIAGNOSIS — E785 Hyperlipidemia, unspecified: Secondary | ICD-10-CM | POA: Diagnosis not present

## 2016-01-10 DIAGNOSIS — H3530 Unspecified macular degeneration: Secondary | ICD-10-CM | POA: Diagnosis not present

## 2016-01-10 DIAGNOSIS — R1310 Dysphagia, unspecified: Secondary | ICD-10-CM | POA: Diagnosis not present

## 2016-01-10 DIAGNOSIS — R471 Dysarthria and anarthria: Secondary | ICD-10-CM | POA: Diagnosis not present

## 2016-01-10 DIAGNOSIS — E46 Unspecified protein-calorie malnutrition: Secondary | ICD-10-CM | POA: Diagnosis not present

## 2016-01-10 DIAGNOSIS — M2450 Contracture, unspecified joint: Secondary | ICD-10-CM | POA: Diagnosis not present

## 2016-01-10 DIAGNOSIS — R0600 Dyspnea, unspecified: Secondary | ICD-10-CM | POA: Diagnosis not present

## 2016-01-10 DIAGNOSIS — J309 Allergic rhinitis, unspecified: Secondary | ICD-10-CM | POA: Diagnosis not present

## 2016-01-10 DIAGNOSIS — C4490 Unspecified malignant neoplasm of skin, unspecified: Secondary | ICD-10-CM | POA: Diagnosis not present

## 2016-01-10 DIAGNOSIS — I10 Essential (primary) hypertension: Secondary | ICD-10-CM | POA: Diagnosis not present

## 2016-01-10 DIAGNOSIS — N3281 Overactive bladder: Secondary | ICD-10-CM | POA: Diagnosis not present

## 2016-01-10 DIAGNOSIS — N952 Postmenopausal atrophic vaginitis: Secondary | ICD-10-CM | POA: Diagnosis not present

## 2016-01-11 DIAGNOSIS — J309 Allergic rhinitis, unspecified: Secondary | ICD-10-CM | POA: Diagnosis not present

## 2016-01-11 DIAGNOSIS — I10 Essential (primary) hypertension: Secondary | ICD-10-CM | POA: Diagnosis not present

## 2016-01-11 DIAGNOSIS — G1221 Amyotrophic lateral sclerosis: Secondary | ICD-10-CM | POA: Diagnosis not present

## 2016-01-11 DIAGNOSIS — E785 Hyperlipidemia, unspecified: Secondary | ICD-10-CM | POA: Diagnosis not present

## 2016-01-11 DIAGNOSIS — N3281 Overactive bladder: Secondary | ICD-10-CM | POA: Diagnosis not present

## 2016-01-11 DIAGNOSIS — E46 Unspecified protein-calorie malnutrition: Secondary | ICD-10-CM | POA: Diagnosis not present

## 2016-01-11 DIAGNOSIS — C4490 Unspecified malignant neoplasm of skin, unspecified: Secondary | ICD-10-CM | POA: Diagnosis not present

## 2016-01-11 DIAGNOSIS — H3530 Unspecified macular degeneration: Secondary | ICD-10-CM | POA: Diagnosis not present

## 2016-01-11 DIAGNOSIS — R0600 Dyspnea, unspecified: Secondary | ICD-10-CM | POA: Diagnosis not present

## 2016-01-11 DIAGNOSIS — N952 Postmenopausal atrophic vaginitis: Secondary | ICD-10-CM | POA: Diagnosis not present

## 2016-01-11 DIAGNOSIS — R471 Dysarthria and anarthria: Secondary | ICD-10-CM | POA: Diagnosis not present

## 2016-01-11 DIAGNOSIS — R1310 Dysphagia, unspecified: Secondary | ICD-10-CM | POA: Diagnosis not present

## 2016-01-11 DIAGNOSIS — M2450 Contracture, unspecified joint: Secondary | ICD-10-CM | POA: Diagnosis not present

## 2016-01-12 DIAGNOSIS — H3530 Unspecified macular degeneration: Secondary | ICD-10-CM | POA: Diagnosis not present

## 2016-01-12 DIAGNOSIS — I10 Essential (primary) hypertension: Secondary | ICD-10-CM | POA: Diagnosis not present

## 2016-01-12 DIAGNOSIS — G1221 Amyotrophic lateral sclerosis: Secondary | ICD-10-CM | POA: Diagnosis not present

## 2016-01-12 DIAGNOSIS — R471 Dysarthria and anarthria: Secondary | ICD-10-CM | POA: Diagnosis not present

## 2016-01-12 DIAGNOSIS — M2450 Contracture, unspecified joint: Secondary | ICD-10-CM | POA: Diagnosis not present

## 2016-01-12 DIAGNOSIS — N3281 Overactive bladder: Secondary | ICD-10-CM | POA: Diagnosis not present

## 2016-01-12 DIAGNOSIS — E46 Unspecified protein-calorie malnutrition: Secondary | ICD-10-CM | POA: Diagnosis not present

## 2016-01-12 DIAGNOSIS — J309 Allergic rhinitis, unspecified: Secondary | ICD-10-CM | POA: Diagnosis not present

## 2016-01-12 DIAGNOSIS — R1310 Dysphagia, unspecified: Secondary | ICD-10-CM | POA: Diagnosis not present

## 2016-01-12 DIAGNOSIS — R0600 Dyspnea, unspecified: Secondary | ICD-10-CM | POA: Diagnosis not present

## 2016-01-12 DIAGNOSIS — N952 Postmenopausal atrophic vaginitis: Secondary | ICD-10-CM | POA: Diagnosis not present

## 2016-01-12 DIAGNOSIS — E785 Hyperlipidemia, unspecified: Secondary | ICD-10-CM | POA: Diagnosis not present

## 2016-01-12 DIAGNOSIS — C4490 Unspecified malignant neoplasm of skin, unspecified: Secondary | ICD-10-CM | POA: Diagnosis not present

## 2016-01-13 ENCOUNTER — Other Ambulatory Visit: Payer: Self-pay | Admitting: *Deleted

## 2016-01-13 DIAGNOSIS — J309 Allergic rhinitis, unspecified: Secondary | ICD-10-CM | POA: Diagnosis not present

## 2016-01-13 DIAGNOSIS — N952 Postmenopausal atrophic vaginitis: Secondary | ICD-10-CM | POA: Diagnosis not present

## 2016-01-13 DIAGNOSIS — C4490 Unspecified malignant neoplasm of skin, unspecified: Secondary | ICD-10-CM | POA: Diagnosis not present

## 2016-01-13 DIAGNOSIS — M2450 Contracture, unspecified joint: Secondary | ICD-10-CM | POA: Diagnosis not present

## 2016-01-13 DIAGNOSIS — R471 Dysarthria and anarthria: Secondary | ICD-10-CM | POA: Diagnosis not present

## 2016-01-13 DIAGNOSIS — R1310 Dysphagia, unspecified: Secondary | ICD-10-CM | POA: Diagnosis not present

## 2016-01-13 DIAGNOSIS — R0600 Dyspnea, unspecified: Secondary | ICD-10-CM | POA: Diagnosis not present

## 2016-01-13 DIAGNOSIS — N3281 Overactive bladder: Secondary | ICD-10-CM | POA: Diagnosis not present

## 2016-01-13 DIAGNOSIS — G1221 Amyotrophic lateral sclerosis: Secondary | ICD-10-CM | POA: Diagnosis not present

## 2016-01-13 DIAGNOSIS — H3530 Unspecified macular degeneration: Secondary | ICD-10-CM | POA: Diagnosis not present

## 2016-01-13 DIAGNOSIS — E46 Unspecified protein-calorie malnutrition: Secondary | ICD-10-CM | POA: Diagnosis not present

## 2016-01-13 DIAGNOSIS — E785 Hyperlipidemia, unspecified: Secondary | ICD-10-CM | POA: Diagnosis not present

## 2016-01-13 DIAGNOSIS — I10 Essential (primary) hypertension: Secondary | ICD-10-CM | POA: Diagnosis not present

## 2016-01-13 MED ORDER — LORAZEPAM 0.5 MG PO TABS: ORAL_TABLET | ORAL | 0 refills | Status: DC

## 2016-01-13 NOTE — Telephone Encounter (Signed)
[facility] #[phone] Fax: [phone] 

## 2016-01-14 DIAGNOSIS — E46 Unspecified protein-calorie malnutrition: Secondary | ICD-10-CM | POA: Diagnosis not present

## 2016-01-14 DIAGNOSIS — J309 Allergic rhinitis, unspecified: Secondary | ICD-10-CM | POA: Diagnosis not present

## 2016-01-14 DIAGNOSIS — M2450 Contracture, unspecified joint: Secondary | ICD-10-CM | POA: Diagnosis not present

## 2016-01-14 DIAGNOSIS — N3281 Overactive bladder: Secondary | ICD-10-CM | POA: Diagnosis not present

## 2016-01-14 DIAGNOSIS — H3530 Unspecified macular degeneration: Secondary | ICD-10-CM | POA: Diagnosis not present

## 2016-01-14 DIAGNOSIS — I10 Essential (primary) hypertension: Secondary | ICD-10-CM | POA: Diagnosis not present

## 2016-01-14 DIAGNOSIS — C4490 Unspecified malignant neoplasm of skin, unspecified: Secondary | ICD-10-CM | POA: Diagnosis not present

## 2016-01-14 DIAGNOSIS — N952 Postmenopausal atrophic vaginitis: Secondary | ICD-10-CM | POA: Diagnosis not present

## 2016-01-14 DIAGNOSIS — G1221 Amyotrophic lateral sclerosis: Secondary | ICD-10-CM | POA: Diagnosis not present

## 2016-01-14 DIAGNOSIS — R0600 Dyspnea, unspecified: Secondary | ICD-10-CM | POA: Diagnosis not present

## 2016-01-14 DIAGNOSIS — R1310 Dysphagia, unspecified: Secondary | ICD-10-CM | POA: Diagnosis not present

## 2016-01-14 DIAGNOSIS — R471 Dysarthria and anarthria: Secondary | ICD-10-CM | POA: Diagnosis not present

## 2016-01-14 DIAGNOSIS — E785 Hyperlipidemia, unspecified: Secondary | ICD-10-CM | POA: Diagnosis not present

## 2016-01-15 DIAGNOSIS — N952 Postmenopausal atrophic vaginitis: Secondary | ICD-10-CM | POA: Diagnosis not present

## 2016-01-15 DIAGNOSIS — R1310 Dysphagia, unspecified: Secondary | ICD-10-CM | POA: Diagnosis not present

## 2016-01-15 DIAGNOSIS — E46 Unspecified protein-calorie malnutrition: Secondary | ICD-10-CM | POA: Diagnosis not present

## 2016-01-15 DIAGNOSIS — R0600 Dyspnea, unspecified: Secondary | ICD-10-CM | POA: Diagnosis not present

## 2016-01-15 DIAGNOSIS — C4490 Unspecified malignant neoplasm of skin, unspecified: Secondary | ICD-10-CM | POA: Diagnosis not present

## 2016-01-15 DIAGNOSIS — H3530 Unspecified macular degeneration: Secondary | ICD-10-CM | POA: Diagnosis not present

## 2016-01-15 DIAGNOSIS — I10 Essential (primary) hypertension: Secondary | ICD-10-CM | POA: Diagnosis not present

## 2016-01-15 DIAGNOSIS — M2450 Contracture, unspecified joint: Secondary | ICD-10-CM | POA: Diagnosis not present

## 2016-01-15 DIAGNOSIS — R471 Dysarthria and anarthria: Secondary | ICD-10-CM | POA: Diagnosis not present

## 2016-01-15 DIAGNOSIS — E785 Hyperlipidemia, unspecified: Secondary | ICD-10-CM | POA: Diagnosis not present

## 2016-01-15 DIAGNOSIS — G1221 Amyotrophic lateral sclerosis: Secondary | ICD-10-CM | POA: Diagnosis not present

## 2016-01-15 DIAGNOSIS — N3281 Overactive bladder: Secondary | ICD-10-CM | POA: Diagnosis not present

## 2016-01-15 DIAGNOSIS — J309 Allergic rhinitis, unspecified: Secondary | ICD-10-CM | POA: Diagnosis not present

## 2016-01-16 ENCOUNTER — Non-Acute Institutional Stay (SKILLED_NURSING_FACILITY): Payer: Medicare Other | Admitting: Adult Health

## 2016-01-16 DIAGNOSIS — K219 Gastro-esophageal reflux disease without esophagitis: Secondary | ICD-10-CM

## 2016-01-16 DIAGNOSIS — K5900 Constipation, unspecified: Secondary | ICD-10-CM

## 2016-01-16 DIAGNOSIS — N3281 Overactive bladder: Secondary | ICD-10-CM

## 2016-01-16 DIAGNOSIS — G4700 Insomnia, unspecified: Secondary | ICD-10-CM

## 2016-01-16 DIAGNOSIS — E785 Hyperlipidemia, unspecified: Secondary | ICD-10-CM | POA: Diagnosis not present

## 2016-01-16 DIAGNOSIS — C4490 Unspecified malignant neoplasm of skin, unspecified: Secondary | ICD-10-CM | POA: Diagnosis not present

## 2016-01-16 DIAGNOSIS — G1221 Amyotrophic lateral sclerosis: Secondary | ICD-10-CM | POA: Diagnosis not present

## 2016-01-16 DIAGNOSIS — S82291S Other fracture of shaft of right tibia, sequela: Secondary | ICD-10-CM

## 2016-01-16 DIAGNOSIS — F419 Anxiety disorder, unspecified: Secondary | ICD-10-CM

## 2016-01-16 DIAGNOSIS — E46 Unspecified protein-calorie malnutrition: Secondary | ICD-10-CM | POA: Diagnosis not present

## 2016-01-16 DIAGNOSIS — N952 Postmenopausal atrophic vaginitis: Secondary | ICD-10-CM | POA: Diagnosis not present

## 2016-01-16 DIAGNOSIS — H04123 Dry eye syndrome of bilateral lacrimal glands: Secondary | ICD-10-CM

## 2016-01-16 DIAGNOSIS — I10 Essential (primary) hypertension: Secondary | ICD-10-CM

## 2016-01-16 DIAGNOSIS — R471 Dysarthria and anarthria: Secondary | ICD-10-CM | POA: Diagnosis not present

## 2016-01-16 DIAGNOSIS — J309 Allergic rhinitis, unspecified: Secondary | ICD-10-CM | POA: Diagnosis not present

## 2016-01-16 DIAGNOSIS — R1310 Dysphagia, unspecified: Secondary | ICD-10-CM | POA: Diagnosis not present

## 2016-01-16 DIAGNOSIS — H3530 Unspecified macular degeneration: Secondary | ICD-10-CM | POA: Diagnosis not present

## 2016-01-16 DIAGNOSIS — R0600 Dyspnea, unspecified: Secondary | ICD-10-CM | POA: Diagnosis not present

## 2016-01-16 DIAGNOSIS — G629 Polyneuropathy, unspecified: Secondary | ICD-10-CM

## 2016-01-16 DIAGNOSIS — M2450 Contracture, unspecified joint: Secondary | ICD-10-CM | POA: Diagnosis not present

## 2016-01-16 DIAGNOSIS — E538 Deficiency of other specified B group vitamins: Secondary | ICD-10-CM

## 2016-01-16 NOTE — Progress Notes (Signed)
Patient ID: Ashley Vaughan, female   DOB: 1928/08/08, 82 y.o.   MRN: [id number]    DATE:   01/16/16  MRN:  [id number]  BIRTHDAY: [birth year]  Facility:  Nursing Home Location:  [facility] Room Number: 406-A  LEVEL OF CARE:  [phone]  Contact Information    Name Relation Home Work [place] Son [phone]  [place] Daughter [phone]        Code Status History    Date Active Date Inactive Code Status Order ID Comments User Context   11/30/2014  4:38 PM 12/03/2014  8:34 PM Full Code [id number]  Wylene Simmer, MD ED    Advance Directive Documentation        Most Recent Value   Type of Advance Directive  Out of facility DNR (pink MOST or yellow form)   Pre-existing out of facility DNR order (yellow form or pink MOST form)     "MOST" Form in Place?         Chief Complaint  Patient presents with  . Medical Management of Chronic Issues    HISTORY OF PRESENT ILLNESS:   This is an 82 year old female who is a long-term resident @ [facility]. She is being seen today for a routine visit. Refresh eye drops frequency was recently increased due to complaints of eye irritation. She is currently followed-up by hospice.  PAST MEDICAL HISTORY:  Past Medical History:  Diagnosis Date  . Allergy    s/p immunotherapy  . ALS (amyotrophic lateral sclerosis) ([place])   . Anxiety   . Atrophic vaginitis   . B12 deficiency   . BCC (basal cell carcinoma of skin) L cheek-DrLupton (6/09)  . Closed right tibial fracture   . Constipation   . Diverticulosis (L s[place]) 1994  . GERD (gastroesophageal reflux disease)    Esophagitis presence not specified  . Hearing loss    right ear  . Hematuria, microscopic DrKimbrough 1993  . Hiatal hernia   . Hyperlipidemia   . Hypertension   . Insomnia   . Kidney stones, calcium oxalate 1993  . Macular degeneration   . Migraine (atenolol for prophylaxis)  . Mitral  regurgitation    mild-mod; mild aortic regurgitation, and mild-mod TR  . Neuropathy ([place])   . OAB (overactive bladder)   . Osteoporosis   . Postmenopausal bleeding 2010-DrCole  . Postmenopausal HRT (hormone replacement therapy)   . Pulmonary hypertension (HCC) mild,mild AR,mild-mod MR,mild-mod TR     CURRENT MEDICATIONS: Reviewed  Patient's Medications  New Prescriptions   No medications on file  Previous Medications   ACETAMINOPHEN (TYLENOL) 325 MG TABLET    Take 650 mg by mouth every 4 (four) hours as needed for mild pain or fever (Fever >100).   AMLODIPINE (NORVASC) 5 MG TABLET    Take 1 tablet (5 mg total) by mouth daily.   ASPIRIN 81 MG CHEWABLE TABLET    Chew 81 mg by mouth at bedtime.   CARBOXYMETHYLCELLULOSE SODIUM (REFRESH LIQUIGEL OP)    Apply 1 drop to eye 4 (four) times daily. OU    CONJUGATED ESTROGENS (PREMARIN) VAGINAL CREAM    Place 1 Applicatorful vaginally 2 (two) times a week.   CYANOCOBALAMIN (B-12) 5000 MCG SUBL    Place 1 tablet under the tongue daily.   GABAPENTIN (NEURONTIN) 100 MG CAPSULE    Take 200 mg by mouth at bedtime. Give two (2) 100 mg tablets to = 200 mg  QHS   HYDROCODONE-ACETAMINOPHEN (NORCO/VICODIN) 5-325 MG TABLET    Take 1 tablet by mouth every 4 (four) hours as needed for moderate pain.   HYDROCODONE-ACETAMINOPHEN (NORCO/VICODIN) 5-325 MG TABLET    Take 1 tablet by mouth at bedtime. Do not exceed 4 gm of Tylenol in 24 hours   LORAZEPAM (ATIVAN) 0.5 MG TABLET    Take 1 tablet by mouth every night at bedtime for anxiety   MELATONIN 5 MG TABS    Take 5 mg by mouth at bedtime.   MIRABEGRON ER (MYRBETRIQ) 25 MG TB24 TABLET    Take 25 mg by mouth at bedtime.    MULTIPLE VITAMINS-MINERALS (OCUVITE EYE + MULTI) TABS    Take 2 tablets by mouth daily.    NYSTATIN (NYSTATIN) POWDER    Apply topically 2 (two) times daily. Apply to groin and inner thigh BID   OMEPRAZOLE (PRILOSEC) 20 MG CAPSULE    Take 20 mg by mouth daily.   POLYETHYLENE GLYCOL (MIRALAX /  GLYCOLAX) PACKET    Take 17 g by mouth daily as needed for mild constipation or moderate constipation.   SODIUM CHLORIDE (OCEAN) 0.65 % SOLN NASAL SPRAY    Place 1-2 sprays into both nostrils as needed for congestion.    UNABLE TO FIND    Med Name: Med Pass 90 mL by mouth twice daily for nutritional support  Modified Medications   No medications on file  Discontinued Medications   No medications on file     Allergies  Allergen Reactions  . Codeine Other (See Comments)    unknown  . Lexapro [Escitalopram] Nausea Only  . Macrobid [Nitrofurantoin Monohydrate Macrocrystals] Nausea Only    Nausea and headache  . Sulfa Antibiotics Other (See Comments)    dizziness     REVIEW OF SYSTEMS:  GENERAL: no change in appetite, no fatigue, no weight changes, no fever, chills or weakness EYES: Denies change in vision, dry eyes, eye pain, itching or discharge EARS: Denies change in hearing, ringing in ears, or earache NOSE: Denies nasal congestion or epistaxis MOUTH and THROAT: Denies oral discomfort, gingival pain or bleeding, pain from teeth or hoarseness   RESPIRATORY: no cough, SOB, DOE, wheezing, hemoptysis CARDIAC: no chest pain, edema or palpitations GI: no abdominal pain, diarrhea, constipation, heart burn, nausea or vomiting PSYCHIATRIC: Denies feeling of depression or anxiety. No report of hallucinations, insomnia, paranoia, or agitation   PHYSICAL EXAMINATION  GENERAL APPEARANCE: Well nourished. In no acute distress. Normal body habitus SKIN:  Skin is warm and dry.  HEAD: Normal in size and contour. No evidence of trauma EYES: Lids open and close normally. No blepharitis, entropion or ectropion. PERRL. Conjunctivae are clear and sclerae are white. Lenses are without opacity EARS: Pinnae are normal. Patient hears normal voice tunes of the examiner MOUTH and THROAT: Lips are without lesions. Oral mucosa is moist and without lesions. Tongue is normal in shape, size, and color and  without lesions NECK: supple, trachea midline, no neck masses, no thyroid tenderness, no thyromegaly LYMPHATICS: no LAN in the neck, no supraclavicular LAN RESPIRATORY: breathing is even & unlabored, BS CTAB CARDIAC: RRR, no murmur,no extra heart sounds, no edema GI: abdomen soft, normal BS, no masses, no tenderness, no hepatomegaly, no splenomegaly EXTREMITIES:  Able to move X 4 extremities; generalized weakness BLE; contracted fingers on bilateral hands  NEURO:  Slurred speech PSYCHIATRIC: Alert and oriented X 3. Affect and behavior are appropriate  LABS/RADIOLOGY: Labs reviewed: Basic Metabolic Panel:  Recent Labs  05/01/15 08/06/15 12/24/15  NA 143 143 141  K 3.9 3.7 3.9  BUN 14 15 8   CREATININE 0.4* 0.4* 0.4*   Liver Function Tests:  Recent Labs  05/01/15 08/06/15 12/24/15  AST 10* 10* 14  ALT 7 7 13   ALKPHOS 58 55 50   CBC:  Recent Labs  05/01/15 08/06/15 12/24/15  WBC 5.0 5.9 4.6  NEUTROABS  --  3 2  HGB 11.9* 12.7 13.3  HCT 37 38 41  PLT 206 186 206     ASSESSMENT/PLAN:  Dry eyes - continue Refresh eye drops instill 1 gtt to both eyes QID  OAB - continue Myrbetriq  25 mg 1 tab by mouth Q HS   Neuropathy - continue Neurontin 200 mg  Q HS  Hypertension  - well-controlled; continue Norvasc 5 mg 1 tab by mouth daily  GERD - continue Prilosec 20 mg 1 capsule by mouth daily   ALS  - on hospice/comfort care; check CBC  Vitamin B12 deficiency - continue vitamin B12 5000 g 1 tab SL Q D Lab Results  Component Value Date   VITAMINB12 >2000 (H) 12/14/2013    Anxiety - mood is stable; continue Ativan 0.5 mg 1 tab by mouth daily at bedtime  Closed right tibia  Fracture -  continue Norco 5/325 mg 1 tab by mouth daily at bedtime and every 4 hours when necessary for pain  Vaginal atrophy - continue Premarin vaginal cream apply 1/2 gm vaginally twice weekly   Constipation - continue  MiraLAX 17 g by mouth @ HS; start prune juice on breakfast trays  Insomnia  - continue melatonin to 5 mg 1 tab by mouth daily at bedtime    Goals of care:  Long-term care/hospice     Durenda Age, NP [place] [phone]

## 2016-01-17 DIAGNOSIS — E46 Unspecified protein-calorie malnutrition: Secondary | ICD-10-CM | POA: Diagnosis not present

## 2016-01-17 DIAGNOSIS — R0600 Dyspnea, unspecified: Secondary | ICD-10-CM | POA: Diagnosis not present

## 2016-01-17 DIAGNOSIS — R471 Dysarthria and anarthria: Secondary | ICD-10-CM | POA: Diagnosis not present

## 2016-01-17 DIAGNOSIS — C4490 Unspecified malignant neoplasm of skin, unspecified: Secondary | ICD-10-CM | POA: Diagnosis not present

## 2016-01-17 DIAGNOSIS — I10 Essential (primary) hypertension: Secondary | ICD-10-CM | POA: Diagnosis not present

## 2016-01-17 DIAGNOSIS — N3281 Overactive bladder: Secondary | ICD-10-CM | POA: Diagnosis not present

## 2016-01-17 DIAGNOSIS — N952 Postmenopausal atrophic vaginitis: Secondary | ICD-10-CM | POA: Diagnosis not present

## 2016-01-17 DIAGNOSIS — E785 Hyperlipidemia, unspecified: Secondary | ICD-10-CM | POA: Diagnosis not present

## 2016-01-17 DIAGNOSIS — M2450 Contracture, unspecified joint: Secondary | ICD-10-CM | POA: Diagnosis not present

## 2016-01-17 DIAGNOSIS — G1221 Amyotrophic lateral sclerosis: Secondary | ICD-10-CM | POA: Diagnosis not present

## 2016-01-17 DIAGNOSIS — J309 Allergic rhinitis, unspecified: Secondary | ICD-10-CM | POA: Diagnosis not present

## 2016-01-17 DIAGNOSIS — H3530 Unspecified macular degeneration: Secondary | ICD-10-CM | POA: Diagnosis not present

## 2016-01-17 DIAGNOSIS — R1310 Dysphagia, unspecified: Secondary | ICD-10-CM | POA: Diagnosis not present

## 2016-01-18 DIAGNOSIS — M2450 Contracture, unspecified joint: Secondary | ICD-10-CM | POA: Diagnosis not present

## 2016-01-18 DIAGNOSIS — G1221 Amyotrophic lateral sclerosis: Secondary | ICD-10-CM | POA: Diagnosis not present

## 2016-01-18 DIAGNOSIS — N3281 Overactive bladder: Secondary | ICD-10-CM | POA: Diagnosis not present

## 2016-01-18 DIAGNOSIS — R471 Dysarthria and anarthria: Secondary | ICD-10-CM | POA: Diagnosis not present

## 2016-01-18 DIAGNOSIS — J309 Allergic rhinitis, unspecified: Secondary | ICD-10-CM | POA: Diagnosis not present

## 2016-01-18 DIAGNOSIS — H3530 Unspecified macular degeneration: Secondary | ICD-10-CM | POA: Diagnosis not present

## 2016-01-18 DIAGNOSIS — R1310 Dysphagia, unspecified: Secondary | ICD-10-CM | POA: Diagnosis not present

## 2016-01-18 DIAGNOSIS — C4490 Unspecified malignant neoplasm of skin, unspecified: Secondary | ICD-10-CM | POA: Diagnosis not present

## 2016-01-18 DIAGNOSIS — I10 Essential (primary) hypertension: Secondary | ICD-10-CM | POA: Diagnosis not present

## 2016-01-18 DIAGNOSIS — N952 Postmenopausal atrophic vaginitis: Secondary | ICD-10-CM | POA: Diagnosis not present

## 2016-01-18 DIAGNOSIS — R0600 Dyspnea, unspecified: Secondary | ICD-10-CM | POA: Diagnosis not present

## 2016-01-18 DIAGNOSIS — E46 Unspecified protein-calorie malnutrition: Secondary | ICD-10-CM | POA: Diagnosis not present

## 2016-01-18 DIAGNOSIS — E785 Hyperlipidemia, unspecified: Secondary | ICD-10-CM | POA: Diagnosis not present

## 2016-01-19 DIAGNOSIS — R1310 Dysphagia, unspecified: Secondary | ICD-10-CM | POA: Diagnosis not present

## 2016-01-19 DIAGNOSIS — N952 Postmenopausal atrophic vaginitis: Secondary | ICD-10-CM | POA: Diagnosis not present

## 2016-01-19 DIAGNOSIS — R0600 Dyspnea, unspecified: Secondary | ICD-10-CM | POA: Diagnosis not present

## 2016-01-19 DIAGNOSIS — J309 Allergic rhinitis, unspecified: Secondary | ICD-10-CM | POA: Diagnosis not present

## 2016-01-19 DIAGNOSIS — R471 Dysarthria and anarthria: Secondary | ICD-10-CM | POA: Diagnosis not present

## 2016-01-19 DIAGNOSIS — I10 Essential (primary) hypertension: Secondary | ICD-10-CM | POA: Diagnosis not present

## 2016-01-19 DIAGNOSIS — M2450 Contracture, unspecified joint: Secondary | ICD-10-CM | POA: Diagnosis not present

## 2016-01-19 DIAGNOSIS — G1221 Amyotrophic lateral sclerosis: Secondary | ICD-10-CM | POA: Diagnosis not present

## 2016-01-19 DIAGNOSIS — N3281 Overactive bladder: Secondary | ICD-10-CM | POA: Diagnosis not present

## 2016-01-19 DIAGNOSIS — E46 Unspecified protein-calorie malnutrition: Secondary | ICD-10-CM | POA: Diagnosis not present

## 2016-01-19 DIAGNOSIS — C4490 Unspecified malignant neoplasm of skin, unspecified: Secondary | ICD-10-CM | POA: Diagnosis not present

## 2016-01-19 DIAGNOSIS — H3530 Unspecified macular degeneration: Secondary | ICD-10-CM | POA: Diagnosis not present

## 2016-01-19 DIAGNOSIS — E785 Hyperlipidemia, unspecified: Secondary | ICD-10-CM | POA: Diagnosis not present

## 2016-01-20 DIAGNOSIS — J309 Allergic rhinitis, unspecified: Secondary | ICD-10-CM | POA: Diagnosis not present

## 2016-01-20 DIAGNOSIS — C4490 Unspecified malignant neoplasm of skin, unspecified: Secondary | ICD-10-CM | POA: Diagnosis not present

## 2016-01-20 DIAGNOSIS — E46 Unspecified protein-calorie malnutrition: Secondary | ICD-10-CM | POA: Diagnosis not present

## 2016-01-20 DIAGNOSIS — M2450 Contracture, unspecified joint: Secondary | ICD-10-CM | POA: Diagnosis not present

## 2016-01-20 DIAGNOSIS — H3530 Unspecified macular degeneration: Secondary | ICD-10-CM | POA: Diagnosis not present

## 2016-01-20 DIAGNOSIS — G1221 Amyotrophic lateral sclerosis: Secondary | ICD-10-CM | POA: Diagnosis not present

## 2016-01-20 DIAGNOSIS — R1310 Dysphagia, unspecified: Secondary | ICD-10-CM | POA: Diagnosis not present

## 2016-01-20 DIAGNOSIS — R0600 Dyspnea, unspecified: Secondary | ICD-10-CM | POA: Diagnosis not present

## 2016-01-20 DIAGNOSIS — E785 Hyperlipidemia, unspecified: Secondary | ICD-10-CM | POA: Diagnosis not present

## 2016-01-20 DIAGNOSIS — N3281 Overactive bladder: Secondary | ICD-10-CM | POA: Diagnosis not present

## 2016-01-20 DIAGNOSIS — N952 Postmenopausal atrophic vaginitis: Secondary | ICD-10-CM | POA: Diagnosis not present

## 2016-01-20 DIAGNOSIS — R471 Dysarthria and anarthria: Secondary | ICD-10-CM | POA: Diagnosis not present

## 2016-01-20 DIAGNOSIS — I10 Essential (primary) hypertension: Secondary | ICD-10-CM | POA: Diagnosis not present

## 2016-01-21 DIAGNOSIS — G1221 Amyotrophic lateral sclerosis: Secondary | ICD-10-CM | POA: Diagnosis not present

## 2016-01-21 DIAGNOSIS — E785 Hyperlipidemia, unspecified: Secondary | ICD-10-CM | POA: Diagnosis not present

## 2016-01-21 DIAGNOSIS — J309 Allergic rhinitis, unspecified: Secondary | ICD-10-CM | POA: Diagnosis not present

## 2016-01-21 DIAGNOSIS — M2450 Contracture, unspecified joint: Secondary | ICD-10-CM | POA: Diagnosis not present

## 2016-01-21 DIAGNOSIS — R471 Dysarthria and anarthria: Secondary | ICD-10-CM | POA: Diagnosis not present

## 2016-01-21 DIAGNOSIS — E46 Unspecified protein-calorie malnutrition: Secondary | ICD-10-CM | POA: Diagnosis not present

## 2016-01-21 DIAGNOSIS — N3281 Overactive bladder: Secondary | ICD-10-CM | POA: Diagnosis not present

## 2016-01-21 DIAGNOSIS — H3530 Unspecified macular degeneration: Secondary | ICD-10-CM | POA: Diagnosis not present

## 2016-01-21 DIAGNOSIS — R1310 Dysphagia, unspecified: Secondary | ICD-10-CM | POA: Diagnosis not present

## 2016-01-21 DIAGNOSIS — I10 Essential (primary) hypertension: Secondary | ICD-10-CM | POA: Diagnosis not present

## 2016-01-21 DIAGNOSIS — N952 Postmenopausal atrophic vaginitis: Secondary | ICD-10-CM | POA: Diagnosis not present

## 2016-01-21 DIAGNOSIS — R0600 Dyspnea, unspecified: Secondary | ICD-10-CM | POA: Diagnosis not present

## 2016-01-21 DIAGNOSIS — C4490 Unspecified malignant neoplasm of skin, unspecified: Secondary | ICD-10-CM | POA: Diagnosis not present

## 2016-01-22 DIAGNOSIS — R0600 Dyspnea, unspecified: Secondary | ICD-10-CM | POA: Diagnosis not present

## 2016-01-22 DIAGNOSIS — E785 Hyperlipidemia, unspecified: Secondary | ICD-10-CM | POA: Diagnosis not present

## 2016-01-22 DIAGNOSIS — E46 Unspecified protein-calorie malnutrition: Secondary | ICD-10-CM | POA: Diagnosis not present

## 2016-01-22 DIAGNOSIS — I10 Essential (primary) hypertension: Secondary | ICD-10-CM | POA: Diagnosis not present

## 2016-01-22 DIAGNOSIS — R471 Dysarthria and anarthria: Secondary | ICD-10-CM | POA: Diagnosis not present

## 2016-01-22 DIAGNOSIS — H3530 Unspecified macular degeneration: Secondary | ICD-10-CM | POA: Diagnosis not present

## 2016-01-22 DIAGNOSIS — J309 Allergic rhinitis, unspecified: Secondary | ICD-10-CM | POA: Diagnosis not present

## 2016-01-22 DIAGNOSIS — C4490 Unspecified malignant neoplasm of skin, unspecified: Secondary | ICD-10-CM | POA: Diagnosis not present

## 2016-01-22 DIAGNOSIS — M2450 Contracture, unspecified joint: Secondary | ICD-10-CM | POA: Diagnosis not present

## 2016-01-22 DIAGNOSIS — G1221 Amyotrophic lateral sclerosis: Secondary | ICD-10-CM | POA: Diagnosis not present

## 2016-01-22 DIAGNOSIS — N3281 Overactive bladder: Secondary | ICD-10-CM | POA: Diagnosis not present

## 2016-01-22 DIAGNOSIS — N952 Postmenopausal atrophic vaginitis: Secondary | ICD-10-CM | POA: Diagnosis not present

## 2016-01-22 DIAGNOSIS — R1310 Dysphagia, unspecified: Secondary | ICD-10-CM | POA: Diagnosis not present

## 2016-01-23 DIAGNOSIS — E46 Unspecified protein-calorie malnutrition: Secondary | ICD-10-CM | POA: Diagnosis not present

## 2016-01-23 DIAGNOSIS — J309 Allergic rhinitis, unspecified: Secondary | ICD-10-CM | POA: Diagnosis not present

## 2016-01-23 DIAGNOSIS — R1310 Dysphagia, unspecified: Secondary | ICD-10-CM | POA: Diagnosis not present

## 2016-01-23 DIAGNOSIS — E785 Hyperlipidemia, unspecified: Secondary | ICD-10-CM | POA: Diagnosis not present

## 2016-01-23 DIAGNOSIS — R0600 Dyspnea, unspecified: Secondary | ICD-10-CM | POA: Diagnosis not present

## 2016-01-23 DIAGNOSIS — N3281 Overactive bladder: Secondary | ICD-10-CM | POA: Diagnosis not present

## 2016-01-23 DIAGNOSIS — R471 Dysarthria and anarthria: Secondary | ICD-10-CM | POA: Diagnosis not present

## 2016-01-23 DIAGNOSIS — H3530 Unspecified macular degeneration: Secondary | ICD-10-CM | POA: Diagnosis not present

## 2016-01-23 DIAGNOSIS — I10 Essential (primary) hypertension: Secondary | ICD-10-CM | POA: Diagnosis not present

## 2016-01-23 DIAGNOSIS — G1221 Amyotrophic lateral sclerosis: Secondary | ICD-10-CM | POA: Diagnosis not present

## 2016-01-23 DIAGNOSIS — C4490 Unspecified malignant neoplasm of skin, unspecified: Secondary | ICD-10-CM | POA: Diagnosis not present

## 2016-01-23 DIAGNOSIS — N952 Postmenopausal atrophic vaginitis: Secondary | ICD-10-CM | POA: Diagnosis not present

## 2016-01-23 DIAGNOSIS — M2450 Contracture, unspecified joint: Secondary | ICD-10-CM | POA: Diagnosis not present

## 2016-01-24 DIAGNOSIS — M2450 Contracture, unspecified joint: Secondary | ICD-10-CM | POA: Diagnosis not present

## 2016-01-24 DIAGNOSIS — R0600 Dyspnea, unspecified: Secondary | ICD-10-CM | POA: Diagnosis not present

## 2016-01-24 DIAGNOSIS — I10 Essential (primary) hypertension: Secondary | ICD-10-CM | POA: Diagnosis not present

## 2016-01-24 DIAGNOSIS — N3281 Overactive bladder: Secondary | ICD-10-CM | POA: Diagnosis not present

## 2016-01-24 DIAGNOSIS — H3530 Unspecified macular degeneration: Secondary | ICD-10-CM | POA: Diagnosis not present

## 2016-01-24 DIAGNOSIS — J309 Allergic rhinitis, unspecified: Secondary | ICD-10-CM | POA: Diagnosis not present

## 2016-01-24 DIAGNOSIS — G1221 Amyotrophic lateral sclerosis: Secondary | ICD-10-CM | POA: Diagnosis not present

## 2016-01-24 DIAGNOSIS — N952 Postmenopausal atrophic vaginitis: Secondary | ICD-10-CM | POA: Diagnosis not present

## 2016-01-24 DIAGNOSIS — E46 Unspecified protein-calorie malnutrition: Secondary | ICD-10-CM | POA: Diagnosis not present

## 2016-01-24 DIAGNOSIS — R1310 Dysphagia, unspecified: Secondary | ICD-10-CM | POA: Diagnosis not present

## 2016-01-24 DIAGNOSIS — E785 Hyperlipidemia, unspecified: Secondary | ICD-10-CM | POA: Diagnosis not present

## 2016-01-24 DIAGNOSIS — C4490 Unspecified malignant neoplasm of skin, unspecified: Secondary | ICD-10-CM | POA: Diagnosis not present

## 2016-01-24 DIAGNOSIS — R471 Dysarthria and anarthria: Secondary | ICD-10-CM | POA: Diagnosis not present

## 2016-01-25 DIAGNOSIS — N952 Postmenopausal atrophic vaginitis: Secondary | ICD-10-CM | POA: Diagnosis not present

## 2016-01-25 DIAGNOSIS — H3530 Unspecified macular degeneration: Secondary | ICD-10-CM | POA: Diagnosis not present

## 2016-01-25 DIAGNOSIS — E785 Hyperlipidemia, unspecified: Secondary | ICD-10-CM | POA: Diagnosis not present

## 2016-01-25 DIAGNOSIS — N3281 Overactive bladder: Secondary | ICD-10-CM | POA: Diagnosis not present

## 2016-01-25 DIAGNOSIS — R0600 Dyspnea, unspecified: Secondary | ICD-10-CM | POA: Diagnosis not present

## 2016-01-25 DIAGNOSIS — I10 Essential (primary) hypertension: Secondary | ICD-10-CM | POA: Diagnosis not present

## 2016-01-25 DIAGNOSIS — M2450 Contracture, unspecified joint: Secondary | ICD-10-CM | POA: Diagnosis not present

## 2016-01-25 DIAGNOSIS — J309 Allergic rhinitis, unspecified: Secondary | ICD-10-CM | POA: Diagnosis not present

## 2016-01-25 DIAGNOSIS — G1221 Amyotrophic lateral sclerosis: Secondary | ICD-10-CM | POA: Diagnosis not present

## 2016-01-25 DIAGNOSIS — R1310 Dysphagia, unspecified: Secondary | ICD-10-CM | POA: Diagnosis not present

## 2016-01-25 DIAGNOSIS — E46 Unspecified protein-calorie malnutrition: Secondary | ICD-10-CM | POA: Diagnosis not present

## 2016-01-25 DIAGNOSIS — R471 Dysarthria and anarthria: Secondary | ICD-10-CM | POA: Diagnosis not present

## 2016-01-25 DIAGNOSIS — C4490 Unspecified malignant neoplasm of skin, unspecified: Secondary | ICD-10-CM | POA: Diagnosis not present

## 2016-01-26 DIAGNOSIS — J309 Allergic rhinitis, unspecified: Secondary | ICD-10-CM | POA: Diagnosis not present

## 2016-01-26 DIAGNOSIS — E46 Unspecified protein-calorie malnutrition: Secondary | ICD-10-CM | POA: Diagnosis not present

## 2016-01-26 DIAGNOSIS — G1221 Amyotrophic lateral sclerosis: Secondary | ICD-10-CM | POA: Diagnosis not present

## 2016-01-26 DIAGNOSIS — R471 Dysarthria and anarthria: Secondary | ICD-10-CM | POA: Diagnosis not present

## 2016-01-26 DIAGNOSIS — M2450 Contracture, unspecified joint: Secondary | ICD-10-CM | POA: Diagnosis not present

## 2016-01-26 DIAGNOSIS — R1310 Dysphagia, unspecified: Secondary | ICD-10-CM | POA: Diagnosis not present

## 2016-01-26 DIAGNOSIS — C4490 Unspecified malignant neoplasm of skin, unspecified: Secondary | ICD-10-CM | POA: Diagnosis not present

## 2016-01-26 DIAGNOSIS — R0600 Dyspnea, unspecified: Secondary | ICD-10-CM | POA: Diagnosis not present

## 2016-01-26 DIAGNOSIS — N3281 Overactive bladder: Secondary | ICD-10-CM | POA: Diagnosis not present

## 2016-01-26 DIAGNOSIS — E785 Hyperlipidemia, unspecified: Secondary | ICD-10-CM | POA: Diagnosis not present

## 2016-01-26 DIAGNOSIS — H3530 Unspecified macular degeneration: Secondary | ICD-10-CM | POA: Diagnosis not present

## 2016-01-26 DIAGNOSIS — I10 Essential (primary) hypertension: Secondary | ICD-10-CM | POA: Diagnosis not present

## 2016-01-26 DIAGNOSIS — N952 Postmenopausal atrophic vaginitis: Secondary | ICD-10-CM | POA: Diagnosis not present

## 2016-01-27 DIAGNOSIS — E46 Unspecified protein-calorie malnutrition: Secondary | ICD-10-CM | POA: Diagnosis not present

## 2016-01-27 DIAGNOSIS — N3281 Overactive bladder: Secondary | ICD-10-CM | POA: Diagnosis not present

## 2016-01-27 DIAGNOSIS — N952 Postmenopausal atrophic vaginitis: Secondary | ICD-10-CM | POA: Diagnosis not present

## 2016-01-27 DIAGNOSIS — R1310 Dysphagia, unspecified: Secondary | ICD-10-CM | POA: Diagnosis not present

## 2016-01-27 DIAGNOSIS — R0600 Dyspnea, unspecified: Secondary | ICD-10-CM | POA: Diagnosis not present

## 2016-01-27 DIAGNOSIS — G1221 Amyotrophic lateral sclerosis: Secondary | ICD-10-CM | POA: Diagnosis not present

## 2016-01-27 DIAGNOSIS — R471 Dysarthria and anarthria: Secondary | ICD-10-CM | POA: Diagnosis not present

## 2016-01-27 DIAGNOSIS — H3530 Unspecified macular degeneration: Secondary | ICD-10-CM | POA: Diagnosis not present

## 2016-01-27 DIAGNOSIS — M2450 Contracture, unspecified joint: Secondary | ICD-10-CM | POA: Diagnosis not present

## 2016-01-27 DIAGNOSIS — J309 Allergic rhinitis, unspecified: Secondary | ICD-10-CM | POA: Diagnosis not present

## 2016-01-27 DIAGNOSIS — C4490 Unspecified malignant neoplasm of skin, unspecified: Secondary | ICD-10-CM | POA: Diagnosis not present

## 2016-01-27 DIAGNOSIS — I10 Essential (primary) hypertension: Secondary | ICD-10-CM | POA: Diagnosis not present

## 2016-01-27 DIAGNOSIS — E785 Hyperlipidemia, unspecified: Secondary | ICD-10-CM | POA: Diagnosis not present

## 2016-01-28 DIAGNOSIS — M2450 Contracture, unspecified joint: Secondary | ICD-10-CM | POA: Diagnosis not present

## 2016-01-28 DIAGNOSIS — C4490 Unspecified malignant neoplasm of skin, unspecified: Secondary | ICD-10-CM | POA: Diagnosis not present

## 2016-01-28 DIAGNOSIS — G1221 Amyotrophic lateral sclerosis: Secondary | ICD-10-CM | POA: Diagnosis not present

## 2016-01-28 DIAGNOSIS — E46 Unspecified protein-calorie malnutrition: Secondary | ICD-10-CM | POA: Diagnosis not present

## 2016-01-28 DIAGNOSIS — N3281 Overactive bladder: Secondary | ICD-10-CM | POA: Diagnosis not present

## 2016-01-28 DIAGNOSIS — R471 Dysarthria and anarthria: Secondary | ICD-10-CM | POA: Diagnosis not present

## 2016-01-28 DIAGNOSIS — R1310 Dysphagia, unspecified: Secondary | ICD-10-CM | POA: Diagnosis not present

## 2016-01-28 DIAGNOSIS — H3530 Unspecified macular degeneration: Secondary | ICD-10-CM | POA: Diagnosis not present

## 2016-01-28 DIAGNOSIS — R0600 Dyspnea, unspecified: Secondary | ICD-10-CM | POA: Diagnosis not present

## 2016-01-28 DIAGNOSIS — E785 Hyperlipidemia, unspecified: Secondary | ICD-10-CM | POA: Diagnosis not present

## 2016-01-28 DIAGNOSIS — I10 Essential (primary) hypertension: Secondary | ICD-10-CM | POA: Diagnosis not present

## 2016-01-28 DIAGNOSIS — N952 Postmenopausal atrophic vaginitis: Secondary | ICD-10-CM | POA: Diagnosis not present

## 2016-01-28 DIAGNOSIS — J309 Allergic rhinitis, unspecified: Secondary | ICD-10-CM | POA: Diagnosis not present

## 2016-01-29 DIAGNOSIS — E785 Hyperlipidemia, unspecified: Secondary | ICD-10-CM | POA: Diagnosis not present

## 2016-01-29 DIAGNOSIS — R1310 Dysphagia, unspecified: Secondary | ICD-10-CM | POA: Diagnosis not present

## 2016-01-29 DIAGNOSIS — N3281 Overactive bladder: Secondary | ICD-10-CM | POA: Diagnosis not present

## 2016-01-29 DIAGNOSIS — C4490 Unspecified malignant neoplasm of skin, unspecified: Secondary | ICD-10-CM | POA: Diagnosis not present

## 2016-01-29 DIAGNOSIS — M2450 Contracture, unspecified joint: Secondary | ICD-10-CM | POA: Diagnosis not present

## 2016-01-29 DIAGNOSIS — G1221 Amyotrophic lateral sclerosis: Secondary | ICD-10-CM | POA: Diagnosis not present

## 2016-01-29 DIAGNOSIS — R471 Dysarthria and anarthria: Secondary | ICD-10-CM | POA: Diagnosis not present

## 2016-01-29 DIAGNOSIS — N952 Postmenopausal atrophic vaginitis: Secondary | ICD-10-CM | POA: Diagnosis not present

## 2016-01-29 DIAGNOSIS — R0600 Dyspnea, unspecified: Secondary | ICD-10-CM | POA: Diagnosis not present

## 2016-01-29 DIAGNOSIS — J309 Allergic rhinitis, unspecified: Secondary | ICD-10-CM | POA: Diagnosis not present

## 2016-01-29 DIAGNOSIS — I10 Essential (primary) hypertension: Secondary | ICD-10-CM | POA: Diagnosis not present

## 2016-01-29 DIAGNOSIS — H3530 Unspecified macular degeneration: Secondary | ICD-10-CM | POA: Diagnosis not present

## 2016-01-29 DIAGNOSIS — E46 Unspecified protein-calorie malnutrition: Secondary | ICD-10-CM | POA: Diagnosis not present

## 2016-01-30 DIAGNOSIS — N952 Postmenopausal atrophic vaginitis: Secondary | ICD-10-CM | POA: Diagnosis not present

## 2016-01-30 DIAGNOSIS — H3530 Unspecified macular degeneration: Secondary | ICD-10-CM | POA: Diagnosis not present

## 2016-01-30 DIAGNOSIS — J309 Allergic rhinitis, unspecified: Secondary | ICD-10-CM | POA: Diagnosis not present

## 2016-01-30 DIAGNOSIS — M2450 Contracture, unspecified joint: Secondary | ICD-10-CM | POA: Diagnosis not present

## 2016-01-30 DIAGNOSIS — R471 Dysarthria and anarthria: Secondary | ICD-10-CM | POA: Diagnosis not present

## 2016-01-30 DIAGNOSIS — E785 Hyperlipidemia, unspecified: Secondary | ICD-10-CM | POA: Diagnosis not present

## 2016-01-30 DIAGNOSIS — R0600 Dyspnea, unspecified: Secondary | ICD-10-CM | POA: Diagnosis not present

## 2016-01-30 DIAGNOSIS — E46 Unspecified protein-calorie malnutrition: Secondary | ICD-10-CM | POA: Diagnosis not present

## 2016-01-30 DIAGNOSIS — R1310 Dysphagia, unspecified: Secondary | ICD-10-CM | POA: Diagnosis not present

## 2016-01-30 DIAGNOSIS — G1221 Amyotrophic lateral sclerosis: Secondary | ICD-10-CM | POA: Diagnosis not present

## 2016-01-30 DIAGNOSIS — C4490 Unspecified malignant neoplasm of skin, unspecified: Secondary | ICD-10-CM | POA: Diagnosis not present

## 2016-01-30 DIAGNOSIS — I10 Essential (primary) hypertension: Secondary | ICD-10-CM | POA: Diagnosis not present

## 2016-01-30 DIAGNOSIS — N3281 Overactive bladder: Secondary | ICD-10-CM | POA: Diagnosis not present

## 2016-01-31 DIAGNOSIS — R471 Dysarthria and anarthria: Secondary | ICD-10-CM | POA: Diagnosis not present

## 2016-01-31 DIAGNOSIS — E46 Unspecified protein-calorie malnutrition: Secondary | ICD-10-CM | POA: Diagnosis not present

## 2016-01-31 DIAGNOSIS — G1221 Amyotrophic lateral sclerosis: Secondary | ICD-10-CM | POA: Diagnosis not present

## 2016-01-31 DIAGNOSIS — R1310 Dysphagia, unspecified: Secondary | ICD-10-CM | POA: Diagnosis not present

## 2016-01-31 DIAGNOSIS — N952 Postmenopausal atrophic vaginitis: Secondary | ICD-10-CM | POA: Diagnosis not present

## 2016-01-31 DIAGNOSIS — C4490 Unspecified malignant neoplasm of skin, unspecified: Secondary | ICD-10-CM | POA: Diagnosis not present

## 2016-01-31 DIAGNOSIS — J309 Allergic rhinitis, unspecified: Secondary | ICD-10-CM | POA: Diagnosis not present

## 2016-01-31 DIAGNOSIS — N3281 Overactive bladder: Secondary | ICD-10-CM | POA: Diagnosis not present

## 2016-01-31 DIAGNOSIS — M2450 Contracture, unspecified joint: Secondary | ICD-10-CM | POA: Diagnosis not present

## 2016-01-31 DIAGNOSIS — I10 Essential (primary) hypertension: Secondary | ICD-10-CM | POA: Diagnosis not present

## 2016-01-31 DIAGNOSIS — E785 Hyperlipidemia, unspecified: Secondary | ICD-10-CM | POA: Diagnosis not present

## 2016-01-31 DIAGNOSIS — H3530 Unspecified macular degeneration: Secondary | ICD-10-CM | POA: Diagnosis not present

## 2016-01-31 DIAGNOSIS — R0600 Dyspnea, unspecified: Secondary | ICD-10-CM | POA: Diagnosis not present

## 2016-02-01 DIAGNOSIS — C4490 Unspecified malignant neoplasm of skin, unspecified: Secondary | ICD-10-CM | POA: Diagnosis not present

## 2016-02-01 DIAGNOSIS — N952 Postmenopausal atrophic vaginitis: Secondary | ICD-10-CM | POA: Diagnosis not present

## 2016-02-01 DIAGNOSIS — H3530 Unspecified macular degeneration: Secondary | ICD-10-CM | POA: Diagnosis not present

## 2016-02-01 DIAGNOSIS — J309 Allergic rhinitis, unspecified: Secondary | ICD-10-CM | POA: Diagnosis not present

## 2016-02-01 DIAGNOSIS — I10 Essential (primary) hypertension: Secondary | ICD-10-CM | POA: Diagnosis not present

## 2016-02-01 DIAGNOSIS — R1310 Dysphagia, unspecified: Secondary | ICD-10-CM | POA: Diagnosis not present

## 2016-02-01 DIAGNOSIS — G1221 Amyotrophic lateral sclerosis: Secondary | ICD-10-CM | POA: Diagnosis not present

## 2016-02-01 DIAGNOSIS — R471 Dysarthria and anarthria: Secondary | ICD-10-CM | POA: Diagnosis not present

## 2016-02-01 DIAGNOSIS — M2450 Contracture, unspecified joint: Secondary | ICD-10-CM | POA: Diagnosis not present

## 2016-02-01 DIAGNOSIS — E785 Hyperlipidemia, unspecified: Secondary | ICD-10-CM | POA: Diagnosis not present

## 2016-02-01 DIAGNOSIS — N3281 Overactive bladder: Secondary | ICD-10-CM | POA: Diagnosis not present

## 2016-02-01 DIAGNOSIS — E46 Unspecified protein-calorie malnutrition: Secondary | ICD-10-CM | POA: Diagnosis not present

## 2016-02-01 DIAGNOSIS — R0600 Dyspnea, unspecified: Secondary | ICD-10-CM | POA: Diagnosis not present

## 2016-02-02 DIAGNOSIS — J309 Allergic rhinitis, unspecified: Secondary | ICD-10-CM | POA: Diagnosis not present

## 2016-02-02 DIAGNOSIS — M2450 Contracture, unspecified joint: Secondary | ICD-10-CM | POA: Diagnosis not present

## 2016-02-02 DIAGNOSIS — I10 Essential (primary) hypertension: Secondary | ICD-10-CM | POA: Diagnosis not present

## 2016-02-02 DIAGNOSIS — H3530 Unspecified macular degeneration: Secondary | ICD-10-CM | POA: Diagnosis not present

## 2016-02-02 DIAGNOSIS — G1221 Amyotrophic lateral sclerosis: Secondary | ICD-10-CM | POA: Diagnosis not present

## 2016-02-02 DIAGNOSIS — E785 Hyperlipidemia, unspecified: Secondary | ICD-10-CM | POA: Diagnosis not present

## 2016-02-02 DIAGNOSIS — N3281 Overactive bladder: Secondary | ICD-10-CM | POA: Diagnosis not present

## 2016-02-02 DIAGNOSIS — R1310 Dysphagia, unspecified: Secondary | ICD-10-CM | POA: Diagnosis not present

## 2016-02-02 DIAGNOSIS — C4490 Unspecified malignant neoplasm of skin, unspecified: Secondary | ICD-10-CM | POA: Diagnosis not present

## 2016-02-02 DIAGNOSIS — R0600 Dyspnea, unspecified: Secondary | ICD-10-CM | POA: Diagnosis not present

## 2016-02-02 DIAGNOSIS — R471 Dysarthria and anarthria: Secondary | ICD-10-CM | POA: Diagnosis not present

## 2016-02-02 DIAGNOSIS — E46 Unspecified protein-calorie malnutrition: Secondary | ICD-10-CM | POA: Diagnosis not present

## 2016-02-02 DIAGNOSIS — N952 Postmenopausal atrophic vaginitis: Secondary | ICD-10-CM | POA: Diagnosis not present

## 2016-02-03 DIAGNOSIS — R0600 Dyspnea, unspecified: Secondary | ICD-10-CM | POA: Diagnosis not present

## 2016-02-03 DIAGNOSIS — R471 Dysarthria and anarthria: Secondary | ICD-10-CM | POA: Diagnosis not present

## 2016-02-03 DIAGNOSIS — G1221 Amyotrophic lateral sclerosis: Secondary | ICD-10-CM | POA: Diagnosis not present

## 2016-02-03 DIAGNOSIS — M2450 Contracture, unspecified joint: Secondary | ICD-10-CM | POA: Diagnosis not present

## 2016-02-03 DIAGNOSIS — H3530 Unspecified macular degeneration: Secondary | ICD-10-CM | POA: Diagnosis not present

## 2016-02-03 DIAGNOSIS — J309 Allergic rhinitis, unspecified: Secondary | ICD-10-CM | POA: Diagnosis not present

## 2016-02-03 DIAGNOSIS — E46 Unspecified protein-calorie malnutrition: Secondary | ICD-10-CM | POA: Diagnosis not present

## 2016-02-03 DIAGNOSIS — E785 Hyperlipidemia, unspecified: Secondary | ICD-10-CM | POA: Diagnosis not present

## 2016-02-03 DIAGNOSIS — N3281 Overactive bladder: Secondary | ICD-10-CM | POA: Diagnosis not present

## 2016-02-03 DIAGNOSIS — I10 Essential (primary) hypertension: Secondary | ICD-10-CM | POA: Diagnosis not present

## 2016-02-03 DIAGNOSIS — R1310 Dysphagia, unspecified: Secondary | ICD-10-CM | POA: Diagnosis not present

## 2016-02-03 DIAGNOSIS — C4490 Unspecified malignant neoplasm of skin, unspecified: Secondary | ICD-10-CM | POA: Diagnosis not present

## 2016-02-03 DIAGNOSIS — N952 Postmenopausal atrophic vaginitis: Secondary | ICD-10-CM | POA: Diagnosis not present

## 2016-02-04 DIAGNOSIS — R0600 Dyspnea, unspecified: Secondary | ICD-10-CM | POA: Diagnosis not present

## 2016-02-04 DIAGNOSIS — G1221 Amyotrophic lateral sclerosis: Secondary | ICD-10-CM | POA: Diagnosis not present

## 2016-02-04 DIAGNOSIS — N3281 Overactive bladder: Secondary | ICD-10-CM | POA: Diagnosis not present

## 2016-02-04 DIAGNOSIS — I10 Essential (primary) hypertension: Secondary | ICD-10-CM | POA: Diagnosis not present

## 2016-02-04 DIAGNOSIS — N952 Postmenopausal atrophic vaginitis: Secondary | ICD-10-CM | POA: Diagnosis not present

## 2016-02-04 DIAGNOSIS — E46 Unspecified protein-calorie malnutrition: Secondary | ICD-10-CM | POA: Diagnosis not present

## 2016-02-04 DIAGNOSIS — R1310 Dysphagia, unspecified: Secondary | ICD-10-CM | POA: Diagnosis not present

## 2016-02-04 DIAGNOSIS — R471 Dysarthria and anarthria: Secondary | ICD-10-CM | POA: Diagnosis not present

## 2016-02-04 DIAGNOSIS — H3530 Unspecified macular degeneration: Secondary | ICD-10-CM | POA: Diagnosis not present

## 2016-02-04 DIAGNOSIS — C4490 Unspecified malignant neoplasm of skin, unspecified: Secondary | ICD-10-CM | POA: Diagnosis not present

## 2016-02-04 DIAGNOSIS — J309 Allergic rhinitis, unspecified: Secondary | ICD-10-CM | POA: Diagnosis not present

## 2016-02-04 DIAGNOSIS — M2450 Contracture, unspecified joint: Secondary | ICD-10-CM | POA: Diagnosis not present

## 2016-02-04 DIAGNOSIS — E785 Hyperlipidemia, unspecified: Secondary | ICD-10-CM | POA: Diagnosis not present

## 2016-02-05 DIAGNOSIS — H3530 Unspecified macular degeneration: Secondary | ICD-10-CM | POA: Diagnosis not present

## 2016-02-05 DIAGNOSIS — R0600 Dyspnea, unspecified: Secondary | ICD-10-CM | POA: Diagnosis not present

## 2016-02-05 DIAGNOSIS — J309 Allergic rhinitis, unspecified: Secondary | ICD-10-CM | POA: Diagnosis not present

## 2016-02-05 DIAGNOSIS — E785 Hyperlipidemia, unspecified: Secondary | ICD-10-CM | POA: Diagnosis not present

## 2016-02-05 DIAGNOSIS — I10 Essential (primary) hypertension: Secondary | ICD-10-CM | POA: Diagnosis not present

## 2016-02-05 DIAGNOSIS — N3281 Overactive bladder: Secondary | ICD-10-CM | POA: Diagnosis not present

## 2016-02-05 DIAGNOSIS — R1310 Dysphagia, unspecified: Secondary | ICD-10-CM | POA: Diagnosis not present

## 2016-02-05 DIAGNOSIS — E46 Unspecified protein-calorie malnutrition: Secondary | ICD-10-CM | POA: Diagnosis not present

## 2016-02-05 DIAGNOSIS — G1221 Amyotrophic lateral sclerosis: Secondary | ICD-10-CM | POA: Diagnosis not present

## 2016-02-05 DIAGNOSIS — C4490 Unspecified malignant neoplasm of skin, unspecified: Secondary | ICD-10-CM | POA: Diagnosis not present

## 2016-02-05 DIAGNOSIS — M2450 Contracture, unspecified joint: Secondary | ICD-10-CM | POA: Diagnosis not present

## 2016-02-05 DIAGNOSIS — N952 Postmenopausal atrophic vaginitis: Secondary | ICD-10-CM | POA: Diagnosis not present

## 2016-02-05 DIAGNOSIS — R471 Dysarthria and anarthria: Secondary | ICD-10-CM | POA: Diagnosis not present

## 2016-02-06 DIAGNOSIS — N3281 Overactive bladder: Secondary | ICD-10-CM | POA: Diagnosis not present

## 2016-02-06 DIAGNOSIS — E785 Hyperlipidemia, unspecified: Secondary | ICD-10-CM | POA: Diagnosis not present

## 2016-02-06 DIAGNOSIS — C4490 Unspecified malignant neoplasm of skin, unspecified: Secondary | ICD-10-CM | POA: Diagnosis not present

## 2016-02-06 DIAGNOSIS — R471 Dysarthria and anarthria: Secondary | ICD-10-CM | POA: Diagnosis not present

## 2016-02-06 DIAGNOSIS — R1310 Dysphagia, unspecified: Secondary | ICD-10-CM | POA: Diagnosis not present

## 2016-02-06 DIAGNOSIS — I10 Essential (primary) hypertension: Secondary | ICD-10-CM | POA: Diagnosis not present

## 2016-02-06 DIAGNOSIS — J309 Allergic rhinitis, unspecified: Secondary | ICD-10-CM | POA: Diagnosis not present

## 2016-02-06 DIAGNOSIS — N952 Postmenopausal atrophic vaginitis: Secondary | ICD-10-CM | POA: Diagnosis not present

## 2016-02-06 DIAGNOSIS — M2450 Contracture, unspecified joint: Secondary | ICD-10-CM | POA: Diagnosis not present

## 2016-02-06 DIAGNOSIS — E46 Unspecified protein-calorie malnutrition: Secondary | ICD-10-CM | POA: Diagnosis not present

## 2016-02-06 DIAGNOSIS — H3530 Unspecified macular degeneration: Secondary | ICD-10-CM | POA: Diagnosis not present

## 2016-02-06 DIAGNOSIS — G1221 Amyotrophic lateral sclerosis: Secondary | ICD-10-CM | POA: Diagnosis not present

## 2016-02-06 DIAGNOSIS — R0600 Dyspnea, unspecified: Secondary | ICD-10-CM | POA: Diagnosis not present

## 2016-02-07 DIAGNOSIS — C4490 Unspecified malignant neoplasm of skin, unspecified: Secondary | ICD-10-CM | POA: Diagnosis not present

## 2016-02-07 DIAGNOSIS — E46 Unspecified protein-calorie malnutrition: Secondary | ICD-10-CM | POA: Diagnosis not present

## 2016-02-07 DIAGNOSIS — M2450 Contracture, unspecified joint: Secondary | ICD-10-CM | POA: Diagnosis not present

## 2016-02-07 DIAGNOSIS — G1221 Amyotrophic lateral sclerosis: Secondary | ICD-10-CM | POA: Diagnosis not present

## 2016-02-07 DIAGNOSIS — N3281 Overactive bladder: Secondary | ICD-10-CM | POA: Diagnosis not present

## 2016-02-07 DIAGNOSIS — J309 Allergic rhinitis, unspecified: Secondary | ICD-10-CM | POA: Diagnosis not present

## 2016-02-07 DIAGNOSIS — R471 Dysarthria and anarthria: Secondary | ICD-10-CM | POA: Diagnosis not present

## 2016-02-07 DIAGNOSIS — H3530 Unspecified macular degeneration: Secondary | ICD-10-CM | POA: Diagnosis not present

## 2016-02-07 DIAGNOSIS — N952 Postmenopausal atrophic vaginitis: Secondary | ICD-10-CM | POA: Diagnosis not present

## 2016-02-07 DIAGNOSIS — R1310 Dysphagia, unspecified: Secondary | ICD-10-CM | POA: Diagnosis not present

## 2016-02-07 DIAGNOSIS — I10 Essential (primary) hypertension: Secondary | ICD-10-CM | POA: Diagnosis not present

## 2016-02-07 DIAGNOSIS — R0600 Dyspnea, unspecified: Secondary | ICD-10-CM | POA: Diagnosis not present

## 2016-02-07 DIAGNOSIS — E785 Hyperlipidemia, unspecified: Secondary | ICD-10-CM | POA: Diagnosis not present

## 2016-02-08 DIAGNOSIS — C4490 Unspecified malignant neoplasm of skin, unspecified: Secondary | ICD-10-CM | POA: Diagnosis not present

## 2016-02-08 DIAGNOSIS — N952 Postmenopausal atrophic vaginitis: Secondary | ICD-10-CM | POA: Diagnosis not present

## 2016-02-08 DIAGNOSIS — I10 Essential (primary) hypertension: Secondary | ICD-10-CM | POA: Diagnosis not present

## 2016-02-08 DIAGNOSIS — N3281 Overactive bladder: Secondary | ICD-10-CM | POA: Diagnosis not present

## 2016-02-08 DIAGNOSIS — H3530 Unspecified macular degeneration: Secondary | ICD-10-CM | POA: Diagnosis not present

## 2016-02-08 DIAGNOSIS — J309 Allergic rhinitis, unspecified: Secondary | ICD-10-CM | POA: Diagnosis not present

## 2016-02-08 DIAGNOSIS — E46 Unspecified protein-calorie malnutrition: Secondary | ICD-10-CM | POA: Diagnosis not present

## 2016-02-08 DIAGNOSIS — R471 Dysarthria and anarthria: Secondary | ICD-10-CM | POA: Diagnosis not present

## 2016-02-08 DIAGNOSIS — R1310 Dysphagia, unspecified: Secondary | ICD-10-CM | POA: Diagnosis not present

## 2016-02-08 DIAGNOSIS — G1221 Amyotrophic lateral sclerosis: Secondary | ICD-10-CM | POA: Diagnosis not present

## 2016-02-08 DIAGNOSIS — M2450 Contracture, unspecified joint: Secondary | ICD-10-CM | POA: Diagnosis not present

## 2016-02-08 DIAGNOSIS — E785 Hyperlipidemia, unspecified: Secondary | ICD-10-CM | POA: Diagnosis not present

## 2016-02-08 DIAGNOSIS — R0600 Dyspnea, unspecified: Secondary | ICD-10-CM | POA: Diagnosis not present

## 2016-02-09 DIAGNOSIS — I10 Essential (primary) hypertension: Secondary | ICD-10-CM | POA: Diagnosis not present

## 2016-02-09 DIAGNOSIS — E46 Unspecified protein-calorie malnutrition: Secondary | ICD-10-CM | POA: Diagnosis not present

## 2016-02-09 DIAGNOSIS — N952 Postmenopausal atrophic vaginitis: Secondary | ICD-10-CM | POA: Diagnosis not present

## 2016-02-09 DIAGNOSIS — R471 Dysarthria and anarthria: Secondary | ICD-10-CM | POA: Diagnosis not present

## 2016-02-09 DIAGNOSIS — E785 Hyperlipidemia, unspecified: Secondary | ICD-10-CM | POA: Diagnosis not present

## 2016-02-09 DIAGNOSIS — N3281 Overactive bladder: Secondary | ICD-10-CM | POA: Diagnosis not present

## 2016-02-09 DIAGNOSIS — G1221 Amyotrophic lateral sclerosis: Secondary | ICD-10-CM | POA: Diagnosis not present

## 2016-02-09 DIAGNOSIS — R1310 Dysphagia, unspecified: Secondary | ICD-10-CM | POA: Diagnosis not present

## 2016-02-09 DIAGNOSIS — C4490 Unspecified malignant neoplasm of skin, unspecified: Secondary | ICD-10-CM | POA: Diagnosis not present

## 2016-02-09 DIAGNOSIS — J309 Allergic rhinitis, unspecified: Secondary | ICD-10-CM | POA: Diagnosis not present

## 2016-02-09 DIAGNOSIS — R0600 Dyspnea, unspecified: Secondary | ICD-10-CM | POA: Diagnosis not present

## 2016-02-09 DIAGNOSIS — H3530 Unspecified macular degeneration: Secondary | ICD-10-CM | POA: Diagnosis not present

## 2016-02-09 DIAGNOSIS — M2450 Contracture, unspecified joint: Secondary | ICD-10-CM | POA: Diagnosis not present

## 2016-02-10 DIAGNOSIS — N952 Postmenopausal atrophic vaginitis: Secondary | ICD-10-CM | POA: Diagnosis not present

## 2016-02-10 DIAGNOSIS — M2450 Contracture, unspecified joint: Secondary | ICD-10-CM | POA: Diagnosis not present

## 2016-02-10 DIAGNOSIS — C4490 Unspecified malignant neoplasm of skin, unspecified: Secondary | ICD-10-CM | POA: Diagnosis not present

## 2016-02-10 DIAGNOSIS — R1310 Dysphagia, unspecified: Secondary | ICD-10-CM | POA: Diagnosis not present

## 2016-02-10 DIAGNOSIS — E46 Unspecified protein-calorie malnutrition: Secondary | ICD-10-CM | POA: Diagnosis not present

## 2016-02-10 DIAGNOSIS — H3530 Unspecified macular degeneration: Secondary | ICD-10-CM | POA: Diagnosis not present

## 2016-02-10 DIAGNOSIS — I10 Essential (primary) hypertension: Secondary | ICD-10-CM | POA: Diagnosis not present

## 2016-02-10 DIAGNOSIS — N3281 Overactive bladder: Secondary | ICD-10-CM | POA: Diagnosis not present

## 2016-02-10 DIAGNOSIS — R471 Dysarthria and anarthria: Secondary | ICD-10-CM | POA: Diagnosis not present

## 2016-02-10 DIAGNOSIS — G1221 Amyotrophic lateral sclerosis: Secondary | ICD-10-CM | POA: Diagnosis not present

## 2016-02-10 DIAGNOSIS — E785 Hyperlipidemia, unspecified: Secondary | ICD-10-CM | POA: Diagnosis not present

## 2016-02-10 DIAGNOSIS — J309 Allergic rhinitis, unspecified: Secondary | ICD-10-CM | POA: Diagnosis not present

## 2016-02-10 DIAGNOSIS — R0600 Dyspnea, unspecified: Secondary | ICD-10-CM | POA: Diagnosis not present

## 2016-02-11 DIAGNOSIS — I10 Essential (primary) hypertension: Secondary | ICD-10-CM | POA: Diagnosis not present

## 2016-02-11 DIAGNOSIS — C4490 Unspecified malignant neoplasm of skin, unspecified: Secondary | ICD-10-CM | POA: Diagnosis not present

## 2016-02-11 DIAGNOSIS — R1310 Dysphagia, unspecified: Secondary | ICD-10-CM | POA: Diagnosis not present

## 2016-02-11 DIAGNOSIS — R0600 Dyspnea, unspecified: Secondary | ICD-10-CM | POA: Diagnosis not present

## 2016-02-11 DIAGNOSIS — J309 Allergic rhinitis, unspecified: Secondary | ICD-10-CM | POA: Diagnosis not present

## 2016-02-11 DIAGNOSIS — E785 Hyperlipidemia, unspecified: Secondary | ICD-10-CM | POA: Diagnosis not present

## 2016-02-11 DIAGNOSIS — M2450 Contracture, unspecified joint: Secondary | ICD-10-CM | POA: Diagnosis not present

## 2016-02-11 DIAGNOSIS — G1221 Amyotrophic lateral sclerosis: Secondary | ICD-10-CM | POA: Diagnosis not present

## 2016-02-11 DIAGNOSIS — N3281 Overactive bladder: Secondary | ICD-10-CM | POA: Diagnosis not present

## 2016-02-11 DIAGNOSIS — H3530 Unspecified macular degeneration: Secondary | ICD-10-CM | POA: Diagnosis not present

## 2016-02-11 DIAGNOSIS — N952 Postmenopausal atrophic vaginitis: Secondary | ICD-10-CM | POA: Diagnosis not present

## 2016-02-11 DIAGNOSIS — E46 Unspecified protein-calorie malnutrition: Secondary | ICD-10-CM | POA: Diagnosis not present

## 2016-02-11 DIAGNOSIS — R471 Dysarthria and anarthria: Secondary | ICD-10-CM | POA: Diagnosis not present

## 2016-02-12 DIAGNOSIS — M2450 Contracture, unspecified joint: Secondary | ICD-10-CM | POA: Diagnosis not present

## 2016-02-12 DIAGNOSIS — R471 Dysarthria and anarthria: Secondary | ICD-10-CM | POA: Diagnosis not present

## 2016-02-12 DIAGNOSIS — N952 Postmenopausal atrophic vaginitis: Secondary | ICD-10-CM | POA: Diagnosis not present

## 2016-02-12 DIAGNOSIS — I10 Essential (primary) hypertension: Secondary | ICD-10-CM | POA: Diagnosis not present

## 2016-02-12 DIAGNOSIS — G1221 Amyotrophic lateral sclerosis: Secondary | ICD-10-CM | POA: Diagnosis not present

## 2016-02-12 DIAGNOSIS — H3530 Unspecified macular degeneration: Secondary | ICD-10-CM | POA: Diagnosis not present

## 2016-02-12 DIAGNOSIS — N3281 Overactive bladder: Secondary | ICD-10-CM | POA: Diagnosis not present

## 2016-02-12 DIAGNOSIS — R1310 Dysphagia, unspecified: Secondary | ICD-10-CM | POA: Diagnosis not present

## 2016-02-12 DIAGNOSIS — J309 Allergic rhinitis, unspecified: Secondary | ICD-10-CM | POA: Diagnosis not present

## 2016-02-12 DIAGNOSIS — E785 Hyperlipidemia, unspecified: Secondary | ICD-10-CM | POA: Diagnosis not present

## 2016-02-12 DIAGNOSIS — R0600 Dyspnea, unspecified: Secondary | ICD-10-CM | POA: Diagnosis not present

## 2016-02-12 DIAGNOSIS — E46 Unspecified protein-calorie malnutrition: Secondary | ICD-10-CM | POA: Diagnosis not present

## 2016-02-12 DIAGNOSIS — C4490 Unspecified malignant neoplasm of skin, unspecified: Secondary | ICD-10-CM | POA: Diagnosis not present

## 2016-02-13 DIAGNOSIS — E785 Hyperlipidemia, unspecified: Secondary | ICD-10-CM | POA: Diagnosis not present

## 2016-02-13 DIAGNOSIS — E46 Unspecified protein-calorie malnutrition: Secondary | ICD-10-CM | POA: Diagnosis not present

## 2016-02-13 DIAGNOSIS — R1310 Dysphagia, unspecified: Secondary | ICD-10-CM | POA: Diagnosis not present

## 2016-02-13 DIAGNOSIS — G1221 Amyotrophic lateral sclerosis: Secondary | ICD-10-CM | POA: Diagnosis not present

## 2016-02-13 DIAGNOSIS — R0600 Dyspnea, unspecified: Secondary | ICD-10-CM | POA: Diagnosis not present

## 2016-02-13 DIAGNOSIS — H3530 Unspecified macular degeneration: Secondary | ICD-10-CM | POA: Diagnosis not present

## 2016-02-13 DIAGNOSIS — M2450 Contracture, unspecified joint: Secondary | ICD-10-CM | POA: Diagnosis not present

## 2016-02-13 DIAGNOSIS — R471 Dysarthria and anarthria: Secondary | ICD-10-CM | POA: Diagnosis not present

## 2016-02-13 DIAGNOSIS — J309 Allergic rhinitis, unspecified: Secondary | ICD-10-CM | POA: Diagnosis not present

## 2016-02-13 DIAGNOSIS — I10 Essential (primary) hypertension: Secondary | ICD-10-CM | POA: Diagnosis not present

## 2016-02-13 DIAGNOSIS — N952 Postmenopausal atrophic vaginitis: Secondary | ICD-10-CM | POA: Diagnosis not present

## 2016-02-13 DIAGNOSIS — N3281 Overactive bladder: Secondary | ICD-10-CM | POA: Diagnosis not present

## 2016-02-13 DIAGNOSIS — C4490 Unspecified malignant neoplasm of skin, unspecified: Secondary | ICD-10-CM | POA: Diagnosis not present

## 2016-02-14 DIAGNOSIS — E785 Hyperlipidemia, unspecified: Secondary | ICD-10-CM | POA: Diagnosis not present

## 2016-02-14 DIAGNOSIS — M2450 Contracture, unspecified joint: Secondary | ICD-10-CM | POA: Diagnosis not present

## 2016-02-14 DIAGNOSIS — R471 Dysarthria and anarthria: Secondary | ICD-10-CM | POA: Diagnosis not present

## 2016-02-14 DIAGNOSIS — H3530 Unspecified macular degeneration: Secondary | ICD-10-CM | POA: Diagnosis not present

## 2016-02-14 DIAGNOSIS — R1310 Dysphagia, unspecified: Secondary | ICD-10-CM | POA: Diagnosis not present

## 2016-02-14 DIAGNOSIS — N952 Postmenopausal atrophic vaginitis: Secondary | ICD-10-CM | POA: Diagnosis not present

## 2016-02-14 DIAGNOSIS — R0600 Dyspnea, unspecified: Secondary | ICD-10-CM | POA: Diagnosis not present

## 2016-02-14 DIAGNOSIS — J309 Allergic rhinitis, unspecified: Secondary | ICD-10-CM | POA: Diagnosis not present

## 2016-02-14 DIAGNOSIS — C4490 Unspecified malignant neoplasm of skin, unspecified: Secondary | ICD-10-CM | POA: Diagnosis not present

## 2016-02-14 DIAGNOSIS — E46 Unspecified protein-calorie malnutrition: Secondary | ICD-10-CM | POA: Diagnosis not present

## 2016-02-14 DIAGNOSIS — I10 Essential (primary) hypertension: Secondary | ICD-10-CM | POA: Diagnosis not present

## 2016-02-14 DIAGNOSIS — N3281 Overactive bladder: Secondary | ICD-10-CM | POA: Diagnosis not present

## 2016-02-14 DIAGNOSIS — G1221 Amyotrophic lateral sclerosis: Secondary | ICD-10-CM | POA: Diagnosis not present

## 2016-02-15 DIAGNOSIS — N3281 Overactive bladder: Secondary | ICD-10-CM | POA: Diagnosis not present

## 2016-02-15 DIAGNOSIS — G1221 Amyotrophic lateral sclerosis: Secondary | ICD-10-CM | POA: Diagnosis not present

## 2016-02-15 DIAGNOSIS — E785 Hyperlipidemia, unspecified: Secondary | ICD-10-CM | POA: Diagnosis not present

## 2016-02-15 DIAGNOSIS — N952 Postmenopausal atrophic vaginitis: Secondary | ICD-10-CM | POA: Diagnosis not present

## 2016-02-15 DIAGNOSIS — R1310 Dysphagia, unspecified: Secondary | ICD-10-CM | POA: Diagnosis not present

## 2016-02-15 DIAGNOSIS — R0600 Dyspnea, unspecified: Secondary | ICD-10-CM | POA: Diagnosis not present

## 2016-02-15 DIAGNOSIS — E46 Unspecified protein-calorie malnutrition: Secondary | ICD-10-CM | POA: Diagnosis not present

## 2016-02-15 DIAGNOSIS — I10 Essential (primary) hypertension: Secondary | ICD-10-CM | POA: Diagnosis not present

## 2016-02-15 DIAGNOSIS — M2450 Contracture, unspecified joint: Secondary | ICD-10-CM | POA: Diagnosis not present

## 2016-02-15 DIAGNOSIS — H3530 Unspecified macular degeneration: Secondary | ICD-10-CM | POA: Diagnosis not present

## 2016-02-15 DIAGNOSIS — C4490 Unspecified malignant neoplasm of skin, unspecified: Secondary | ICD-10-CM | POA: Diagnosis not present

## 2016-02-15 DIAGNOSIS — J309 Allergic rhinitis, unspecified: Secondary | ICD-10-CM | POA: Diagnosis not present

## 2016-02-15 DIAGNOSIS — R471 Dysarthria and anarthria: Secondary | ICD-10-CM | POA: Diagnosis not present

## 2016-02-16 DIAGNOSIS — R471 Dysarthria and anarthria: Secondary | ICD-10-CM | POA: Diagnosis not present

## 2016-02-16 DIAGNOSIS — H3530 Unspecified macular degeneration: Secondary | ICD-10-CM | POA: Diagnosis not present

## 2016-02-16 DIAGNOSIS — E46 Unspecified protein-calorie malnutrition: Secondary | ICD-10-CM | POA: Diagnosis not present

## 2016-02-16 DIAGNOSIS — R0600 Dyspnea, unspecified: Secondary | ICD-10-CM | POA: Diagnosis not present

## 2016-02-16 DIAGNOSIS — N952 Postmenopausal atrophic vaginitis: Secondary | ICD-10-CM | POA: Diagnosis not present

## 2016-02-16 DIAGNOSIS — M2450 Contracture, unspecified joint: Secondary | ICD-10-CM | POA: Diagnosis not present

## 2016-02-16 DIAGNOSIS — E785 Hyperlipidemia, unspecified: Secondary | ICD-10-CM | POA: Diagnosis not present

## 2016-02-16 DIAGNOSIS — I10 Essential (primary) hypertension: Secondary | ICD-10-CM | POA: Diagnosis not present

## 2016-02-16 DIAGNOSIS — J309 Allergic rhinitis, unspecified: Secondary | ICD-10-CM | POA: Diagnosis not present

## 2016-02-16 DIAGNOSIS — N3281 Overactive bladder: Secondary | ICD-10-CM | POA: Diagnosis not present

## 2016-02-16 DIAGNOSIS — C4490 Unspecified malignant neoplasm of skin, unspecified: Secondary | ICD-10-CM | POA: Diagnosis not present

## 2016-02-16 DIAGNOSIS — R1310 Dysphagia, unspecified: Secondary | ICD-10-CM | POA: Diagnosis not present

## 2016-02-16 DIAGNOSIS — G1221 Amyotrophic lateral sclerosis: Secondary | ICD-10-CM | POA: Diagnosis not present

## 2016-02-17 DIAGNOSIS — M2450 Contracture, unspecified joint: Secondary | ICD-10-CM | POA: Diagnosis not present

## 2016-02-17 DIAGNOSIS — R471 Dysarthria and anarthria: Secondary | ICD-10-CM | POA: Diagnosis not present

## 2016-02-17 DIAGNOSIS — H3530 Unspecified macular degeneration: Secondary | ICD-10-CM | POA: Diagnosis not present

## 2016-02-17 DIAGNOSIS — E46 Unspecified protein-calorie malnutrition: Secondary | ICD-10-CM | POA: Diagnosis not present

## 2016-02-17 DIAGNOSIS — I10 Essential (primary) hypertension: Secondary | ICD-10-CM | POA: Diagnosis not present

## 2016-02-17 DIAGNOSIS — N3281 Overactive bladder: Secondary | ICD-10-CM | POA: Diagnosis not present

## 2016-02-17 DIAGNOSIS — C4490 Unspecified malignant neoplasm of skin, unspecified: Secondary | ICD-10-CM | POA: Diagnosis not present

## 2016-02-17 DIAGNOSIS — R0600 Dyspnea, unspecified: Secondary | ICD-10-CM | POA: Diagnosis not present

## 2016-02-17 DIAGNOSIS — N952 Postmenopausal atrophic vaginitis: Secondary | ICD-10-CM | POA: Diagnosis not present

## 2016-02-17 DIAGNOSIS — J309 Allergic rhinitis, unspecified: Secondary | ICD-10-CM | POA: Diagnosis not present

## 2016-02-17 DIAGNOSIS — G1221 Amyotrophic lateral sclerosis: Secondary | ICD-10-CM | POA: Diagnosis not present

## 2016-02-17 DIAGNOSIS — E785 Hyperlipidemia, unspecified: Secondary | ICD-10-CM | POA: Diagnosis not present

## 2016-02-17 DIAGNOSIS — R1310 Dysphagia, unspecified: Secondary | ICD-10-CM | POA: Diagnosis not present

## 2016-02-18 DIAGNOSIS — N3281 Overactive bladder: Secondary | ICD-10-CM | POA: Diagnosis not present

## 2016-02-18 DIAGNOSIS — H3530 Unspecified macular degeneration: Secondary | ICD-10-CM | POA: Diagnosis not present

## 2016-02-18 DIAGNOSIS — I10 Essential (primary) hypertension: Secondary | ICD-10-CM | POA: Diagnosis not present

## 2016-02-18 DIAGNOSIS — G1221 Amyotrophic lateral sclerosis: Secondary | ICD-10-CM | POA: Diagnosis not present

## 2016-02-18 DIAGNOSIS — E785 Hyperlipidemia, unspecified: Secondary | ICD-10-CM | POA: Diagnosis not present

## 2016-02-18 DIAGNOSIS — N952 Postmenopausal atrophic vaginitis: Secondary | ICD-10-CM | POA: Diagnosis not present

## 2016-02-18 DIAGNOSIS — R471 Dysarthria and anarthria: Secondary | ICD-10-CM | POA: Diagnosis not present

## 2016-02-18 DIAGNOSIS — R1310 Dysphagia, unspecified: Secondary | ICD-10-CM | POA: Diagnosis not present

## 2016-02-18 DIAGNOSIS — J309 Allergic rhinitis, unspecified: Secondary | ICD-10-CM | POA: Diagnosis not present

## 2016-02-18 DIAGNOSIS — E46 Unspecified protein-calorie malnutrition: Secondary | ICD-10-CM | POA: Diagnosis not present

## 2016-02-18 DIAGNOSIS — C4490 Unspecified malignant neoplasm of skin, unspecified: Secondary | ICD-10-CM | POA: Diagnosis not present

## 2016-02-18 DIAGNOSIS — R0600 Dyspnea, unspecified: Secondary | ICD-10-CM | POA: Diagnosis not present

## 2016-02-18 DIAGNOSIS — M2450 Contracture, unspecified joint: Secondary | ICD-10-CM | POA: Diagnosis not present

## 2016-02-19 DIAGNOSIS — G1221 Amyotrophic lateral sclerosis: Secondary | ICD-10-CM | POA: Diagnosis not present

## 2016-02-19 DIAGNOSIS — R471 Dysarthria and anarthria: Secondary | ICD-10-CM | POA: Diagnosis not present

## 2016-02-19 DIAGNOSIS — C4490 Unspecified malignant neoplasm of skin, unspecified: Secondary | ICD-10-CM | POA: Diagnosis not present

## 2016-02-19 DIAGNOSIS — N952 Postmenopausal atrophic vaginitis: Secondary | ICD-10-CM | POA: Diagnosis not present

## 2016-02-19 DIAGNOSIS — I10 Essential (primary) hypertension: Secondary | ICD-10-CM | POA: Diagnosis not present

## 2016-02-19 DIAGNOSIS — H3530 Unspecified macular degeneration: Secondary | ICD-10-CM | POA: Diagnosis not present

## 2016-02-19 DIAGNOSIS — E46 Unspecified protein-calorie malnutrition: Secondary | ICD-10-CM | POA: Diagnosis not present

## 2016-02-19 DIAGNOSIS — E785 Hyperlipidemia, unspecified: Secondary | ICD-10-CM | POA: Diagnosis not present

## 2016-02-19 DIAGNOSIS — R1310 Dysphagia, unspecified: Secondary | ICD-10-CM | POA: Diagnosis not present

## 2016-02-19 DIAGNOSIS — N3281 Overactive bladder: Secondary | ICD-10-CM | POA: Diagnosis not present

## 2016-02-19 DIAGNOSIS — R0600 Dyspnea, unspecified: Secondary | ICD-10-CM | POA: Diagnosis not present

## 2016-02-19 DIAGNOSIS — J309 Allergic rhinitis, unspecified: Secondary | ICD-10-CM | POA: Diagnosis not present

## 2016-02-19 DIAGNOSIS — M2450 Contracture, unspecified joint: Secondary | ICD-10-CM | POA: Diagnosis not present

## 2016-02-20 DIAGNOSIS — R471 Dysarthria and anarthria: Secondary | ICD-10-CM | POA: Diagnosis not present

## 2016-02-20 DIAGNOSIS — R0600 Dyspnea, unspecified: Secondary | ICD-10-CM | POA: Diagnosis not present

## 2016-02-20 DIAGNOSIS — E46 Unspecified protein-calorie malnutrition: Secondary | ICD-10-CM | POA: Diagnosis not present

## 2016-02-20 DIAGNOSIS — R1310 Dysphagia, unspecified: Secondary | ICD-10-CM | POA: Diagnosis not present

## 2016-02-20 DIAGNOSIS — I10 Essential (primary) hypertension: Secondary | ICD-10-CM | POA: Diagnosis not present

## 2016-02-20 DIAGNOSIS — N3281 Overactive bladder: Secondary | ICD-10-CM | POA: Diagnosis not present

## 2016-02-20 DIAGNOSIS — M2450 Contracture, unspecified joint: Secondary | ICD-10-CM | POA: Diagnosis not present

## 2016-02-20 DIAGNOSIS — H3530 Unspecified macular degeneration: Secondary | ICD-10-CM | POA: Diagnosis not present

## 2016-02-20 DIAGNOSIS — C4490 Unspecified malignant neoplasm of skin, unspecified: Secondary | ICD-10-CM | POA: Diagnosis not present

## 2016-02-20 DIAGNOSIS — J309 Allergic rhinitis, unspecified: Secondary | ICD-10-CM | POA: Diagnosis not present

## 2016-02-20 DIAGNOSIS — E785 Hyperlipidemia, unspecified: Secondary | ICD-10-CM | POA: Diagnosis not present

## 2016-02-20 DIAGNOSIS — N952 Postmenopausal atrophic vaginitis: Secondary | ICD-10-CM | POA: Diagnosis not present

## 2016-02-20 DIAGNOSIS — G1221 Amyotrophic lateral sclerosis: Secondary | ICD-10-CM | POA: Diagnosis not present

## 2016-02-21 ENCOUNTER — Non-Acute Institutional Stay (SKILLED_NURSING_FACILITY): Payer: Medicare Other | Admitting: Adult Health

## 2016-02-21 ENCOUNTER — Encounter: Payer: Self-pay | Admitting: Adult Health

## 2016-02-21 DIAGNOSIS — N952 Postmenopausal atrophic vaginitis: Secondary | ICD-10-CM

## 2016-02-21 DIAGNOSIS — F419 Anxiety disorder, unspecified: Secondary | ICD-10-CM

## 2016-02-21 DIAGNOSIS — I10 Essential (primary) hypertension: Secondary | ICD-10-CM | POA: Diagnosis not present

## 2016-02-21 DIAGNOSIS — H04123 Dry eye syndrome of bilateral lacrimal glands: Secondary | ICD-10-CM | POA: Diagnosis not present

## 2016-02-21 DIAGNOSIS — J309 Allergic rhinitis, unspecified: Secondary | ICD-10-CM | POA: Diagnosis not present

## 2016-02-21 DIAGNOSIS — G1221 Amyotrophic lateral sclerosis: Secondary | ICD-10-CM

## 2016-02-21 DIAGNOSIS — G4700 Insomnia, unspecified: Secondary | ICD-10-CM | POA: Diagnosis not present

## 2016-02-21 DIAGNOSIS — K219 Gastro-esophageal reflux disease without esophagitis: Secondary | ICD-10-CM

## 2016-02-21 DIAGNOSIS — M2450 Contracture, unspecified joint: Secondary | ICD-10-CM | POA: Diagnosis not present

## 2016-02-21 DIAGNOSIS — E46 Unspecified protein-calorie malnutrition: Secondary | ICD-10-CM | POA: Diagnosis not present

## 2016-02-21 DIAGNOSIS — H3530 Unspecified macular degeneration: Secondary | ICD-10-CM | POA: Diagnosis not present

## 2016-02-21 DIAGNOSIS — G629 Polyneuropathy, unspecified: Secondary | ICD-10-CM

## 2016-02-21 DIAGNOSIS — R0600 Dyspnea, unspecified: Secondary | ICD-10-CM | POA: Diagnosis not present

## 2016-02-21 DIAGNOSIS — S82291S Other fracture of shaft of right tibia, sequela: Secondary | ICD-10-CM

## 2016-02-21 DIAGNOSIS — R1310 Dysphagia, unspecified: Secondary | ICD-10-CM | POA: Diagnosis not present

## 2016-02-21 DIAGNOSIS — N3281 Overactive bladder: Secondary | ICD-10-CM | POA: Diagnosis not present

## 2016-02-21 DIAGNOSIS — C4490 Unspecified malignant neoplasm of skin, unspecified: Secondary | ICD-10-CM | POA: Diagnosis not present

## 2016-02-21 DIAGNOSIS — E785 Hyperlipidemia, unspecified: Secondary | ICD-10-CM | POA: Diagnosis not present

## 2016-02-21 DIAGNOSIS — R471 Dysarthria and anarthria: Secondary | ICD-10-CM | POA: Diagnosis not present

## 2016-02-21 DIAGNOSIS — Z79899 Other long term (current) drug therapy: Secondary | ICD-10-CM | POA: Diagnosis not present

## 2016-02-21 DIAGNOSIS — K5900 Constipation, unspecified: Secondary | ICD-10-CM | POA: Diagnosis not present

## 2016-02-21 NOTE — Progress Notes (Signed)
Patient ID: Ashley Vaughan, female   DOB: March 02, 1929, 82 y.o.   MRN: [id number]    DATE:    02/21/16  MRN:  [id number]  BIRTHDAY: 12-29-1928  Facility:  Nursing Home Location:  [facility] Room Number: 406-A  LEVEL OF CARE:  SNF [phone])  Contact Information    Name Relation Home Work [place] Son [phone]  [place] Daughter [phone]        Code Status History    Date Active Date Inactive Code Status Order ID Comments User Context   11/30/2014  4:38 PM 12/03/2014  8:34 PM Full Code [id number]  Wylene Simmer, MD ED    Advance Directive Documentation        Most Recent Value   Type of Advance Directive  Out of facility DNR (pink MOST or yellow form)   Pre-existing out of facility DNR order (yellow form or pink MOST form)     "MOST" Form in Place?         Chief Complaint  Patient presents with  . Medical Management of Chronic Issues    HISTORY OF PRESENT ILLNESS:   This is an 82 year old female who is a long-term resident @ [facility]. She is being seen today for a routine visit. She has complained of dysuria and there is a pending UA CS. Her BPs has been stable -  149/68, 139/74, 110/48, 123/52 and 128/68. She is followed-up by hospice/comfort care.   PAST MEDICAL HISTORY:  Past Medical History:  Diagnosis Date  . Allergy    s/p immunotherapy  . ALS (amyotrophic lateral sclerosis) ([place])   . Anxiety   . Atrophic vaginitis   . B12 deficiency   . BCC (basal cell carcinoma of skin) L cheek-DrLupton (6/09)  . Closed right tibial fracture   . Constipation   . Diverticulosis (L s[place]) 1994  . GERD (gastroesophageal reflux disease)    Esophagitis presence not specified  . Hearing loss    right ear  . Hematuria, microscopic DrKimbrough 1993  . Hiatal hernia   . Hyperlipidemia   . Hypertension   . Insomnia   . Kidney stones, calcium oxalate 1993  . Macular degeneration   . Migraine  (atenolol for prophylaxis)  . Mitral regurgitation    mild-mod; mild aortic regurgitation, and mild-mod TR  . Neuropathy ([place])   . OAB (overactive bladder)   . Osteoporosis   . Postmenopausal bleeding 2010-DrCole  . Postmenopausal HRT (hormone replacement therapy)   . Pulmonary hypertension (HCC) mild,mild AR,mild-mod MR,mild-mod TR     CURRENT MEDICATIONS: Reviewed  Patient's Medications  New Prescriptions   No medications on file  Previous Medications   ACETAMINOPHEN (TYLENOL) 325 MG TABLET    Take 650 mg by mouth every 4 (four) hours as needed for mild pain or fever (Fever >100).   AMLODIPINE (NORVASC) 5 MG TABLET    Take 1 tablet (5 mg total) by mouth daily.   ASPIRIN 81 MG CHEWABLE TABLET    Chew 81 mg by mouth at bedtime.   CARBOXYMETHYLCELLULOSE SODIUM (REFRESH LIQUIGEL OP)    Apply 1 drop to eye 4 (four) times daily. OU    CONJUGATED ESTROGENS (PREMARIN) VAGINAL CREAM    Place 1 Applicatorful vaginally 2 (two) times a week.   CYANOCOBALAMIN (B-12) 5000 MCG SUBL    Place 1 tablet under the tongue daily.    GABAPENTIN (NEURONTIN) 100 MG CAPSULE    Take 200  mg by mouth at bedtime. Give two (2) 100 mg tablets to = 200 mg QHS   HYDROCODONE-ACETAMINOPHEN (NORCO/VICODIN) 5-325 MG TABLET    Take 1 tablet by mouth every 4 (four) hours as needed for moderate pain.   HYDROCODONE-ACETAMINOPHEN (NORCO/VICODIN) 5-325 MG TABLET    Take 1 tablet by mouth at bedtime. Do not exceed 4 gm of Tylenol in 24 hours   LOPERAMIDE (IMODIUM) 2 MG CAPSULE    Take 2-4 mg by mouth as needed for diarrhea or loose stools. 2 caps to = 4 mg initially, followed by 2 mg after each episode of lose stool.  Not to exceed 16 mg in a 24-hour period.   LORAZEPAM (ATIVAN) 0.5 MG TABLET    Take 1 tablet by mouth every night at bedtime for anxiety   MELATONIN 5 MG TABS    Take 5 mg by mouth at bedtime.   MIRABEGRON ER (MYRBETRIQ) 25 MG TB24 TABLET    Take 25 mg by mouth at bedtime.    MULTIPLE VITAMINS-MINERALS (OCUVITE EYE  + MULTI) TABS    Take 2 tablets by mouth daily.    NYSTATIN (NYSTATIN) POWDER    Apply topically 2 (two) times daily. Apply to groin and inner thigh BID   OMEPRAZOLE (PRILOSEC) 20 MG CAPSULE    Take 20 mg by mouth daily.    POLYETHYLENE GLYCOL (MIRALAX / GLYCOLAX) PACKET    Take 17 g by mouth daily as needed for mild constipation or moderate constipation.   SODIUM CHLORIDE (OCEAN) 0.65 % SOLN NASAL SPRAY    Place 1-2 sprays into both nostrils as needed for congestion.    UNABLE TO FIND    Med Name: Med Pass 90 mL by mouth twice daily for nutritional support  Modified Medications   No medications on file  Discontinued Medications   No medications on file     Allergies  Allergen Reactions  . Codeine Other (See Comments)    unknown  . Lexapro [Escitalopram] Nausea Only  . Macrobid [Nitrofurantoin Monohydrate Macrocrystals] Nausea Only    Nausea and headache  . Sulfa Antibiotics Other (See Comments)    dizziness     REVIEW OF SYSTEMS:  GENERAL: no change in appetite, no fatigue, no weight changes, no fever, chills or weakness EYES: Denies change in vision, dry eyes, eye pain, itching or discharge EARS: Denies change in hearing, ringing in ears, or earache NOSE: Denies nasal congestion or epistaxis MOUTH and THROAT: Denies oral discomfort, gingival pain or bleeding, pain from teeth or hoarseness   RESPIRATORY: no cough, SOB, DOE, wheezing, hemoptysis CARDIAC: no chest pain, edema or palpitations GI: no abdominal pain, diarrhea, constipation, heart burn, nausea or vomiting PSYCHIATRIC: Denies feeling of depression or anxiety. No report of hallucinations, insomnia, paranoia, or agitation   PHYSICAL EXAMINATION  GENERAL APPEARANCE: Well nourished. In no acute distress. Normal body habitus SKIN:  Skin is warm and dry.  HEAD: Normal in size and contour. No evidence of trauma EYES: Lids open and close normally. No blepharitis, entropion or ectropion. PERRL. Conjunctivae are clear and  sclerae are white. Lenses are without opacity EARS: Pinnae are normal. Patient hears normal voice tunes of the examiner MOUTH and THROAT: Lips are without lesions. Oral mucosa is moist and without lesions. Tongue is normal in shape, size, and color and without lesions NECK: supple, trachea midline, no neck masses, no thyroid tenderness, no thyromegaly LYMPHATICS: no LAN in the neck, no supraclavicular LAN RESPIRATORY: breathing is even & unlabored, BS CTAB  CARDIAC: RRR, no murmur,no extra heart sounds, no edema GI: abdomen soft, normal BS, no masses, no tenderness, no hepatomegaly, no splenomegaly EXTREMITIES:  Able to move X 4 extremities; generalized weakness BLE; contracted fingers on bilateral hands  NEURO:  Slurred speech PSYCHIATRIC: Alert and oriented X 3. Affect and behavior are appropriate  LABS/RADIOLOGY: Labs reviewed: Basic Metabolic Panel:  Recent Labs  05/01/15 08/06/15 12/24/15  NA 143 143 141  K 3.9 3.7 3.9  BUN 14 15 8   CREATININE 0.4* 0.4* 0.4*   Liver Function Tests:  Recent Labs  05/01/15 08/06/15 12/24/15  AST 10* 10* 14  ALT 7 7 13   ALKPHOS 58 55 50   CBC:  Recent Labs  05/01/15 08/06/15 12/24/15  WBC 5.0 5.9 4.6  NEUTROABS  --  3 2  HGB 11.9* 12.7 13.3  HCT 37 38 41  PLT 206 186 206     ASSESSMENT/PLAN:  Dry eyes - continue Refresh eye drops instill 1 gtt to both eyes QID  OAB - continue Myrbetriq  25 mg 1 tab by mouth Q HS   Neuropathy - continue Neurontin 200 mg  Q HS  Hypertension  - well-controlled; continue Norvasc 5 mg 1 tab by mouth daily  GERD - continue Prilosec 20 mg 1 capsule by mouth daily   ALS  - on hospice/comfort care; continue aspiration precaution  Anxiety - mood is stable; continue Ativan 0.5 mg 1 tab by mouth daily at bedtime  Closed right tibia  fracture -  continue Norco 5/325 mg 1 tab by mouth daily at bedtime and every 4 hours when necessary for pain, Acetaminophen 325 mg give 2 tabs = 650 mg PO Q 4 hours PRN    Vaginal atrophy - continue Premarin vaginal cream apply 1/2 gm vaginally twice weekly   Constipation - continue  MiraLAX 17 g by mouth @ HS and prune juice on breakfast trays  Insomnia - continue melatonin to 5 mg 1 tab by mouth daily at bedtime    Goals of care:  Long-term care/hospice care     Durenda Age, NP [place] [phone]

## 2016-02-22 DIAGNOSIS — E785 Hyperlipidemia, unspecified: Secondary | ICD-10-CM | POA: Diagnosis not present

## 2016-02-22 DIAGNOSIS — N952 Postmenopausal atrophic vaginitis: Secondary | ICD-10-CM | POA: Diagnosis not present

## 2016-02-22 DIAGNOSIS — J309 Allergic rhinitis, unspecified: Secondary | ICD-10-CM | POA: Diagnosis not present

## 2016-02-22 DIAGNOSIS — I10 Essential (primary) hypertension: Secondary | ICD-10-CM | POA: Diagnosis not present

## 2016-02-22 DIAGNOSIS — C4490 Unspecified malignant neoplasm of skin, unspecified: Secondary | ICD-10-CM | POA: Diagnosis not present

## 2016-02-22 DIAGNOSIS — M2450 Contracture, unspecified joint: Secondary | ICD-10-CM | POA: Diagnosis not present

## 2016-02-22 DIAGNOSIS — E46 Unspecified protein-calorie malnutrition: Secondary | ICD-10-CM | POA: Diagnosis not present

## 2016-02-22 DIAGNOSIS — H3530 Unspecified macular degeneration: Secondary | ICD-10-CM | POA: Diagnosis not present

## 2016-02-22 DIAGNOSIS — R471 Dysarthria and anarthria: Secondary | ICD-10-CM | POA: Diagnosis not present

## 2016-02-22 DIAGNOSIS — R0600 Dyspnea, unspecified: Secondary | ICD-10-CM | POA: Diagnosis not present

## 2016-02-22 DIAGNOSIS — G1221 Amyotrophic lateral sclerosis: Secondary | ICD-10-CM | POA: Diagnosis not present

## 2016-02-22 DIAGNOSIS — R1310 Dysphagia, unspecified: Secondary | ICD-10-CM | POA: Diagnosis not present

## 2016-02-22 DIAGNOSIS — N3281 Overactive bladder: Secondary | ICD-10-CM | POA: Diagnosis not present

## 2016-02-23 DIAGNOSIS — R1310 Dysphagia, unspecified: Secondary | ICD-10-CM | POA: Diagnosis not present

## 2016-02-23 DIAGNOSIS — N952 Postmenopausal atrophic vaginitis: Secondary | ICD-10-CM | POA: Diagnosis not present

## 2016-02-23 DIAGNOSIS — J309 Allergic rhinitis, unspecified: Secondary | ICD-10-CM | POA: Diagnosis not present

## 2016-02-23 DIAGNOSIS — G1221 Amyotrophic lateral sclerosis: Secondary | ICD-10-CM | POA: Diagnosis not present

## 2016-02-23 DIAGNOSIS — R471 Dysarthria and anarthria: Secondary | ICD-10-CM | POA: Diagnosis not present

## 2016-02-23 DIAGNOSIS — N3281 Overactive bladder: Secondary | ICD-10-CM | POA: Diagnosis not present

## 2016-02-23 DIAGNOSIS — E46 Unspecified protein-calorie malnutrition: Secondary | ICD-10-CM | POA: Diagnosis not present

## 2016-02-23 DIAGNOSIS — H3530 Unspecified macular degeneration: Secondary | ICD-10-CM | POA: Diagnosis not present

## 2016-02-23 DIAGNOSIS — I10 Essential (primary) hypertension: Secondary | ICD-10-CM | POA: Diagnosis not present

## 2016-02-23 DIAGNOSIS — R0600 Dyspnea, unspecified: Secondary | ICD-10-CM | POA: Diagnosis not present

## 2016-02-23 DIAGNOSIS — C4490 Unspecified malignant neoplasm of skin, unspecified: Secondary | ICD-10-CM | POA: Diagnosis not present

## 2016-02-23 DIAGNOSIS — E785 Hyperlipidemia, unspecified: Secondary | ICD-10-CM | POA: Diagnosis not present

## 2016-02-23 DIAGNOSIS — M2450 Contracture, unspecified joint: Secondary | ICD-10-CM | POA: Diagnosis not present

## 2016-02-24 DIAGNOSIS — H3530 Unspecified macular degeneration: Secondary | ICD-10-CM | POA: Diagnosis not present

## 2016-02-24 DIAGNOSIS — C4490 Unspecified malignant neoplasm of skin, unspecified: Secondary | ICD-10-CM | POA: Diagnosis not present

## 2016-02-24 DIAGNOSIS — N3281 Overactive bladder: Secondary | ICD-10-CM | POA: Diagnosis not present

## 2016-02-24 DIAGNOSIS — E46 Unspecified protein-calorie malnutrition: Secondary | ICD-10-CM | POA: Diagnosis not present

## 2016-02-24 DIAGNOSIS — R0600 Dyspnea, unspecified: Secondary | ICD-10-CM | POA: Diagnosis not present

## 2016-02-24 DIAGNOSIS — M2450 Contracture, unspecified joint: Secondary | ICD-10-CM | POA: Diagnosis not present

## 2016-02-24 DIAGNOSIS — I10 Essential (primary) hypertension: Secondary | ICD-10-CM | POA: Diagnosis not present

## 2016-02-24 DIAGNOSIS — R1310 Dysphagia, unspecified: Secondary | ICD-10-CM | POA: Diagnosis not present

## 2016-02-24 DIAGNOSIS — G1221 Amyotrophic lateral sclerosis: Secondary | ICD-10-CM | POA: Diagnosis not present

## 2016-02-24 DIAGNOSIS — N952 Postmenopausal atrophic vaginitis: Secondary | ICD-10-CM | POA: Diagnosis not present

## 2016-02-24 DIAGNOSIS — E785 Hyperlipidemia, unspecified: Secondary | ICD-10-CM | POA: Diagnosis not present

## 2016-02-24 DIAGNOSIS — J309 Allergic rhinitis, unspecified: Secondary | ICD-10-CM | POA: Diagnosis not present

## 2016-02-24 DIAGNOSIS — R471 Dysarthria and anarthria: Secondary | ICD-10-CM | POA: Diagnosis not present

## 2016-02-25 DIAGNOSIS — G1221 Amyotrophic lateral sclerosis: Secondary | ICD-10-CM | POA: Diagnosis not present

## 2016-02-25 DIAGNOSIS — H3530 Unspecified macular degeneration: Secondary | ICD-10-CM | POA: Diagnosis not present

## 2016-02-25 DIAGNOSIS — R0600 Dyspnea, unspecified: Secondary | ICD-10-CM | POA: Diagnosis not present

## 2016-02-25 DIAGNOSIS — R1310 Dysphagia, unspecified: Secondary | ICD-10-CM | POA: Diagnosis not present

## 2016-02-25 DIAGNOSIS — N952 Postmenopausal atrophic vaginitis: Secondary | ICD-10-CM | POA: Diagnosis not present

## 2016-02-25 DIAGNOSIS — R471 Dysarthria and anarthria: Secondary | ICD-10-CM | POA: Diagnosis not present

## 2016-02-25 DIAGNOSIS — C4490 Unspecified malignant neoplasm of skin, unspecified: Secondary | ICD-10-CM | POA: Diagnosis not present

## 2016-02-25 DIAGNOSIS — I10 Essential (primary) hypertension: Secondary | ICD-10-CM | POA: Diagnosis not present

## 2016-02-25 DIAGNOSIS — E46 Unspecified protein-calorie malnutrition: Secondary | ICD-10-CM | POA: Diagnosis not present

## 2016-02-25 DIAGNOSIS — E785 Hyperlipidemia, unspecified: Secondary | ICD-10-CM | POA: Diagnosis not present

## 2016-02-25 DIAGNOSIS — N3281 Overactive bladder: Secondary | ICD-10-CM | POA: Diagnosis not present

## 2016-02-25 DIAGNOSIS — J309 Allergic rhinitis, unspecified: Secondary | ICD-10-CM | POA: Diagnosis not present

## 2016-02-25 DIAGNOSIS — M2450 Contracture, unspecified joint: Secondary | ICD-10-CM | POA: Diagnosis not present

## 2016-02-27 ENCOUNTER — Non-Acute Institutional Stay (SKILLED_NURSING_FACILITY): Payer: Medicare Other | Admitting: Adult Health

## 2016-02-27 ENCOUNTER — Encounter: Payer: Self-pay | Admitting: Adult Health

## 2016-02-27 DIAGNOSIS — G1221 Amyotrophic lateral sclerosis: Secondary | ICD-10-CM

## 2016-02-27 DIAGNOSIS — S82291S Other fracture of shaft of right tibia, sequela: Secondary | ICD-10-CM

## 2016-02-27 DIAGNOSIS — I10 Essential (primary) hypertension: Secondary | ICD-10-CM

## 2016-02-27 DIAGNOSIS — K219 Gastro-esophageal reflux disease without esophagitis: Secondary | ICD-10-CM

## 2016-02-27 DIAGNOSIS — F419 Anxiety disorder, unspecified: Secondary | ICD-10-CM

## 2016-02-27 DIAGNOSIS — K5900 Constipation, unspecified: Secondary | ICD-10-CM

## 2016-02-27 DIAGNOSIS — N952 Postmenopausal atrophic vaginitis: Secondary | ICD-10-CM

## 2016-02-27 DIAGNOSIS — H04123 Dry eye syndrome of bilateral lacrimal glands: Secondary | ICD-10-CM | POA: Diagnosis not present

## 2016-02-27 DIAGNOSIS — G4700 Insomnia, unspecified: Secondary | ICD-10-CM

## 2016-02-27 DIAGNOSIS — N3281 Overactive bladder: Secondary | ICD-10-CM | POA: Diagnosis not present

## 2016-02-27 DIAGNOSIS — G629 Polyneuropathy, unspecified: Secondary | ICD-10-CM | POA: Diagnosis not present

## 2016-02-27 NOTE — Progress Notes (Signed)
Patient ID: Ashley Vaughan, female   DOB: 1929/06/09, 82 y.o.   MRN: [id number]    DATE:    02/27/16  MRN:  [id number]  BIRTHDAY: March 19, 1929  Facility:  Nursing Home Location:  [place] Room Number: 406-A  LEVEL OF CARE:  SNF [phone])  Contact Information    Name Relation Home Work [place] Son [phone]  [place] Daughter [phone]        Code Status History    Date Active Date Inactive Code Status Order ID Comments User Context   11/30/2014  4:38 PM 12/03/2014  8:34 PM Full Code [id number]  Wylene Simmer, MD ED    Advance Directive Documentation        Most Recent Value   Type of Advance Directive  Out of facility DNR (pink MOST or yellow form)   Pre-existing out of facility DNR order (yellow form or pink MOST form)     "MOST" Form in Place?         Chief Complaint  Patient presents with  . Discharge Note    HISTORY OF PRESENT ILLNESS:   This is an 82 year old female who is for discharge to [facility]. She has ALS and it has been noted that she has been having more difficulty swallowing food. She was evaluated by speech therapy and was changed to honey-thick liquids and to be given very small bites of pureed food when eating. She was being followed by hospice while @ [facility]. Patient and family has requested for the transfer.    PAST MEDICAL HISTORY:  Past Medical History:  Diagnosis Date  . Allergy    s/p immunotherapy  . ALS (amyotrophic lateral sclerosis) ([place])   . Anxiety   . Atrophic vaginitis   . B12 deficiency   . BCC (basal cell carcinoma of skin) L cheek-DrLupton (6/09)  . Closed right tibial fracture   . Constipation   . Diverticulosis ([place]) 1994  . GERD (gastroesophageal reflux disease)    Esophagitis presence not specified  . Hearing loss    right ear  . Hematuria, microscopic DrKimbrough 1993  . Hiatal hernia   . Hyperlipidemia   . Hypertension   .  Insomnia   . Kidney stones, calcium oxalate 1993  . Macular degeneration   . Migraine (atenolol for prophylaxis)  . Mitral regurgitation    mild-mod; mild aortic regurgitation, and mild-mod TR  . Neuropathy ([place])   . OAB (overactive bladder)   . Osteoporosis   . Postmenopausal bleeding 2010-DrCole  . Postmenopausal HRT (hormone replacement therapy)   . Pulmonary hypertension (HCC) mild,mild AR,mild-mod MR,mild-mod TR     CURRENT MEDICATIONS: Reviewed  Patient's Medications  New Prescriptions   No medications on file  Previous Medications   ACETAMINOPHEN (TYLENOL) 325 MG TABLET    Take 650 mg by mouth every 4 (four) hours as needed for mild pain or fever (Fever >100).   AMLODIPINE (NORVASC) 5 MG TABLET    Take 1 tablet (5 mg total) by mouth daily.   CARBOXYMETHYLCELLULOSE SODIUM (REFRESH LIQUIGEL OP)    Apply 1 drop to eye 4 (four) times daily. OU    CONJUGATED ESTROGENS (PREMARIN) VAGINAL CREAM    Place 1 Applicatorful vaginally 2 (two) times a week.   GABAPENTIN (NEURONTIN) 100 MG CAPSULE    Take 200 mg by mouth at bedtime. Give two (2) 100 mg tablets to = 200 mg QHS   HYDROCODONE-ACETAMINOPHEN (  NORCO/VICODIN) 5-325 MG TABLET    Take 1 tablet by mouth every 4 (four) hours as needed for moderate pain.   HYDROCODONE-ACETAMINOPHEN (NORCO/VICODIN) 5-325 MG TABLET    Take 1 tablet by mouth at bedtime. Do not exceed 4 gm of Tylenol in 24 hours   LOPERAMIDE (IMODIUM) 2 MG CAPSULE    Take 2-4 mg by mouth as needed for diarrhea or loose stools. 2 caps to = 4 mg initially, followed by 2 mg after each episode of lose stool.  Not to exceed 16 mg in a 24-hour period.   LORAZEPAM (ATIVAN) 0.5 MG TABLET    Take 0.5 mg by mouth every 12 (twelve) hours.   LORAZEPAM (ATIVAN) 0.5 MG TABLET    Take 0.5 mg by mouth every 4 (four) hours as needed for anxiety.   MELATONIN 5 MG TABS    Take 5 mg by mouth at bedtime.   NYSTATIN (NYSTATIN) POWDER    Apply topically 2 (two) times daily. Apply to groin and inner  thigh BID   POLYETHYLENE GLYCOL (MIRALAX / GLYCOLAX) PACKET    Take 17 g by mouth daily as needed for mild constipation or moderate constipation.   SODIUM CHLORIDE (OCEAN) 0.65 % SOLN NASAL SPRAY    Place 1-2 sprays into both nostrils as needed for congestion.    UNABLE TO FIND    Med Name: Med Pass 90 mL by mouth twice daily for nutritional support  Modified Medications   No medications on file  Discontinued Medications   ASPIRIN 81 MG CHEWABLE TABLET    Chew 81 mg by mouth at bedtime.   CYANOCOBALAMIN (B-12) 5000 MCG SUBL    Place 1 tablet under the tongue daily.    LORAZEPAM (ATIVAN) 0.5 MG TABLET    Take 1 tablet by mouth every night at bedtime for anxiety   MIRABEGRON ER (MYRBETRIQ) 25 MG TB24 TABLET    Take 25 mg by mouth at bedtime.    MULTIPLE VITAMINS-MINERALS (OCUVITE EYE + MULTI) TABS    Take 2 tablets by mouth daily.    OMEPRAZOLE (PRILOSEC) 20 MG CAPSULE    Take 20 mg by mouth daily.      Allergies  Allergen Reactions  . Codeine Other (See Comments)    unknown  . Lexapro [Escitalopram] Nausea Only  . Macrobid [Nitrofurantoin Monohydrate Macrocrystals] Nausea Only    Nausea and headache  . Sulfa Antibiotics Other (See Comments)    dizziness     REVIEW OF SYSTEMS:  GENERAL: no change in appetite, no fatigue, no weight changes, no fever, chills or weakness EYES: Denies change in vision, dry eyes, eye pain, itching or discharge EARS: Denies change in hearing, ringing in ears, or earache NOSE: Denies nasal congestion or epistaxis MOUTH and THROAT: Denies oral discomfort, gingival pain or bleeding, pain from teeth or hoarseness   RESPIRATORY: no cough, SOB, DOE, wheezing, hemoptysis CARDIAC: no chest pain, edema or palpitations GI: no abdominal pain, diarrhea, constipation, heart burn, nausea or vomiting PSYCHIATRIC: Denies feeling of depression or anxiety. No report of hallucinations, insomnia, paranoia, or agitation   PHYSICAL EXAMINATION  GENERAL APPEARANCE: Well  nourished. In no acute distress. Normal body habitus SKIN:  Skin is warm and dry.  HEAD: Normal in size and contour. No evidence of trauma EYES: Lids open and close normally. No blepharitis, entropion or ectropion. PERRL. Conjunctivae are clear and sclerae are white. Lenses are without opacity EARS: Pinnae are normal. Patient hears normal voice tunes of the examiner MOUTH and  THROAT: Lips are without lesions. Oral mucosa is moist and without lesions. Tongue is normal in shape, size, and color and without lesions NECK: supple, trachea midline, no neck masses, no thyroid tenderness, no thyromegaly LYMPHATICS: no LAN in the neck, no supraclavicular LAN RESPIRATORY: breathing is even & unlabored, BS CTAB CARDIAC: RRR, no murmur,no extra heart sounds, no edema GI: abdomen soft, normal BS, no masses, no tenderness, no hepatomegaly, no splenomegaly EXTREMITIES:  Able to move X 4 extremities; generalized weakness BLE; contracted fingers on bilateral hands  NEURO:  Slurred speech PSYCHIATRIC: Alert and oriented X 3. Affect and behavior are appropriate  LABS/RADIOLOGY: Labs reviewed: Basic Metabolic Panel:  Recent Labs  05/01/15 08/06/15 12/24/15  NA 143 143 141  K 3.9 3.7 3.9  BUN 14 15 8   CREATININE 0.4* 0.4* 0.4*   Liver Function Tests:  Recent Labs  05/01/15 08/06/15 12/24/15  AST 10* 10* 14  ALT 7 7 13   ALKPHOS 58 55 50   CBC:  Recent Labs  05/01/15 08/06/15 12/24/15  WBC 5.0 5.9 4.6  NEUTROABS  --  3 2  HGB 11.9* 12.7 13.3  HCT 37 38 41  PLT 206 186 206     ASSESSMENT/PLAN:  Dry eyes - continue Refresh eye drops instill 1 gtt to both eyes QID  OAB - continue Myrbetriq  25 mg 1 tab by mouth Q HS   Neuropathy - continue Neurontin 200 mg  Q HS  Hypertension  - discontinue Norvasc  GERD - continue Prilosec 20 mg Q D   ALS  - will transfer to [facility];  aspiration precaution  Anxiety - discontinue Ativan tablet; start Ativan concentrate 2 mg/ml give 0.5 mg/0.25  ml SL Q 12 hours routinely and 0.5mg /0.25 ml SL Q 4 hours PRN  Closed right tibia  fracture -  Discontinue Norco and start Morphine 20 mg/ml give 2.5 mg/0.125 ml SL Q HS routinely and Q 4 hours PRN  Vaginal atrophy - continue Premarin vaginal cream apply 1/2 gm vaginally twice weekly   Constipation - discontinue  MiraLAX and start Dulcolax suppository 10 mg PR Q 3 days  Insomnia - discontinue melatonin     .  I have filled out patient's discharge paperwork and written prescriptions.  Patient will discharge to [facility].  DME provided:  None  Total discharge time: Less than 30 minutes  Discharge time involved coordination of the discharge process with Education officer, museum and nursing staff.       Durenda Age, NP [employer] [phone]

## 2016-03-15 DEATH — deceased

## 2016-11-17 ENCOUNTER — Telehealth: Payer: Self-pay | Admitting: Adult Health

## 2016-11-17 ENCOUNTER — Encounter: Payer: Self-pay | Admitting: Adult Health

## 2016-11-17 NOTE — Telephone Encounter (Signed)
Error telephone encounter

## 2016-11-17 NOTE — Progress Notes (Signed)
This encounter was created in error - please disregard.

## 2016-11-17 NOTE — Telephone Encounter (Deleted)
This encounter was created in error - please disregard.

## 2018-01-12 NOTE — Telephone Encounter (Signed)
error
